# Patient Record
Sex: Female | Born: 1955 | Race: Black or African American | Hispanic: No | Smoking: Current some day smoker
Health system: Southern US, Community
[De-identification: ages and names within clinical notes are randomized; demographics above are authoritative.]

## PROBLEM LIST (undated history)

## (undated) DIAGNOSIS — R0609 Other forms of dyspnea: Secondary | ICD-10-CM

## (undated) DIAGNOSIS — Z8639 Personal history of other endocrine, nutritional and metabolic disease: Secondary | ICD-10-CM

## (undated) DIAGNOSIS — I10 Essential (primary) hypertension: Secondary | ICD-10-CM

## (undated) DIAGNOSIS — R0600 Dyspnea, unspecified: Secondary | ICD-10-CM

## (undated) DIAGNOSIS — E079 Disorder of thyroid, unspecified: Secondary | ICD-10-CM

## (undated) DIAGNOSIS — J4489 Other specified chronic obstructive pulmonary disease: Secondary | ICD-10-CM

## (undated) DIAGNOSIS — Z972 Presence of dental prosthetic device (complete) (partial): Secondary | ICD-10-CM

## (undated) DIAGNOSIS — J449 Chronic obstructive pulmonary disease, unspecified: Secondary | ICD-10-CM

## (undated) DIAGNOSIS — G4762 Sleep related leg cramps: Secondary | ICD-10-CM

## (undated) DIAGNOSIS — G4734 Idiopathic sleep related nonobstructive alveolar hypoventilation: Secondary | ICD-10-CM

## (undated) DIAGNOSIS — G2581 Restless legs syndrome: Principal | ICD-10-CM

## (undated) HISTORY — PX: APPENDECTOMY: SHX54

## (undated) HISTORY — DX: Sleep related leg cramps: G47.62

## (undated) HISTORY — DX: Restless legs syndrome: G25.81

## (undated) HISTORY — PX: ABDOMINAL HYSTERECTOMY: SHX81

---

## 2005-09-11 DIAGNOSIS — I10 Essential (primary) hypertension: Secondary | ICD-10-CM | POA: Insufficient documentation

## 2008-06-04 ENCOUNTER — Emergency Department (HOSPITAL_COMMUNITY): Admission: EM | Admit: 2008-06-04 | Discharge: 2008-06-04 | Payer: Self-pay | Admitting: Emergency Medicine

## 2008-08-18 ENCOUNTER — Emergency Department (HOSPITAL_COMMUNITY): Admission: EM | Admit: 2008-08-18 | Discharge: 2008-08-18 | Payer: Self-pay | Admitting: Emergency Medicine

## 2008-11-20 ENCOUNTER — Ambulatory Visit: Payer: Self-pay | Admitting: Nurse Practitioner

## 2008-11-20 DIAGNOSIS — F329 Major depressive disorder, single episode, unspecified: Secondary | ICD-10-CM

## 2008-11-20 DIAGNOSIS — J45909 Unspecified asthma, uncomplicated: Secondary | ICD-10-CM | POA: Insufficient documentation

## 2008-11-20 DIAGNOSIS — F17200 Nicotine dependence, unspecified, uncomplicated: Secondary | ICD-10-CM | POA: Insufficient documentation

## 2008-11-20 DIAGNOSIS — R252 Cramp and spasm: Secondary | ICD-10-CM

## 2008-11-20 LAB — CONVERTED CEMR LAB
Albumin: 4.2 g/dL (ref 3.5–5.2)
Alkaline Phosphatase: 84 units/L (ref 39–117)
BUN: 12 mg/dL (ref 6–23)
Basophils Absolute: 0 10*3/uL (ref 0.0–0.1)
Basophils Relative: 1 % (ref 0–1)
CO2: 23 meq/L (ref 19–32)
Cholesterol: 168 mg/dL (ref 0–200)
Eosinophils Relative: 5 % (ref 0–5)
Glucose, Bld: 83 mg/dL (ref 70–99)
HCT: 44.5 % (ref 39.0–52.0)
HDL: 53 mg/dL (ref >39)
Hemoglobin: 14.4 g/dL (ref 13.0–17.0)
LDL Cholesterol: 102 mg/dL — ABNORMAL HIGH (ref 0–99)
Lymphocytes Relative: 57 % — ABNORMAL HIGH (ref 12–46)
MCV: 89.9 fL (ref 78.0–100.0)
Monocytes Absolute: 0.4 10*3/uL (ref 0.1–1.0)
Monocytes Relative: 6 % (ref 3–12)
Platelets: 329 10*3/uL (ref 150–400)
Potassium: 3.9 meq/L (ref 3.5–5.3)
RDW: 13.1 % (ref 11.5–15.5)
Sodium: 140 meq/L (ref 135–145)
Total Protein: 6.8 g/dL (ref 6.0–8.3)
Triglycerides: 63 mg/dL (ref <150)

## 2008-11-23 ENCOUNTER — Ambulatory Visit: Payer: Self-pay | Admitting: *Deleted

## 2008-11-26 ENCOUNTER — Encounter (INDEPENDENT_AMBULATORY_CARE_PROVIDER_SITE_OTHER): Payer: Self-pay | Admitting: Nurse Practitioner

## 2008-12-04 ENCOUNTER — Ambulatory Visit: Payer: Self-pay | Admitting: Nurse Practitioner

## 2008-12-30 ENCOUNTER — Ambulatory Visit: Payer: Self-pay | Admitting: Nurse Practitioner

## 2009-02-26 ENCOUNTER — Telehealth (INDEPENDENT_AMBULATORY_CARE_PROVIDER_SITE_OTHER): Payer: Self-pay | Admitting: Nurse Practitioner

## 2009-03-01 ENCOUNTER — Ambulatory Visit: Payer: Self-pay | Admitting: Nurse Practitioner

## 2009-03-01 DIAGNOSIS — B351 Tinea unguium: Secondary | ICD-10-CM

## 2009-03-25 ENCOUNTER — Ambulatory Visit: Payer: Self-pay | Admitting: Nurse Practitioner

## 2009-03-25 ENCOUNTER — Encounter (INDEPENDENT_AMBULATORY_CARE_PROVIDER_SITE_OTHER): Payer: Self-pay | Admitting: Nurse Practitioner

## 2009-03-25 LAB — CONVERTED CEMR LAB
Cholesterol, target level: 200 mg/dL
GC Probe Amp, Genital: NEGATIVE
HDL goal, serum: 40 mg/dL
KOH Prep: NEGATIVE
LDL Goal: 130 mg/dL
Nitrite: NEGATIVE
OCCULT 1: NEGATIVE
Specific Gravity, Urine: 1.025
Urobilinogen, UA: 0.2
WBC Urine, dipstick: NEGATIVE

## 2009-04-01 ENCOUNTER — Ambulatory Visit (HOSPITAL_COMMUNITY): Admission: RE | Admit: 2009-04-01 | Discharge: 2009-04-01 | Payer: Self-pay | Admitting: Internal Medicine

## 2009-06-03 ENCOUNTER — Ambulatory Visit: Payer: Self-pay | Admitting: Nurse Practitioner

## 2009-06-21 ENCOUNTER — Ambulatory Visit: Payer: Self-pay | Admitting: Nurse Practitioner

## 2009-07-09 ENCOUNTER — Ambulatory Visit: Payer: Self-pay | Admitting: Nurse Practitioner

## 2009-07-16 ENCOUNTER — Telehealth (INDEPENDENT_AMBULATORY_CARE_PROVIDER_SITE_OTHER): Payer: Self-pay | Admitting: Nurse Practitioner

## 2009-07-26 ENCOUNTER — Emergency Department (HOSPITAL_COMMUNITY): Admission: EM | Admit: 2009-07-26 | Discharge: 2009-07-26 | Payer: Self-pay | Admitting: Emergency Medicine

## 2009-09-07 ENCOUNTER — Telehealth (INDEPENDENT_AMBULATORY_CARE_PROVIDER_SITE_OTHER): Payer: Self-pay | Admitting: Nurse Practitioner

## 2009-11-08 ENCOUNTER — Emergency Department (HOSPITAL_COMMUNITY): Admission: EM | Admit: 2009-11-08 | Discharge: 2009-11-08 | Payer: Self-pay | Admitting: Family Medicine

## 2009-11-08 ENCOUNTER — Telehealth (INDEPENDENT_AMBULATORY_CARE_PROVIDER_SITE_OTHER): Payer: Self-pay | Admitting: Nurse Practitioner

## 2009-11-12 ENCOUNTER — Ambulatory Visit: Payer: Self-pay | Admitting: Internal Medicine

## 2009-11-19 ENCOUNTER — Ambulatory Visit: Payer: Self-pay | Admitting: Internal Medicine

## 2009-11-19 ENCOUNTER — Encounter (INDEPENDENT_AMBULATORY_CARE_PROVIDER_SITE_OTHER): Payer: Self-pay | Admitting: Nurse Practitioner

## 2010-02-05 ENCOUNTER — Emergency Department (HOSPITAL_COMMUNITY): Admission: EM | Admit: 2010-02-05 | Discharge: 2010-02-05 | Payer: Self-pay | Admitting: Family Medicine

## 2010-04-06 ENCOUNTER — Ambulatory Visit: Payer: Self-pay | Admitting: Nurse Practitioner

## 2010-04-06 DIAGNOSIS — M65849 Other synovitis and tenosynovitis, unspecified hand: Secondary | ICD-10-CM

## 2010-04-06 DIAGNOSIS — M65839 Other synovitis and tenosynovitis, unspecified forearm: Secondary | ICD-10-CM | POA: Insufficient documentation

## 2010-04-07 ENCOUNTER — Ambulatory Visit (HOSPITAL_COMMUNITY): Admission: RE | Admit: 2010-04-07 | Discharge: 2010-04-07 | Payer: Self-pay | Admitting: Internal Medicine

## 2010-06-12 ENCOUNTER — Emergency Department (HOSPITAL_COMMUNITY): Admission: EM | Admit: 2010-06-12 | Discharge: 2010-06-12 | Payer: Self-pay | Admitting: Emergency Medicine

## 2010-06-13 ENCOUNTER — Telehealth (INDEPENDENT_AMBULATORY_CARE_PROVIDER_SITE_OTHER): Payer: Self-pay | Admitting: Nurse Practitioner

## 2010-06-14 ENCOUNTER — Emergency Department (HOSPITAL_COMMUNITY): Admission: EM | Admit: 2010-06-14 | Discharge: 2010-06-14 | Payer: Self-pay | Admitting: Family Medicine

## 2010-06-14 ENCOUNTER — Emergency Department (HOSPITAL_COMMUNITY): Admission: EM | Admit: 2010-06-14 | Discharge: 2010-06-15 | Payer: Self-pay | Admitting: Emergency Medicine

## 2010-06-16 ENCOUNTER — Ambulatory Visit: Payer: Self-pay | Admitting: Nurse Practitioner

## 2010-06-16 DIAGNOSIS — M436 Torticollis: Secondary | ICD-10-CM | POA: Insufficient documentation

## 2010-06-16 DIAGNOSIS — S91309A Unspecified open wound, unspecified foot, initial encounter: Secondary | ICD-10-CM | POA: Insufficient documentation

## 2010-06-22 ENCOUNTER — Emergency Department (HOSPITAL_COMMUNITY): Admission: EM | Admit: 2010-06-22 | Discharge: 2010-06-22 | Payer: Self-pay | Admitting: Emergency Medicine

## 2010-06-23 ENCOUNTER — Telehealth (INDEPENDENT_AMBULATORY_CARE_PROVIDER_SITE_OTHER): Payer: Self-pay | Admitting: Nurse Practitioner

## 2010-06-24 ENCOUNTER — Telehealth (INDEPENDENT_AMBULATORY_CARE_PROVIDER_SITE_OTHER): Payer: Self-pay | Admitting: Nurse Practitioner

## 2010-06-25 ENCOUNTER — Encounter (INDEPENDENT_AMBULATORY_CARE_PROVIDER_SITE_OTHER): Payer: Self-pay | Admitting: Nurse Practitioner

## 2010-06-30 ENCOUNTER — Ambulatory Visit: Payer: Self-pay | Admitting: Nurse Practitioner

## 2010-06-30 DIAGNOSIS — F341 Dysthymic disorder: Secondary | ICD-10-CM | POA: Insufficient documentation

## 2010-06-30 DIAGNOSIS — R569 Unspecified convulsions: Secondary | ICD-10-CM | POA: Insufficient documentation

## 2010-07-07 ENCOUNTER — Ambulatory Visit: Payer: Self-pay | Admitting: Nurse Practitioner

## 2010-07-26 ENCOUNTER — Telehealth (INDEPENDENT_AMBULATORY_CARE_PROVIDER_SITE_OTHER): Payer: Self-pay | Admitting: Nurse Practitioner

## 2010-07-29 ENCOUNTER — Ambulatory Visit: Payer: Self-pay | Admitting: Nurse Practitioner

## 2010-07-29 ENCOUNTER — Telehealth (INDEPENDENT_AMBULATORY_CARE_PROVIDER_SITE_OTHER): Payer: Self-pay | Admitting: Nurse Practitioner

## 2010-07-29 DIAGNOSIS — J209 Acute bronchitis, unspecified: Secondary | ICD-10-CM

## 2010-08-18 ENCOUNTER — Ambulatory Visit: Payer: Self-pay | Admitting: Nurse Practitioner

## 2010-10-11 NOTE — Letter (Signed)
Summary: [facility]  [facility]   Imported By: Arta Bruce 06/29/2010 11:37:16  _____________________________________________________________________  External Attachment:    Type:   Image     Comment:   External Document

## 2010-10-11 NOTE — Letter (Signed)
Summary: [facility] AND SCHOOL EXCUSE  [facility] AND S[facility]OL EXCUSE   Imported By: Arta Bruce 01/17/2010 16:25:12  _____________________________________________________________________  External Attachment:    Type:   Image     Comment:   External Document

## 2010-10-11 NOTE — Assessment & Plan Note (Signed)
Summary: Left Wrist tendinitis   Vital Signs:  Patient profile:   55 year old female Menstrual status:  hysterectomy Weight:      205.4 pounds BMI:     35.38 Temp:     98.2 degrees F oral Pulse rate:   78 / minute Pulse rhythm:   regular Resp:     20 per minute BP sitting:   169 / 94  (left arm) Cuff size:   regular  Vitals Entered By: Levon Hedger (April 06, 2010 9:32 AM) CC: left hand painful feels like it has a knot in it x 3 months...pt states she keeps getting a water blister on big toes that leaves and comes back x 1 week, Hypertension Management Is Patient Diabetic? No Pain Assessment Patient in pain? yes     Location: hand Intensity: 9 Onset of pain  Constant  Does patient need assistance? Functional Status Self care Ambulation Normal   CC:  left hand painful feels like it has a knot in it x 3 months...pt states she keeps getting a water blister on big toes that leaves and comes back x 1 week and Hypertension Management.  History of Present Illness:  Pt into the office with complaints of pain in her left hand. Pain started 3 months ago. She went to the Urgent Care about 1-2 months ago and was dx with a wrist sprain.  She was ordered ibuprofen which did not help.  Pt was not given a splint. Pt is employed at [employer] and pt was concerned back in April that she had injuried her wrist at work.  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 Pt is right handed. She notices pain more when she tries to sleep at night or when she goes to pick up an object Fingers are not affected.    Hypertension History:      She denies headache, chest pain, and palpitations.  She notes no problems with any antihypertensive medication side effects.        Positive major cardiovascular risk factors include hypertension and current tobacco user.  Negative major cardiovascular risk factors include female age less than 49 years old.        Further assessment  for target organ damage reveals no history of ASHD, cardiac end-organ damage (CHF/LVH), stroke/TIA, peripheral vascular disease, renal insufficiency, or hypertensive retinopathy.     Habits & Providers  Alcohol-Tobacco-Diet     Alcohol drinks/day: 0     Tobacco Status: current     Tobacco Counseling: to quit use of tobacco products     Year Started: 2007  Exercise-Depression-Behavior     Does Patient Exercise: no     Exercise Counseling: to improve exercise regimen     STD Risk: past     STD Risk Counseling: to avoid increased STD risk     Contraception Counseling: not indicated; no questions/concerns expressed     Drug Use: never     Seat Belt Use: always     Sun Exposure: frequent  Comments: Pt quit smoking for 3 months then her brother died and she restarrted.  Allergies (verified): No Known Drug Allergies  Social History: STD Risk:  past  Review of Systems CV:  Denies chest pain or discomfort. Resp:  Denies cough. GI:  Denies abdominal pain, nausea, and vomiting. MS:  Complains of joint pain and cramps; nortriptiline 10mg  is no longer helpful for cramps.  Physical Exam  General:  alert.   Head:  normocephalic.  Lungs:  scattered rhonchi Heart:  normal rate and regular rhythm.   Msk:  up to the exam table Neurologic:  alert & oriented X3.   Psych:  Oriented X3.     Wrist/Hand Exam  Hand Exam:    Right:    Inspection:  Normal       Location:  5th metacarpal    Tenderness:  5th metacarpal    Swelling:  5th metacarpal    Erythema:  no   Impression & Recommendations:  Problem # 1:  TENDINITIS, LEFT WRIST (ICD-727.05) advised pt to start wearing left wrist splint (she already has) start taking aniti-inflammatories  Problem # 2:  LEG CRAMPS (ICD-729.82) advised pt to increase medications to 25mg  by mouth night  Problem # 3:  TOBACCO ABUSE (ICD-305.1) advised cessation  Problem # 4:  HYPERTENSION, BENIGN ESSENTIAL (ICD-401.1) BP elevated today but  pt has not taken meds will have her return for triage visit Her updated medication list for this problem includes:    Metoprolol Succinate 50 Mg Xr24h-tab (Metoprolol succinate) .Marland Kitchen... 1 tablet by mouth daily for blood pressure    Hydrochlorothiazide 25 Mg Tabs (Hydrochlorothiazide) .Marland Kitchen... Take one (1) by mouth daily for blood pressure  Complete Medication List: 1)  Combivent 103-18 Mcg/act Aero (Ipratropium-albuterol) .... 2 puffs every 6 hours for shortness of breath 2)  Metoprolol Succinate 50 Mg Xr24h-tab (Metoprolol succinate) .Marland Kitchen.. 1 tablet by mouth daily for blood pressure 3)  Hydrochlorothiazide 25 Mg Tabs (Hydrochlorothiazide) .... Take one (1) by mouth daily for blood pressure 4)  Lexapro 10 Mg Tabs (Escitalopram oxalate) .... One tablet by mouth daily 5)  Advair Diskus 100-50 Mcg/dose Aepb (Fluticasone-salmeterol) .... One inhalation two times a day 6)  Nortriptyline Hcl 25 Mg Caps (Nortriptyline hcl) .... One capsule by mouth nightly as needed for cramps 7)  Loratadine 10 Mg Tabs (Loratadine) .... One tablet by mouth daily 8)  Fluticasone Propionate 50 Mcg/act Susp (Fluticasone propionate) .... One spray in each nostril two times a day (hold head down) 9)  Albuterol Sulfate (2.5 Mg/80ml) 0.083% Nebu (Albuterol sulfate) .... Nebulize every 4 hours as needed--pt. has medication from a previous provider 10)  Aleve 220 Mg Tabs (Naproxen sodium) .... 2 tablets by mouth daily for 3 days then as needed 11)  Naprosyn 500 Mg Tabs (Naproxen) .... One tablet by mouth two times a day as needed for pain  Hypertension Assessment/Plan:      The patient's hypertensive risk group is category B: At least one risk factor (excluding diabetes) with no target organ damage.  Her calculated 10 year risk of coronary heart disease is 15 %.  Today's blood pressure is 169/94.  Her blood pressure goal is < 140/90.  Patient Instructions: 1)  Leg cramps - You can take nortriptyline 10mg  - 2 capsules by mouth nightly  for cramps.  Increase nortriptyline to 25mg  by mouth nightly as needed for cramps. 2)  Blood pressure - high today. 3)  Left wrist - likely tendinitis.  The tendon in your left wrist is inflammed.  This is likely due to overuse over long periods of time such as at work.  WEAR YOUR WRIST SPLINT. 4)  take aleve - 2 tablets by mouth daily for 3 days THEN get naprosyn 500mg  by mouth two times a day as needed for pain (take with food) 5)  Schedule a nurse visit with the Triage Nurse - Aggie Cosier in 2-3 weeks for a blood pressure check. You should take your blood pressure medication  before this visit.   6)  Blood pressure goal: < 140/85 7)  If higher than this will need medication adjustment. Prescriptions: NAPROSYN 500 MG TABS (NAPROXEN) One tablet by mouth two times a day as needed for pain  #50 x 0   Entered and Authorized by:   Lehman Prom FNP   Signed by:   Lehman Prom FNP on 04/06/2010   Method used:   Print then Give to Patient   RxID:   [id number] ALEVE 220 MG TABS (NAPROXEN SODIUM) 2 tablets by mouth daily for 3 days then as needed  #6 x 0   Entered and Authorized by:   Lehman Prom FNP   Signed by:   Lehman Prom FNP on 04/06/2010   Method used:   Samples Given   RxID:   [id number] NORTRIPTYLINE HCL 25 MG CAPS (NORTRIPTYLINE HCL) One capsule by mouth nightly as needed for cramps  #30 x 0   Entered and Authorized by:   Lehman Prom FNP   Signed by:   Lehman Prom FNP on 04/06/2010   Method used:   Print then Give to Patient   RxID:   [id number]

## 2010-10-11 NOTE — Letter (Signed)
Summary: Handout Printed  Printed Handout:  - Tendinitis

## 2010-10-11 NOTE — Letter (Signed)
Summary: Work Excuse  [facility]  [address] [place], [place] [address]   Phone: [phone]  Fax: [phone]    Today's Date: June 16, 2010  Name of Patient: [name]  The above named patient had a medical visit today   Please take this into consideration when reviewing the time away from work  Special Instructions:  [  ] None  [ X] To be off the remainder of today, returning to the normal work  schedule Sunday October 9th, 2011.  [  ] To be off until the next scheduled appointment on ______________________.  [  ] Other ________________________________________________________________ ________________________________________________________________________   Sincerely yours,   Lehman Prom FNP

## 2010-10-11 NOTE — Progress Notes (Signed)
Summary: Cough and other symptoms persist  Phone Note Call from Patient   Summary of Call: Still has cough, coughing up yellowish mucus, some blood noted.  States she's not feeling any better at all.  Did all home management therapies suggested with no relief.  Vomiting stopped, having loose stools again.  Having fever only at night. Wants to be seen.  Advised that there are no appointments available; name is on list to be called if one opens up.  To continue with home remedies in the meantime.  If symptoms worsen, to consider UC for evaluation and treatment. Initial call taken by: Dutch Quint RN,  July 29, 2010 12:10 PM  Follow-up for Phone Call        In office for appointment.  Dutch Quint RN  July 29, 2010 3:12 PM

## 2010-10-11 NOTE — Assessment & Plan Note (Signed)
Summary: F/u ER visit   History of Present Illness:  Pt into the office for f/u on ER visit on 06/25/2010. Pt presents today with a family member who states they became concerned with pt's erratic behavior so they took her to the ER. Pt was dx with pseudoseizures and was advised to contact the [facility].    Depression History:      The patient is having a depressed mood most of the day and has a diminished interest in her usual daily activities.  Positive alarm features for depression include insomnia and fatigue (loss of energy).  However, she denies recurrent thoughts of death or suicide.        Psychosocial stress factors include the recent death of a loved one and major life changes.  The patient denies that she feels like life is not worth living, denies that she wishes that she were dead, and denies that she has thought about ending her life.         Depression Treatment History:  Prior Medication Used:   Start Date: Assessment of Effect:   Comments:  lexapro      11/20/2008   some improvement     increase dose  Hypertension History:      Positive major cardiovascular risk factors include hypertension and current tobacco user.  Negative major cardiovascular risk factors include female age less than 42 years old.        Further assessment for target organ damage reveals no history of ASHD, cardiac end-organ damage (CHF/LVH), stroke/TIA, peripheral vascular disease, renal insufficiency, or hypertensive retinopathy.      Allergies: No Known Drug Allergies  Review of Systems CV:  Denies chest pain or discomfort. Resp:  Denies cough. GI:  Denies abdominal pain, nausea, and vomiting. Neuro:  Complains of seizures; Family reports faining episodes and pt shakes uncontrollable.  Marland Kitchen  Physical Exam  General:  alert.   Head:  normocephalic.   Neck:  ROM intact Heart:  normal rate and regular rhythm.   Neurologic:  alert & oriented X3.   Psych:  jerking movement of neck with  ROM Rest of body not affected   Impression & Recommendations:  Problem # 1:  OTHER CONVULSIONS (ICD-780.39) advised pt to stay out of work until mental health evaluation done she will see Aquilla Solian in this office on next week Valium nightly for rest and relaxation   Problem # 2:  DEPRESSION/ANXIETY (ICD-300.4) increase lexapro to 20mg  by mouth daily Orders: Misc. Referral (Misc. Ref)  Complete Medication List: 1)  Combivent 103-18 Mcg/act Aero (Ipratropium-albuterol) .... 2 puffs every 6 hours for shortness of breath 2)  Metoprolol Succinate 50 Mg Xr24h-tab (Metoprolol succinate) .Marland Kitchen.. 1 tablet by mouth daily for blood pressure 3)  Hydrochlorothiazide 25 Mg Tabs (Hydrochlorothiazide) .... Take one (1) by mouth daily for blood pressure 4)  Lexapro 10 Mg Tabs (Escitalopram oxalate) .... Two tablets by mouth daily 5)  Advair Diskus 100-50 Mcg/dose Aepb (Fluticasone-salmeterol) .... One inhalation two times a day 6)  Nortriptyline Hcl 25 Mg Caps (Nortriptyline hcl) .... One capsule by mouth nightly as needed for cramps 7)  Loratadine 10 Mg Tabs (Loratadine) .... One tablet by mouth daily 8)  Fluticasone Propionate 50 Mcg/act Susp (Fluticasone propionate) .... One spray in each nostril two times a day (hold head down) 9)  Albuterol Sulfate (2.5 Mg/40ml) 0.083% Nebu (Albuterol sulfate) .... Nebulize every 4 hours as needed--pt. has medication from a previous provider 10)  Aleve 220 Mg Tabs (Naproxen  sodium) .... 2 tablets by mouth daily for 3 days then as needed 11)  Naprosyn 500 Mg Tabs (Naproxen) .... One tablet by mouth two times a day as needed for pain 12)  Diazepam 5 Mg Tabs (Diazepam) .... One tablet by mouth nightly for nerves  Hypertension Assessment/Plan:      The patient's hypertensive risk group is category B: At least one risk factor (excluding diabetes) with no target organ damage.  Her calculated 10 year risk of coronary heart disease is 9 %.  Her blood pressure goal is <  140/90.  Patient Instructions: 1)  Schedule an appointment with Aquilla Solian (see if you can get an appointment next week) 2)  Increase lexapro to 20mg  by mouth daily. 3)  If you start shaking then take some of the muscle relaxer. Prescriptions: DIAZEPAM 5 MG TABS (DIAZEPAM) One tablet by mouth nightly for nerves  #10 x 0   Entered and Authorized by:   Lehman Prom FNP   Signed by:   Lehman Prom FNP on 06/30/2010   Method used:   Print then Give to Patient   RxID:   [id number]    Orders Added: 1)  Est. Patient Level III [21308] 2)  Misc. Referral [Misc. Ref]

## 2010-10-11 NOTE — Progress Notes (Signed)
Summary: **FLU SYMPTOMS**  Phone Note Call from Patient Call back at [phone]   Summary of Call: pt has been sick since yesterday. pt c/o cough and fever 100.1. she is not bringing anything up..c/o cold and hot chills, body aches, has taken otc meds but no relief. pt uses [facility] .  patient states she is not sure if its sinus infection but she is not having any sinus pressure or nasal drainage at this time. Initial call taken by: Mikey College CMA,  November 08, 2009 2:35 PM  Follow-up for Phone Call        pt should take tylenol alternating with ibuprofen for aches and fever She should take coricidan HBP for cough given her high blood pressure (this is over the counter no Rx needed) No need for Rx plenty of rest, liquids - soup, gatorade for hydration - appetite is likely gone so she needs to avoid dehydration contagious so she will need to isolate herself in a separate room as to not spread infection  Follow-up by: Lehman Prom FNP,  November 08, 2009 3:03 PM  Additional Follow-up for Phone Call Additional follow up Details #1::        Levon Hedger  November 08, 2009 4:06 PM Called [phone] left message on machine for pt to return call to the office.  Levon Hedger  November 09, 2009 2:34 PM called [phone] left message with pt's daughter to return call to the office  Levon Hedger  November 10, 2009 12:22 PM Spoke with pt she says that she went to [facility] urgent care on Monday she was diagnosed with the flu and is being treated.  I shared with her to call us when she is feeling better or if she get worse to follow up with her to make sure she is over her illness.  Will pull up note from E-charts.    Additional Follow-up for Phone Call Additional follow up Details #2::    I see that pt has been to the ER and was treated for influenza virus. she should take meds as advised.  Meds will not CURE pt but only shorten the length of illness Avoid contact with others while she is  sick plenty of fluids to maintain hydration Follow-up by: Lehman Prom FNP,  November 11, 2009 8:32 AM  Additional Follow-up for Phone Call Additional follow up Details #3:: Details for Additional Follow-up Action Taken: pt informed. Additional Follow-up by: Levon Hedger,  November 11, 2009 4:48 PM

## 2010-10-11 NOTE — Assessment & Plan Note (Signed)
Summary: 1 week Follow up   Vital Signs:  Patient profile:   55 year old female Menstrual status:  hysterectomy Weight:      181.1 pounds BMI:     31.20 Temp:     97.3 degrees F oral Pulse rate:   80 / minute Pulse rhythm:   regular Resp:     20 per minute BP sitting:   130 / 70  (left arm) Cuff size:   regular  Vitals Entered By: Levon Hedger (July 07, 2010 3:14 PM)  Nutrition Counseling: Patient's BMI is greater than 25 and therefore counseled on weight management options. CC: follow-up visit...still shaking, Hypertension Management Is Patient Diabetic? No Pain Assessment Patient in pain? no       Does patient need assistance? Functional Status Self care Ambulation Normal   CC:  follow-up visit...still shaking and Hypertension Management.  History of Present Illness:  Pt into the office for 1 week follow up.  Pseudoseizure - Pt was seen in this office on last week for ER f/u for pseudoseizures. She was advised to rest and relax. She also takes valium at night which she also reports makes her very sleepy but relaxers her. Lexapro 10mg  was increased 2 tablets by mouth nightly.  Pt was not able to tolerate due to sedation. Full workup done at [facility] Pt's daughter has epilepsy  Social - pt reports that she was displaced from her job today due to lack of attendance for the past few weeks due to current illness.  Hypertension History:      She denies headache, chest pain, and palpitations.  She notes no problems with any antihypertensive medication side effects.        Positive major cardiovascular risk factors include hypertension and current tobacco user.  Negative major cardiovascular risk factors include female age less than 68 years old.        Further assessment for target organ damage reveals no history of ASHD, cardiac end-organ damage (CHF/LVH), stroke/TIA, peripheral vascular disease, renal insufficiency, or hypertensive retinopathy.      Allergies: No Known Drug Allergies  Review of Systems General:  Denies fever. CV:  Denies chest pain or discomfort. Resp:  Denies cough. GI:  Denies abdominal pain, nausea, and vomiting. Neuro:  Denies seizures; none since last visit.  Physical Exam  General:  alert.   Head:  normocephalic.   Lungs:  normal breath sounds.   Heart:  normal rate and regular rhythm.   Abdomen:  normal bowel sounds.   Msk:  up to the exam table without assist Neurologic:  alert & oriented X3.   no tremor activity noted with movement   Impression & Recommendations:  Problem # 1:  DEPRESSION/ANXIETY (ICD-300.4) pt spoke with Aquilla Solian today she will schedule some counseling sessions  Problem # 2:  HYPERTENSION, BENIGN ESSENTIAL (ICD-401.1) BP is doing well. Continue current medications Her updated medication list for this problem includes:    Metoprolol Succinate 50 Mg Xr24h-tab (Metoprolol succinate) .Marland Kitchen... 1 tablet by mouth daily for blood pressure    Hydrochlorothiazide 25 Mg Tabs (Hydrochlorothiazide) .Marland Kitchen... Take one (1) by mouth daily for blood pressure  Problem # 3:  OTHER CONVULSIONS (ICD-780.39) "pseudoseizures " dx per [facility]   Problem # 4:  TOBACCO ABUSE (ICD-305.1) pt advised cessation  Complete Medication List: 1)  Combivent 103-18 Mcg/act Aero (Ipratropium-albuterol) .... 2 puffs every 6 hours for shortness of breath 2)  Metoprolol Succinate 50 Mg Xr24h-tab (Metoprolol succinate) .Marland Kitchen.. 1 tablet by  mouth daily for blood pressure 3)  Hydrochlorothiazide 25 Mg Tabs (Hydrochlorothiazide) .... Take one (1) by mouth daily for blood pressure 4)  Lexapro 10 Mg Tabs (Escitalopram oxalate) .... Two tablets by mouth daily 5)  Advair Diskus 100-50 Mcg/dose Aepb (Fluticasone-salmeterol) .... One inhalation two times a day 6)  Nortriptyline Hcl 25 Mg Caps (Nortriptyline hcl) .... One capsule by mouth nightly as needed for cramps 7)  Loratadine 10 Mg Tabs (Loratadine) .... One  tablet by mouth daily 8)  Fluticasone Propionate 50 Mcg/act Susp (Fluticasone propionate) .... One spray in each nostril two times a day (hold head down) 9)  Albuterol Sulfate (2.5 Mg/67ml) 0.083% Nebu (Albuterol sulfate) .... Nebulize every 4 hours as needed--pt. has medication from a previous provider 10)  Aleve 220 Mg Tabs (Naproxen sodium) .... 2 tablets by mouth daily for 3 days then as needed 11)  Naprosyn 500 Mg Tabs (Naproxen) .... One tablet by mouth two times a day as needed for pain 12)  Diazepam 5 Mg Tabs (Diazepam) .... One tablet by mouth nightly for nerves  Hypertension Assessment/Plan:      The patient's hypertensive risk group is category B: At least one risk factor (excluding diabetes) with no target organ damage.  Her calculated 10 year risk of coronary heart disease is 9 %.  Today's blood pressure is 130/70.  Her blood pressure goal is < 140/90.  Patient Instructions: 1)  Schedule sessions with Aquilla Solian 2)  Drink plenty of water 3)  Eat small meals per day  4)  Plenty of fruit and veges. 5)  Avoid junk food. 6)  Continue to take Lexapro 10mg  by mouth nightly 7)  May take the valium as needed at night for muscles 8)  Schedule another appointment for eligibility since your are now unemployed. 9)  Follow up with n.martin,fnp in 6 weeks for muscles or sooner if symptoms worsen.    Orders Added: 1)  Est. Patient Level III [33295]

## 2010-10-11 NOTE — Progress Notes (Signed)
Summary: talk to Nurse   Phone Note Call from Patient Call back at [phone]   Summary of Call: Pt wants to talk to the nurse cause she went to the hospital last night and I offer her an appt for 10-20 and she sid is to long . Please, call her @ [phone] Initial call taken by: Cheryll Dessert,  June 23, 2010 8:17 AM  Follow-up for Phone Call        daughter Sherry R[facility]ing [facility] by the office and brought paperwork with her that Ms Deak needs a referral to [facility] because she was being seen in the ED multiple times. She was diagnosed with Torticollis. Last seen in ED October 6.  Follow-up by: Hassell Halim CMA,  June 23, 2010 4:09 PM  Additional Follow-up for Phone Call Additional follow up Details #1::        MS Rosner CALLED AGAIN, TO SEE IF SHS CAN GET IN TO BE SEEN BECAUSE SHE ALSO HAS BEEN OUT OF WORK SINCE NIGHT [place]GFORE LAST AND IS CONCERNED BECAUSE SHE IS NEEDING A WORK EXCUSE. SHE IS HAVING A LOT OF SHAKING THAT WILL SOMTIMES LAST ABIOUT 30 MIN. Additional Follow-up by: Leodis Rains,  June 24, 2010 10:20 AM    Additional Follow-up for Phone Call Additional follow up Details #2::    see phone note dated 06/23/2010  Follow-up by: Lehman Prom FNP,  June 28, 2010 9:39 AM

## 2010-10-11 NOTE — Progress Notes (Signed)
Summary:  right side of body shaking  Phone Note Call from Patient Call back at [phone]   Summary of Call: [name] CALLED AND SAYS THAT YESTERDAY AT [facility], SHE STARTED SHAKING, BUT DID NOT PASS OUT, SO THEY TOOK HER TO [facility], AND WAS TOLD BY THEM THAT HER BP WAS A LITTLE HIGH AND IT WAS HER NERVES. SHE WAS GIVEN SOMETHING IN HER IV,  BUT IT DIDN'T HELP, AND SHE IS STILL SHAKING. ALL THIS IS ON HER RIGHT SIDE OF HER BODY. AND SHE  Initial call taken by: Leodis Rains,  June 13, 2010 2:14 PM  Follow-up for Phone Call        Unable to complete call as dialed on number given; home number has no answer.  Dutch Quint RN  June 13, 2010 3:26 PM  Left message on answering machine for pt. to return call.  Dutch Quint RN  June 14, 2010 2:24 PM  Left message on answering machine for pt. to return call.  Dutch Quint RN  June 16, 2010 10:32 AM   Additional Follow-up for Phone Call Additional follow up Details #1::        Got sick in church on Sunday.  Went to [facility] UC then went to [facility] ED.  Took lorazepam as ordered.  Started getting worse by Monday, was still feeling bad.  She went back to the UC on Wednesday and got sent back to the ED again who gave her diazepam and ibuprofen 600 mg.  Feeling a lot better today, but needs to be seen to be able to return to work.  Appt. made for today. Additional Follow-up by: Dutch Quint RN,  June 16, 2010 10:48 AM    Additional Follow-up for Phone Call Additional follow up Details #2::    pt into the office today Follow-up by: Lehman Prom FNP,  June 16, 2010 2:14 PM

## 2010-10-11 NOTE — Assessment & Plan Note (Signed)
Summary: F/U ER   Vital Signs:  Patient profile:   55 year old female Menstrual status:  hysterectomy Weight:      193.5 pounds BMI:     33.33 O2 Sat:      100 % on Room air Temp:     97.0 degrees F oral Pulse rate:   76 / minute Pulse rhythm:   regular Resp:     20 per minute BP sitting:   130 / 80  (left arm) Cuff size:   regular  Vitals Entered By: Levon Hedger (June 16, 2010 12:52 PM)  Nutrition Counseling: Patient's BMI is greater than 25 and therefore counseled on weight management options.  O2 Flow:  Room air CC: [facility] hospital f/u muscle spasms.Marland Kitchennerve issues, Hypertension Management Is Patient Diabetic? No Pain Assessment Patient in pain? yes     Location: neck  Does patient need assistance? Functional Status Self care Ambulation Normal   CC:  [facility] hospital f/u muscle spasms.Marland Kitchennerve issues and Hypertension Management.  History of Present Illness:  Pt into the office for f/u on ER visit. (full ER visit reviewed)  5 days ago she started shaking uncontrollable on her right side. She was transported to the ER and pt was dx with lorazepam.  She took for 2 days and pt was no better. She then returned to the ER because right side "shakes" continued. She was started diazepam 5mg  by mouth and is taking every 6 hours.   She was also given an Rx for ibuprofen. She was also advised to use a warm compress to the affected area.   Right great toe - sore has progressed over the past 1 week wears steel toe boots to work has been soaking foot in epson salt and then applying peroxide without resolution of symptoms   Hypertension History:      She denies headache, chest pain, and palpitations.  She notes no problems with any antihypertensive medication side effects.        Positive major cardiovascular risk factors include hypertension and current tobacco user.  Negative major cardiovascular risk factors include female age less than 53 years old.    Further assessment for target organ damage reveals no history of ASHD, cardiac end-organ damage (CHF/LVH), stroke/TIA, peripheral vascular disease, renal insufficiency, or hypertensive retinopathy.    Allergies (verified): No Known Drug Allergies  Review of Systems Resp:  Denies cough. GI:  Denies abdominal pain, nausea, and vomiting. MS:  Complains of stiffness; right neck. Derm:  Complains of itching; right toe.  Physical Exam  General:  alert.   Head:  normocephalic.   Neck:  tenderness with palpation of right trapezius ROM slow Lungs:  normal breath sounds.   Heart:  normal rate and regular rhythm.   Abdomen:  normal bowel sounds.   Msk:  up to the exam table Neurologic:  alert & oriented X3.   Skin:  right great toe - wound with sloughing skin and pink fleshy inside wound Psych:  Oriented X3.     Impression & Recommendations:  Problem # 1:  TORTICOLLIS (ICD-723.5) handout given  advised pt to use the anti-inflammatories, apply heat pad may take valium as prescribed by the ER as needed   Problem # 2:  WOUND, FOOT (ICD-892.0) concerning advised pt that she needs to get inserts for steel toe boots need to wear supportive socks. do not apply peroxide to the wound  Problem # 3:  NEED PROPHYLACTIC VACCINATION&INOCULATION FLU (ICD-V04.81) given in office  today  Problem # 4:  HYPERTENSION, BENIGN ESSENTIAL (ICD-401.1)  Her updated medication list for this problem includes:    Metoprolol Succinate 50 Mg Xr24h-tab (Metoprolol succinate) .Marland Kitchen... 1 tablet by mouth daily for blood pressure    Hydrochlorothiazide 25 Mg Tabs (Hydrochlorothiazide) .Marland Kitchen... Take one (1) by mouth daily for blood pressure  Complete Medication List: 1)  Combivent 103-18 Mcg/act Aero (Ipratropium-albuterol) .... 2 puffs every 6 hours for shortness of breath 2)  Metoprolol Succinate 50 Mg Xr24h-tab (Metoprolol succinate) .Marland Kitchen.. 1 tablet by mouth daily for blood pressure 3)  Hydrochlorothiazide 25 Mg Tabs  (Hydrochlorothiazide) .... Take one (1) by mouth daily for blood pressure 4)  Lexapro 10 Mg Tabs (Escitalopram oxalate) .... One tablet by mouth daily 5)  Advair Diskus 100-50 Mcg/dose Aepb (Fluticasone-salmeterol) .... One inhalation two times a day 6)  Nortriptyline Hcl 25 Mg Caps (Nortriptyline hcl) .... One capsule by mouth nightly as needed for cramps 7)  Loratadine 10 Mg Tabs (Loratadine) .... One tablet by mouth daily 8)  Fluticasone Propionate 50 Mcg/act Susp (Fluticasone propionate) .... One spray in each nostril two times a day (hold head down) 9)  Albuterol Sulfate (2.5 Mg/84ml) 0.083% Nebu (Albuterol sulfate) .... Nebulize every 4 hours as needed--pt. has medication from a previous provider 10)  Aleve 220 Mg Tabs (Naproxen sodium) .... 2 tablets by mouth daily for 3 days then as needed 11)  Naprosyn 500 Mg Tabs (Naproxen) .... One tablet by mouth two times a day as needed for pain 12)  Cephalexin 500 Mg Caps (Cephalexin) .... One capsule by mouth three times a day  Other Orders: Flu Vaccine 71yrs + (54098) Admin 1st Vaccine (11914) Admin 1st Vaccine [facility]) [phone])  Hypertension Assessment/Plan:      The patient's hypertensive risk group is category B: At least one risk factor (excluding diabetes) with no target organ damage.  Her calculated 10 year risk of coronary heart disease is 9 %.  Today's blood pressure is 130/80.  Her blood pressure goal is < 140/90.   Patient Instructions: 1)  Buy inserts for your shoes to prevent wound. 2)  Take keflex 500mg  by mouth three times a day for infection.  You can get this from [facility]. 3)  Neck pain - take the iburprofen with breakfast, lunch and dinner and use the heat pad.  4)  You have received the flu vaccine today. 5)  You may return to work on October 9th, 2011 Prescriptions: CEPHALEXIN 500 MG CAPS (CEPHALEXIN) One capsule by mouth three times a day  #30 x 0   Entered and Authorized by:   Lehman Prom FNP   Signed by:   Lehman Prom FNP on 06/16/2010   Method used:   Print then Give to Patient   RxID:   [id number]    Influenza Vaccine    Vaccine Type: Fluvax 3+    Site: left deltoid    Mfr: GlaxoSmithKline    Dose: 0.5 ml    Route: IM    Given by: Levon Hedger    Exp. Date: 02/2011    Lot #: [id number]    VIS given: 04/05/10 version given June 16, 2010.  Flu Vaccine Consent Questions    Do you have a history of severe allergic reactions to this vaccine? no    Any prior history of allergic reactions to egg and/or gelatin? no    Do you have a sensitivity to the preservative Thimersol? no    Do you have a past history  of Guillan-Barre Syndrome? no    Do you currently have an acute febrile illness? no    Have you ever had a severe reaction to latex? no    Vaccine information given and explained to patient? yes    Are you currently pregnant? 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

## 2010-10-11 NOTE — Progress Notes (Signed)
Summary: NEEDS MEDS  Phone Note Call from Patient Call back at [phone]   Summary of Call: PLEASE CALL PT NEED SOMETHING FOR BAD COLD, SHE NEEDS MEDS Initial call taken by: Domenic Polite,  July 26, 2010 9:53 AM  Follow-up for Phone Call        PT STATES HAS BEEN GOING ON SINCE SUNDA, PT HAS USED OTC MEDS NO RELIEF, STATES HAVING BODY PAINS, NO FEVER, NOT ABLE TO HOLD ANYTHING DOWN ON STOMACH.(PT USES [facility]) Follow-up by: Hassell Halim CMA,  July 27, 2010 8:34 AM  Additional Follow-up for Phone Call Additional follow up Details #1::        Has been vomiting since yesterday, started getting sick on Sunday.  States having diarrhea about twice a day, vomited three times yesterday, none today.   Everything she eats is coming back up.  Is getting hot and cold at night, still having body ache.  Is coughing, productive of yellow mucus.  Denies sore throat, having some difficulty breathing.   Denies sinus pressure or chest congestion.  Nose is draining yellow mucus.  Denies passing blood in emesis or stool.  Has not been taking anything for cough/nasal discharge.  Advised per protocol for home management of vomiting/diarrhea and cold.  Advised re clear liquids only x24 hours with progression to [employer] as tolerated.  To follow up if symptoms persist or worsen, to take Tylenol or alleve for body aches and fever, Coricidan for cough and to humidify air in home to assist with persistent cough.  Reminded re good hand hygiene -- to call back tomorrow to follow-up re symptoms.     Additional Follow-up by: Dutch Quint RN,  July 27, 2010 9:38 AM    Additional Follow-up for Phone Call Additional follow up Details #2::    noted Follow-up by: Lehman Prom FNP,  July 27, 2010 12:31 PM

## 2010-10-11 NOTE — Assessment & Plan Note (Signed)
Summary: Bronchitis   Vital Signs:  Patient profile:   55 year old female Menstrual status:  hysterectomy Weight:      177.9 pounds BMI:     30.65 O2 Sat:      94 % on Room air Temp:     97.9 degrees F oral Pulse rate:   98 / minute Pulse rhythm:   regular Resp:     20 per minute BP sitting:   130 / 86  (left arm) Cuff size:   regular  Vitals Entered By: Levon Hedger (July 29, 2010 4:08 PM)  Nutrition Counseling: Patient's BMI is greater than 25 and therefore counseled on weight management options.  O2 Flow:  Room air  Serial Vital Signs/Assessments:  Comments: p/f  130,  130, 100 By: Levon Hedger   CC: coughing, bodyaches, head congestion, chest congestion since Sunday and has been taking Coricidan...has been coughing up yellow phlem tinged in blood. Is Patient Diabetic? No Pain Assessment Patient in pain? yes     Location: body Intensity: 7 Type: aching  Does patient need assistance? Functional Status Self care Ambulation Normal   CC:  coughing, bodyaches, head congestion, and chest congestion since Sunday and has been taking Coricidan...has been coughing up yellow phlem tinged in blood.Marland Kitchen  History of Present Illness:  Pt into the office with complaints of cough and shortness of breath Symptoms ongoing for the past 2 weeks - progressing. +nasal congestion +cough -fever +tobacco abuse (but decreased some)  Asthma History    Asthma Control Assessment:    Age range: 12+ years    Symptoms: throughout the day    Nighttime Awakenings: 4 or more/week    Interferes w/ normal activity: extreme limitations    SABA use (not for EIB): >2 days/week    Exacerbations requiring oral systemic steroids: 0-1/year    Asthma Control Assessment: Very Poorly Controlled    Habits & Providers  Alcohol-Tobacco-Diet     Alcohol drinks/day: 0     Tobacco Status: current     Tobacco Counseling: to quit use of tobacco products     Year Started:  2007  Exercise-Depression-Behavior     Does Patient Exercise: no     Exercise Counseling: to improve exercise regimen     STD Risk: past     STD Risk Counseling: to avoid increased STD risk     Contraception Counseling: not indicated; no questions/concerns expressed     Drug Use: never     Seat Belt Use: always     Sun Exposure: frequent  Allergies (verified): No Known Drug Allergies  Review of Systems General:  Complains of fever. CV:  Complains of shortness of breath with exertion; denies chest pain or discomfort. Resp:  Complains of cough. GI:  Denies abdominal pain, nausea, and vomiting.  Physical Exam  General:  alert.   Head:  normocephalic.   Lungs:  scattered rhonchi with few wheezes Heart:  normal rate and regular rhythm.   Neurologic:  alert & oriented X3.     Impression & Recommendations:  Problem # 1:  ACUTE BRONCHITIS (ICD-466.0)  Her updated medication list for this problem includes:    Combivent 103-18 Mcg/act Aero (Ipratropium-albuterol) .Marland Kitchen... 2 puffs every 6 hours for shortness of breath    Symbicort 160-4.5 Mcg/act Aero (Budesonide-formoterol fumarate) ..... One inhalation two times a day    Albuterol Sulfate (2.5 Mg/55ml) 0.083% Nebu (Albuterol sulfate) ..... Nebulize every 4 hours as needed--pt. has medication from a previous provider  Amoxicillin 500 Mg Caps (Amoxicillin) ..... One capsule by mouth three times a day for infection  Complete Medication List: 1)  Combivent 103-18 Mcg/act Aero (Ipratropium-albuterol) .... 2 puffs every 6 hours for shortness of breath 2)  Metoprolol Succinate 50 Mg Xr24h-tab (Metoprolol succinate) .Marland Kitchen.. 1 tablet by mouth daily for blood pressure 3)  Hydrochlorothiazide 25 Mg Tabs (Hydrochlorothiazide) .... Take one (1) by mouth daily for blood pressure 4)  Lexapro 10 Mg Tabs (Escitalopram oxalate) .... Two tablets by mouth daily 5)  Symbicort 160-4.5 Mcg/act Aero (Budesonide-formoterol fumarate) .... One inhalation two  times a day 6)  Nortriptyline Hcl 25 Mg Caps (Nortriptyline hcl) .... One capsule by mouth nightly as needed for cramps 7)  Loratadine 10 Mg Tabs (Loratadine) .... One tablet by mouth daily 8)  Fluticasone Propionate 50 Mcg/act Susp (Fluticasone propionate) .... One spray in each nostril two times a day (hold head down) 9)  Albuterol Sulfate (2.5 Mg/41ml) 0.083% Nebu (Albuterol sulfate) .... Nebulize every 4 hours as needed--pt. has medication from a previous provider 10)  Aleve 220 Mg Tabs (Naproxen sodium) .... 2 tablets by mouth daily for 3 days then as needed 11)  Naprosyn 500 Mg Tabs (Naproxen) .... One tablet by mouth two times a day as needed for pain 12)  Diazepam 5 Mg Tabs (Diazepam) .... One tablet by mouth nightly for nerves 13)  Prednisone 10 Mg Tabs (Prednisone) .... 30mg  x 3 days, 20mg  x 3 days, 10mg  x 3 days for breathing 14)  Amoxicillin 500 Mg Caps (Amoxicillin) .... One capsule by mouth three times a day for infection  Other Orders: Pulse Oximetry (single measurment) (94760) Peak Flow Rate (94150)  Asthma Management Plan    Asthma Severity: Moderate Persistent    Control Assessment: Very Poorly Controlled    Personal best PEF: 130 liters/minute    Predicted PEF: 455 liters/minute    Working PEF: 455 liters/minute    Plan based on PEF formula: [place] [employer] Zone: (Range: 360 to 460)   SYMBICORT 160-4.5 MCG/ACT AERO:  1 inhalation twice a day  Yellow Zone: SYMBICORT 160-4.5 MCG/ACT AERO:  2 puffs every 4 hours as needed  Red Zone: SYMBICORT 160-4.5 MCG/ACT AERO Start Prednisone Taper.    Patient Instructions: 1)  Prednisone - take for the next 9 days 2)  Saturday  - 3 tabs 3)  Sunday - 3 tabs 4)  Monday - 3 tabs 5)  Tuesday - 2 tabs 6)  Wednesday - 2 tabs 7)  Thursday - 2 tabs 8)  Friday - 1 tab 9)  Saturday - 1 tab 10)  Sunday - 1 tab 11)  Also take amoxil 500mg by mouth three times a day with food for the next 10 days 12)  Follow up as  needed Prescriptions: PREDNISONE 10 MG TABS (PREDNISONE) 30mg x 3 days, 20mg x 3 days, 10mg x 3 days for breathing  #18 x 0   Entered and Authorized by:   Nykedtra Martin FNP   Signed by:   Nykedtra Martin FNP on 07/29/2010   Method used:   Electronically to        [facility] [address] (retail)       [address]       [place], [place]  [address]       Ph: [phone]       Fax: [phone]   RxID:   [id number] AMOXICILLIN 500 MG CAPS (AMOXICILLIN) One  capsule by mouth three times a day for infection  #30 x 0   Entered and Authorized by:   Lehman Prom FNP   Signed by:   Lehman Prom FNP on 07/29/2010   Method used:   Electronically to        [facility] [address] (retail)       [phone]       [place], [place]  [address]       Ph: [phone]       Fax: [phone]   RxID:   [id number] AMOXICILLIN 500 MG CAPS (AMOXICILLIN) One capsule by mouth three times a day for infection  #30 x 0   Entered and Authorized by:   Lehman Prom FNP   Signed by:   Lehman Prom FNP on 07/29/2010   Method used:   Print then Give to Patient   RxID:   [id number] PREDNISONE 10 MG TABS (PREDNISONE) 30mg  x 3 days, 20mg  x 3 days, 10mg  x 3 days for breathing  #18 x 0   Entered and Authorized by:   Lehman Prom FNP   Signed by:   Lehman Prom FNP on 07/29/2010   Method used:   Print then Give to Patient   RxID:   [id number]    Orders Added: 1)  Est. Patient Level III [32951] 2)  Pulse Oximetry (single measurment) [94760] 3)  Peak Flow Rate [94150]  Appended Document: Bronchitis     Allergies: No Known Drug Allergies   Complete Medication List: 1)  Combivent 103-18 Mcg/act Aero (Ipratropium-albuterol) .... 2 puffs every 6 hours for shortness of breath 2)  Metoprolol Succinate 50 Mg Xr24h-tab (Metoprolol succinate) .Marland Kitchen.. 1 tablet by mouth daily for blood pressure 3)   Hydrochlorothiazide 25 Mg Tabs (Hydrochlorothiazide) .... Take one (1) by mouth daily for blood pressure 4)  Lexapro 10 Mg Tabs (Escitalopram oxalate) .... Two tablets by mouth daily 5)  Symbicort 160-4.5 Mcg/act Aero (Budesonide-formoterol fumarate) .... One inhalation two times a day 6)  Nortriptyline Hcl 25 Mg Caps (Nortriptyline hcl) .... One capsule by mouth nightly as needed for cramps 7)  Loratadine 10 Mg Tabs (Loratadine) .... One tablet by mouth daily 8)  Fluticasone Propionate 50 Mcg/act Susp (Fluticasone propionate) .... One spray in each nostril two times a day (hold head down) 9)  Albuterol Sulfate (2.5 Mg/58ml) 0.083% Nebu (Albuterol sulfate) .... Nebulize every 4 hours as needed--pt. has medication from a previous provider 10)  Aleve 220 Mg Tabs (Naproxen sodium) .... 2 tablets by mouth daily for 3 days then as needed 11)  Naprosyn 500 Mg Tabs (Naproxen) .... One tablet by mouth two times a day as needed for pain 12)  Diazepam 5 Mg Tabs (Diazepam) .... One tablet by mouth nightly for nerves 13)  Prednisone 10 Mg Tabs (Prednisone) .... 30mg  x 3 days, 20mg  x 3 days, 10mg  x 3 days for breathing 14)  Amoxicillin 500 Mg Caps (Amoxicillin) .... One capsule by mouth three times a day for infection  Other Orders: Nebulizer Tx (88416) Atrovent 1mg  (Neb) [phone]) Albuterol Sulfate Sol 1mg  unit dose (Z6010) Depo- Medrol 80mg  (J1040) Admin of Therapeutic Inj  intramuscular or subcutaneous (93235)   Medication Administration  Injection # 1:    Medication: Depo- Medrol 80mg     Diagnosis: ACUTE BRONCHITIS (ICD-466.0)    Route: IM    Site: RUOQ gluteus    Exp Date: 6/14    Lot #: [id number]  Mfr: Pharm[employer]    Patient tolerated injection without complications    Given by: Levon Hedger (August 01, 2010 4:58 PM)  Medication # 1:    Medication: Albuterol Sulfate Sol 1mg  unit dose    Diagnosis: ACUTE BRONCHITIS (ICD-466.0)    Dose: 2.3ml    Route: po    Exp Date: 08/12    Lot #:  [id number]    Mfr: nephron    Patient tolerated medication without complications  Medication # 2:    Medication: Atrovent 1mg  (Neb)    Diagnosis: ACUTE BRONCHITIS (ICD-466.0)    Dose: 3mg     Route: po    Exp Date: 09/12    Lot #: [id number]    Mfr: nephron    Patient tolerated medication without complications  Orders Added: 1)  Nebulizer Tx [94640] 2)  Atrovent 1mg  (Neb) [Z0258] 3)  Albuterol Sulfate Sol 1mg  unit dose [N2778] 4)  Depo- Medrol 80mg  [J1040] 5)  Admin of Therapeutic Inj  intramuscular or subcutaneous [96372]       Medication Administration  Injection # 1:    Medication: Depo- Medrol 80mg     Diagnosis: ACUTE BRONCHITIS (ICD-466.0)    Route: IM    Site: RUOQ gluteus    Exp Date: 6/14    Lot #: [id number]    Mfr: Pharmacia    Patient tolerated injection without complications    Given by: Levon Hedger (August 01, 2010 4:58 PM)  Medication # 1:    Medication: Albuterol Sulfate Sol 1mg  unit dose    Diagnosis: ACUTE BRONCHITIS (ICD-466.0)    Dose: 2.58ml    Route: po    Exp Date: 08/12    Lot #: [id number]    Mfr: nephron    Patient tolerated medication without complications  Medication # 2:    Medication: Atrovent 1mg  (Neb)    Diagnosis: ACUTE BRONCHITIS (ICD-466.0)    Dose: 3mg     Route: po    Exp Date: 09/12    Lot #: [id number]    Mfr: nephron    Patient tolerated medication without complications  Orders Added: 1)  Nebulizer Tx [94640] 2)  Atrovent 1mg  (Neb) [X5400] 3)  Albuterol Sulfate Sol 1mg  unit dose [Q6761] 4)  Depo- Medrol 80mg  [J1040] 5)  Admin of Therapeutic Inj  intramuscular or subcutaneous [95093]

## 2010-10-11 NOTE — Progress Notes (Signed)
Summary: Neurology Referral  Phone Note Call from Patient Call back at [phone]   Caller: Patient Reason for Call: Referral Summary of Call: PT NEED A REFERRAL TO THE NEUROLOGY PT   SAID THAT HER DAUGHTER DROP THE PAPERS YESTERDAY ABOUT HER REFERRAL  PLEASE, CALL HER [phone] THANK YOU  Initial call taken by: Cheryll Dessert,  June 24, 2010 8:42 AM  Follow-up for Phone Call        I reviewed pt's office visit from [facility] on 06/25/2010- no mention that she needs neurology referral. 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  These are time limited and will reduce with decrease in stressors. Thought she had an appt scheduled with me on 06/30/2010 - is she still scheduled? Follow-up by: Lehman Prom FNP,  June 28, 2010 9:34 AM  Additional Follow-up for Phone Call Additional follow up Details #1::        Left message on voicemail for pt. to return call.  Dutch Quint RN  June 28, 2010 4:01 PM  Confirmed appt. for 06/30/10 -- states she knows she doesn't need a neurology referral, just a visit to her [name] before she goes back to work. Additional Follow-up by: Dutch Quint RN,  June 28, 2010 4:47 PM

## 2010-10-11 NOTE — Assessment & Plan Note (Signed)
Summary: 3629 PT/HAVING TROUBLE BREATHING///KT   Vital Signs:  Patient profile:   55 year old female Menstrual status:  hysterectomy Weight:      202.1 pounds O2 Sat:      95 % on Room air Temp:     97.3 degrees F oral Pulse rate:   63 / minute Pulse rhythm:   regular Resp:     16 per minute BP sitting:   141 / 83  (left arm) Cuff size:   regular  Vitals Entered By: Levon Hedger (November 12, 2009 2:07 PM)  O2 Flow:  Room air  Serial Vital Signs/Assessments:  Comments: 2:46 PM PF 130, 130, 160 By: Mikey College CMA  3:27 PM Post PF 140, 170, 150 By: Mikey College CMA   CC: pt has been [place] and was diagnosed with pnuemonia and still is having difficulties breathing...still has chest soreness from cough...when lying down it is hard to get her breath Is Patient Diabetic? No Pain Assessment Patient in pain? yes       Does patient need assistance? Functional Status Self care Ambulation Normal Comments pt could not afford codeine-guifenesin and did not get the Tamiflu    CC:  pt has been [place] and was diagnosed with pnuemonia and still is having difficulties breathing...still has chest soreness from cough...when lying down it is hard to get her breath.  History of Present Illness: 55 yo female pt. of Jesse Fall, FNP here with hx of being seen in ED  2/28 with aching, runny nose, fevers, chills, coughing--nonproductive.  Pt. was noted to be wheezing.  Placed on Tamiflu,  Prednisone dose pack, Codeine/guaifenesin mix for cough and ibuprofen. Pt. states the [facility] pharmacy did not have the Tamiflu and never got back to her to get her started.  Did not get the cough syrup as almost 100 dollars.   No CXR.  Continues on her usual Advair and Combivent as well.  No longer having fevers and chills, or aches.  Having anterior chest discomfort--worse when lies down and coughs--cannot catch breath.  Bringing up light yellow mucous now.  Using Combivent only two times a day.  Using  Albuterol nebulizer once daily--only uses when really needs.  Allergies (verified): No Known Drug Allergies  Physical Exam  General:  Congested cough Ears:  External ear exam shows no significant lesions or deformities.  Otoscopic examination reveals clear canals, tympanic membranes are intact bilaterally without bulging, retraction, inflammation or discharge. Hearing is grossly normal bilaterally. Nose:  mucosal erythema and mucosal edema.  Clear discharge Mouth:  pharynx pink and moist.   Neck:  No deformities, masses, or tenderness noted. Chest Wall:  Tender over anterior chest. Lungs:  Decreased BS, Exp wheeze, no crackles Heart:  Normal rate and regular rhythm. S1 and S2 normal without gallop, murmur, click, rub or other extra sounds.   Impression & Recommendations:  Problem # 1:  ACUTE BRONCHOSPASM (ICD-519.11) Better air movement and less wheezing after nebs x2. Increase prednisone for repeat burst and prolonged taper. To use nebulizer every 4 hours for now--may taper as symptoms improve  Orders: Nebulizer Tx (04540)  Problem # 2:  INFLUENZA, WITH RESPIRATORY SYMPTOMS (ICD-487.1) Too late for Tamiflu Supportive care Concern for secondary bacterial pneumonia or atypicals as well--Doxycycline  Complete Medication List: 1)  Combivent 103-18 Mcg/act Aero (Ipratropium-albuterol) .... 2 puffs every 6 hours for shortness of breath 2)  Metoprolol Succinate 50 Mg Xr24h-tab (Metoprolol succinate) .Marland Kitchen.. 1 tablet by mouth daily for blood  pressure 3)  Hydrochlorothiazide 25 Mg Tabs (Hydrochlorothiazide) .... Take one (1) by mouth daily for blood pressure 4)  Lexapro 10 Mg Tabs (Escitalopram oxalate) .... One tablet by mouth daily 5)  Advair Diskus 100-50 Mcg/dose Aepb (Fluticasone-salmeterol) .... One inhalation two times a day 6)  Tessalon Perles 100 Mg Caps (Benzonatate) .... One capsule by mouth two times a day as needed for cough 7)  Nortriptyline Hcl 10 Mg Caps (Nortriptyline  hcl) .... One capsule by mouth nightly as needed for cramps 8)  Loratadine 10 Mg Tabs (Loratadine) .... One tablet by mouth daily 9)  Fluticasone Propionate 50 Mcg/act Susp (Fluticasone propionate) .... One spray in each nostril two times a day (hold head down) 10)  Albuterol Sulfate (2.5 Mg/6ml) 0.083% Nebu (Albuterol sulfate) .... Nebulize every 4 hours as needed--pt. has medication from a previous provider 11)  Doxycycline Hyclate 100 Mg Caps (Doxycycline hyclate) .Marland Kitchen.. 1 cap by mouth two times a day for 10 days 12)  Prednisone 10 Mg Tabs (Prednisone) .... 4 tabs by mouth daily for 4 days, then decrease by 1/2 tab daily until off.  Other Orders: Albuterol Sulfate Sol 1mg  unit dose (I3382)  Patient Instructions: 1)  Prednisone 10 mg tabs: 2)  Days 1-4:  4 tabs 3)  Day 5: 3 1/2 tabs 4)  Day 6: 3 tabs 5)  Day 7:  2 1/2 tabs 6)  Day 8:  2 tabs 7)  Day 9: 1 1/2 tabs 8)  Day 10: 1 tab 9)  Day 11:  1/2 tab 10)  Day 12:  Off 11)  Follow up with Dr. Delrae Alfred in 1 week--bronchospasm, bronchitis Prescriptions: PREDNISONE 10 MG TABS (PREDNISONE) 4 tabs by mouth daily for 4 days, then decrease by 1/2 tab daily until off.  #30 x 0   Entered and Authorized by:   Julieanne Manson MD   Signed by:   Julieanne Manson MD on 11/12/2009   Method used:   Electronically to        [facility] [address] (retail)       [phone]       [place], [place]  [address]       Ph: [phone]       Fax: [phone]   RxID:   [id number] DOXYCYCLINE HYCLATE 100 MG CAPS (DOXYCYCLINE HYCLATE) 1 cap by mouth two times a day for 10 days  #20 x 0   Entered and Authorized by:   Julieanne Manson MD   Signed by:   Julieanne Manson MD on 11/12/2009   Method used:   Electronically to        [facility] [address] (retail)       [phone]       [place], [place]  [address]       Ph: [phone]       Fax: [phone]   RxID:    [id number]    Medication Administration  Medication # 1:    Medication: Albuterol Sulfate Sol 1mg  unit dose    Diagnosis: ACUTE BRONCHITIS (ICD-466.0)    Dose: 0.083%    Route: inhaled    Exp Date: 05/12/2011    Lot #: [id number]    Mfr: nephron    Patient tolerated medication without complications    Given by: Mikey College CMA (November 12, 2009 3:27 PM)  Orders Added: 1)  Nebulizer Tx [phone] 2)  Albuterol Sulfate Sol 1mg  unit dose [J7613] 3)  Est. Patient Level III [36644]      Vital Signs:  Patient profile:   55 year old female Menstrual status:  hysterectomy Weight:      202.1 pounds O2 Sat:      95 % Temp:     97.3 degrees F oral Pulse rate:   63 / minute Pulse rhythm:   regular Resp:     16 per minute BP sitting:   141 / 83  (left arm) Cuff size:   regular  Vitals Entered By: Levon Hedger (November 12, 2009 2:07 PM)  O2 Flow:  Room air

## 2010-10-11 NOTE — Letter (Signed)
Summary: Handout Printed  Printed Handout:  - Torticollis, Adult 

## 2010-10-11 NOTE — Assessment & Plan Note (Signed)
Summary: 3629 PT/FU PER Scarleth Brame////KT   Vital Signs:  Patient profile:   55 year old female Menstrual status:  hysterectomy Height:      64 inches Weight:      203 pounds BMI:     34.97 Temp:     98.0 degrees F oral Pulse rate:   62 / minute Pulse rhythm:   regular Resp:     18 per minute BP sitting:   142 / 89  (left arm) Cuff size:   regular  Vitals Entered By: [place] Shannon (November 19, 2009 8:41 AM) CC: f/u.... pt is alot better.. Is Patient Diabetic? No Pain Assessment Patient in pain? no       Does patient need assistance? Functional Status Self care Ambulation Normal   CC:  f/u.... pt is alot better...  History of Present Illness: 1.  Viral syndrome--probably influenza with bronchospasm and concern for a secondary bacterial component--bronchitis.  Feeling much better.  Really noted improvement about 3 days ago.  No fevers.  Cough resolved.  No wheezing.  Upper airway congestion almost gone as well.  Down to 20 mg of Prednisone with taper.  Doing okay on Doxycycline--2 more days of that left.  Has not needed nebs for past 24 hours.  Current Medications (verified): 1)  Combivent 103-18 Mcg/act Aero (Ipratropium-Albuterol) .... 2 Puffs Every 6 Hours For Shortness of Breath 2)  Metoprolol Succinate 50 Mg Xr24h-Tab (Metoprolol Succinate) .Marland Kitchen.. 1 Tablet By Mouth Daily For Blood Pressure 3)  Hydrochlorothiazide 25 Mg Tabs (Hydrochlorothiazide) .... Take One (1) By Mouth Daily For Blood Pressure 4)  Lexapro 10 Mg Tabs (Escitalopram Oxalate) .... One Tablet By Mouth Daily 5)  Advair Diskus 100-50 Mcg/dose Aepb (Fluticasone-Salmeterol) .... One Inhalation Two Times A Day 6)  Tessalon Perles 100 Mg Caps (Benzonatate) .... One Capsule By Mouth Two Times A Day As Needed For Cough 7)  Nortriptyline Hcl 10 Mg Caps (Nortriptyline Hcl) .... One Capsule By Mouth Nightly As Needed For Cramps 8)  Loratadine 10 Mg Tabs (Loratadine) .... One Tablet By Mouth Daily 9)  Fluticasone  Propionate 50 Mcg/act Susp (Fluticasone Propionate) .... One Spray in Each Nostril Two Times A Day (Hold Head Down) 10)  Albuterol Sulfate (2.5 Mg/60ml) 0.083% Nebu (Albuterol Sulfate) .... Nebulize Every 4 Hours As Needed--Pt. Has Medication From A Previous Provider 11)  Doxycycline Hyclate 100 Mg Caps (Doxycycline Hyclate) .Marland Kitchen.. 1 Cap By Mouth Two Times A Day For 10 Days 12)  Prednisone 10 Mg Tabs (Prednisone) .... 4 Tabs By Mouth Daily For 4 Days, Then Decrease By 1/2 Tab Daily Until Off.  Allergies (verified): No Known Drug Allergies  Physical Exam  General:  NAD, rare cough Ears:  External ear exam shows no significant lesions or deformities.  Otoscopic examination reveals clear canals, tympanic membranes are intact bilaterally without bulging, retraction, inflammation or discharge. Hearing is grossly normal bilaterally. Mouth:  pharynx pink and moist.   Neck:  No deformities, masses, or tenderness noted. Lungs:  Normal respiratory effort, chest expands symmetrically. Lungs are clear to auscultation, no crackles or wheezes. Heart:  Normal rate and regular rhythm. S1 and S2 normal without gallop, murmur, click, rub or other extra sounds.   Impression & Recommendations:  Problem # 1:  ACUTE BRONCHITIS (ICD-466.0) Resolving nicely Probable initiating viral illness/flu Her updated medication list for this problem includes:    Combivent 103-18 Mcg/act Aero (Ipratropium-albuterol) .Marland Kitchen... 2 puffs every 6 hours for shortness of breath    Advair Diskus 100-50 Mcg/dose Aepb (  Fluticasone-salmeterol) ..... One inhalation two times a day    Tessalon Perles 100 Mg Caps (Benzonatate) ..... One capsule by mouth two times a day as needed for cough    Albuterol Sulfate (2.5 Mg/77ml) 0.083% Nebu (Albuterol sulfate) ..... Nebulize every 4 hours as needed--pt. has medication from a previous provider    Doxycycline Hyclate 100 Mg Caps (Doxycycline hyclate) .Marland Kitchen... 1 cap by mouth two times a day for 10  days  Problem # 2:  ACUTE BRONCHOSPASM (ICD-519.11) Resolved  Complete Medication List: 1)  Combivent 103-18 Mcg/act Aero (Ipratropium-albuterol) .... 2 puffs every 6 hours for shortness of breath 2)  Metoprolol Succinate 50 Mg Xr24h-tab (Metoprolol succinate) .Marland Kitchen.. 1 tablet by mouth daily for blood pressure 3)  Hydrochlorothiazide 25 Mg Tabs (Hydrochlorothiazide) .... Take one (1) by mouth daily for blood pressure 4)  Lexapro 10 Mg Tabs (Escitalopram oxalate) .... One tablet by mouth daily 5)  Advair Diskus 100-50 Mcg/dose Aepb (Fluticasone-salmeterol) .... One inhalation two times a day 6)  Tessalon Perles 100 Mg Caps (Benzonatate) .... One capsule by mouth two times a day as needed for cough 7)  Nortriptyline Hcl 10 Mg Caps (Nortriptyline hcl) .... One capsule by mouth nightly as needed for cramps 8)  Loratadine 10 Mg Tabs (Loratadine) .... One tablet by mouth daily 9)  Fluticasone Propionate 50 Mcg/act Susp (Fluticasone propionate) .... One spray in each nostril two times a day (hold head down) 10)  Albuterol Sulfate (2.5 Mg/22ml) 0.083% Nebu (Albuterol sulfate) .... Nebulize every 4 hours as needed--pt. has medication from a previous provider 11)  Doxycycline Hyclate 100 Mg Caps (Doxycycline hyclate) .Marland Kitchen.. 1 cap by mouth two times a day for 10 days 12)  Prednisone 10 Mg Tabs (Prednisone) .... 4 tabs by mouth daily for 4 days, then decrease by 1/2 tab daily until off.

## 2010-11-21 ENCOUNTER — Telehealth (INDEPENDENT_AMBULATORY_CARE_PROVIDER_SITE_OTHER): Payer: Self-pay | Admitting: Nurse Practitioner

## 2010-11-24 LAB — URINALYSIS, ROUTINE W REFLEX MICROSCOPIC
Bilirubin Urine: NEGATIVE
Bilirubin Urine: NEGATIVE
Glucose, UA: NEGATIVE mg/dL
Hgb urine dipstick: NEGATIVE
Ketones, ur: 15 mg/dL — AB
Nitrite: NEGATIVE
Protein, ur: NEGATIVE mg/dL
Specific Gravity, Urine: 1.009 (ref 1.005–1.030)
Urobilinogen, UA: 1 mg/dL (ref 0.0–1.0)
Urobilinogen, UA: 1 mg/dL (ref 0.0–1.0)
pH: 6.5 (ref 5.0–8.0)

## 2010-11-24 LAB — COMPREHENSIVE METABOLIC PANEL
ALT: 61 U/L — ABNORMAL HIGH (ref 0–35)
AST: 35 U/L (ref 0–37)
AST: 40 U/L — ABNORMAL HIGH (ref 0–37)
Albumin: 3 g/dL — ABNORMAL LOW (ref 3.5–5.2)
Alkaline Phosphatase: 67 U/L (ref 39–117)
BUN: 12 mg/dL (ref 6–23)
CO2: 25 mEq/L (ref 19–32)
CO2: 26 mEq/L (ref 19–32)
Calcium: 10.7 mg/dL — ABNORMAL HIGH (ref 8.4–10.5)
Chloride: 105 mEq/L (ref 96–112)
Chloride: 107 mEq/L (ref 96–112)
GFR calc Af Amer: >60 mL/min (ref >60)
GFR calc non Af Amer: >60 mL/min (ref >60)
GFR calc non Af Amer: >60 mL/min (ref >60)
Potassium: 3.6 mEq/L (ref 3.5–5.1)
Potassium: 3.8 mEq/L (ref 3.5–5.1)
Sodium: 139 mEq/L (ref 135–145)
Total Bilirubin: 0.5 mg/dL (ref 0.3–1.2)

## 2010-11-24 LAB — CBC
Hemoglobin: 12.6 g/dL (ref 12.0–15.0)
Hemoglobin: 14 g/dL (ref 12.0–15.0)
MCH: 27.8 pg (ref 26.0–34.0)
MCHC: 34.1 g/dL (ref 30.0–36.0)
MCV: 82.2 fL (ref 78.0–100.0)
Platelets: 220 10*3/uL (ref 150–400)
RBC: 4.54 MIL/uL (ref 3.87–5.11)
RBC: 5.07 MIL/uL (ref 3.87–5.11)
WBC: 5.2 10*3/uL (ref 4.0–10.5)
WBC: 8.6 10*3/uL (ref 4.0–10.5)

## 2010-11-24 LAB — DIFFERENTIAL
Basophils Absolute: 0 10*3/uL (ref 0.0–0.1)
Basophils Relative: 0 % (ref 0–1)
Eosinophils Absolute: 0 10*3/uL (ref 0.0–0.7)
Eosinophils Relative: 0 % (ref 0–5)
Eosinophils Relative: 3 % (ref 0–5)
Lymphs Abs: 2.6 10*3/uL (ref 0.7–4.0)
Monocytes Absolute: 0.4 10*3/uL (ref 0.1–1.0)

## 2010-11-24 LAB — ETHANOL: Alcohol, Ethyl (B): <5 mg/dL (ref 0–10)

## 2010-11-24 LAB — RAPID URINE DRUG SCREEN, HOSP PERFORMED: Tetrahydrocannabinol: NOT DETECTED

## 2010-11-24 LAB — LIPASE, BLOOD: Lipase: 25 U/L (ref 11–59)

## 2010-11-29 NOTE — Progress Notes (Signed)
Summary: Query:  Refill nortriptyline?  Phone Note Outgoing Call   Summary of Call: Last seen 07/2010.  Do you want to refill nortriptyline?  Last written 03/2010 x30 days only.  Not sure if this is part of my protocol. Initial call taken by: Dutch Quint RN,  November 21, 2010 9:38 AM  Follow-up for Phone Call        yes, ok to refill Follow-up by: Lehman Prom FNP,  November 21, 2010 10:00 AM  Additional Follow-up for Phone Call Additional follow up Details #1::        Noted.  Refilled x30 days.  Dutch Quint RN  November 21, 2010 10:06 AM

## 2010-12-18 ENCOUNTER — Inpatient Hospital Stay (INDEPENDENT_AMBULATORY_CARE_PROVIDER_SITE_OTHER)
Admission: RE | Admit: 2010-12-18 | Discharge: 2010-12-18 | Disposition: A | Discharge status: Home / Self Care | Payer: Self-pay | Source: Ambulatory Visit | Attending: Emergency Medicine | Admitting: Emergency Medicine

## 2010-12-18 ENCOUNTER — Ambulatory Visit (INDEPENDENT_AMBULATORY_CARE_PROVIDER_SITE_OTHER): Payer: Self-pay

## 2010-12-18 DIAGNOSIS — J309 Allergic rhinitis, unspecified: Secondary | ICD-10-CM

## 2010-12-18 DIAGNOSIS — J45909 Unspecified asthma, uncomplicated: Secondary | ICD-10-CM

## 2011-03-14 ENCOUNTER — Other Ambulatory Visit (HOSPITAL_COMMUNITY): Payer: Self-pay | Admitting: Family Medicine

## 2011-03-14 DIAGNOSIS — Z1231 Encounter for screening mammogram for malignant neoplasm of breast: Secondary | ICD-10-CM

## 2011-04-10 ENCOUNTER — Ambulatory Visit (HOSPITAL_COMMUNITY): Payer: Self-pay

## 2011-04-18 ENCOUNTER — Ambulatory Visit (HOSPITAL_COMMUNITY)
Admission: RE | Admit: 2011-04-18 | Discharge: 2011-04-18 | Disposition: A | Discharge status: Home / Self Care | Payer: Self-pay | Source: Ambulatory Visit | Attending: Family Medicine | Admitting: Family Medicine

## 2011-04-18 DIAGNOSIS — Z1231 Encounter for screening mammogram for malignant neoplasm of breast: Secondary | ICD-10-CM

## 2011-05-03 ENCOUNTER — Ambulatory Visit (HOSPITAL_COMMUNITY)
Admission: RE | Admit: 2011-05-03 | Discharge: 2011-05-03 | Disposition: A | Discharge status: Home / Self Care | Payer: Self-pay | Source: Ambulatory Visit | Attending: Internal Medicine | Admitting: Internal Medicine

## 2011-05-03 ENCOUNTER — Other Ambulatory Visit: Payer: Self-pay | Admitting: Internal Medicine

## 2011-05-03 DIAGNOSIS — M549 Dorsalgia, unspecified: Secondary | ICD-10-CM | POA: Insufficient documentation

## 2011-05-03 DIAGNOSIS — J40 Bronchitis, not specified as acute or chronic: Secondary | ICD-10-CM | POA: Insufficient documentation

## 2011-05-03 DIAGNOSIS — F17200 Nicotine dependence, unspecified, uncomplicated: Secondary | ICD-10-CM | POA: Insufficient documentation

## 2011-06-12 LAB — TROPONIN I: Troponin I: 0.02

## 2011-06-12 LAB — COMPREHENSIVE METABOLIC PANEL
BUN: 7
CO2: 27
Calcium: 9.4
Chloride: 103
Creatinine, Ser: 0.64
GFR calc non Af Amer: >60
Glucose, Bld: 87
Total Bilirubin: 0.5

## 2011-06-12 LAB — CBC
HCT: 46.2
MCHC: 33.1
MCV: 92.1
Platelets: 314
RDW: 13.3
WBC: 6.9

## 2011-06-12 LAB — POCT CARDIAC MARKERS
Myoglobin, poc: 38.4
Myoglobin, poc: 55
Troponin i, poc: <0.05

## 2011-06-12 LAB — DIFFERENTIAL
Basophils Absolute: 0
Eosinophils Absolute: 0.3
Eosinophils Relative: 4
Lymphocytes Relative: 44
Lymphs Abs: 3
Neutrophils Relative %: 47

## 2011-06-12 LAB — CK TOTAL AND CKMB (NOT AT ARMC): Total CK: 122

## 2011-06-16 ENCOUNTER — Emergency Department (HOSPITAL_COMMUNITY): Payer: Self-pay

## 2011-06-16 ENCOUNTER — Encounter (HOSPITAL_COMMUNITY): Payer: Self-pay | Admitting: Radiology

## 2011-06-16 ENCOUNTER — Emergency Department (HOSPITAL_COMMUNITY)
Admission: EM | Admit: 2011-06-16 | Discharge: 2011-06-16 | Disposition: A | Discharge status: Home / Self Care | Payer: Self-pay | Attending: Emergency Medicine | Admitting: Emergency Medicine

## 2011-06-16 DIAGNOSIS — R0682 Tachypnea, not elsewhere classified: Secondary | ICD-10-CM | POA: Insufficient documentation

## 2011-06-16 DIAGNOSIS — E049 Nontoxic goiter, unspecified: Secondary | ICD-10-CM | POA: Insufficient documentation

## 2011-06-16 DIAGNOSIS — R000 Tachycardia, unspecified: Secondary | ICD-10-CM | POA: Insufficient documentation

## 2011-06-16 DIAGNOSIS — J40 Bronchitis, not specified as acute or chronic: Secondary | ICD-10-CM | POA: Insufficient documentation

## 2011-06-16 DIAGNOSIS — F3289 Other specified depressive episodes: Secondary | ICD-10-CM | POA: Insufficient documentation

## 2011-06-16 DIAGNOSIS — I10 Essential (primary) hypertension: Secondary | ICD-10-CM | POA: Insufficient documentation

## 2011-06-16 DIAGNOSIS — R059 Cough, unspecified: Secondary | ICD-10-CM | POA: Insufficient documentation

## 2011-06-16 DIAGNOSIS — J45909 Unspecified asthma, uncomplicated: Secondary | ICD-10-CM | POA: Insufficient documentation

## 2011-06-16 DIAGNOSIS — F329 Major depressive disorder, single episode, unspecified: Secondary | ICD-10-CM | POA: Insufficient documentation

## 2011-06-16 DIAGNOSIS — R05 Cough: Secondary | ICD-10-CM | POA: Insufficient documentation

## 2011-06-16 HISTORY — DX: Essential (primary) hypertension: I10

## 2011-06-16 LAB — COMPREHENSIVE METABOLIC PANEL
AST: 20 U/L (ref 0–37)
Albumin: 3.8 g/dL (ref 3.5–5.2)
Alkaline Phosphatase: 82 U/L (ref 39–117)
BUN: 9 mg/dL (ref 6–23)
CO2: 25 mEq/L (ref 19–32)
Chloride: 104 mEq/L (ref 96–112)
GFR calc non Af Amer: >60 mL/min (ref >60)
Potassium: 3.9 mEq/L (ref 3.5–5.1)
Total Bilirubin: 0.9 mg/dL (ref 0.3–1.2)

## 2011-06-16 LAB — DIFFERENTIAL
Basophils Absolute: 0 10*3/uL (ref 0.0–0.1)
Basophils Relative: 0 % (ref 0–1)
Basophils Relative: 1 % (ref 0–1)
Eosinophils Relative: 4 % (ref 0–5)
Lymphs Abs: 3 10*3/uL (ref 0.7–4.0)
Monocytes Absolute: 0.8 10*3/uL (ref 0.1–1.0)
Monocytes Absolute: 0.5 10*3/uL (ref 0.1–1.0)
Monocytes Relative: 10 % (ref 3–12)
Neutro Abs: 4.2 10*3/uL (ref 1.7–7.7)
Neutro Abs: 3.5 10*3/uL (ref 1.7–7.7)
Neutrophils Relative %: 51 % (ref 43–77)

## 2011-06-16 LAB — CBC
HCT: 45.4 % (ref 39.0–52.0)
Hemoglobin: 14.6 g/dL (ref 12.0–15.0)
Hemoglobin: 15.1 g/dL (ref 13.0–17.0)
MCH: 27.1 pg (ref 26.0–34.0)
MCHC: 34.4 g/dL (ref 30.0–36.0)
MCV: 78.7 fL (ref 78.0–100.0)
MCV: 91 fL (ref 78.0–100.0)
Platelets: 303 10*3/uL (ref 150–400)
RBC: 5.39 MIL/uL — ABNORMAL HIGH (ref 3.87–5.11)
RBC: 4.99 MIL/uL (ref 4.22–5.81)
WBC: 7.9 10*3/uL (ref 4.0–10.5)

## 2011-06-16 LAB — URINALYSIS, ROUTINE W REFLEX MICROSCOPIC
Glucose, UA: NEGATIVE mg/dL
Hgb urine dipstick: NEGATIVE
Protein, ur: NEGATIVE mg/dL
Specific Gravity, Urine: 1.013 (ref 1.005–1.030)
pH: 6 (ref 5.0–8.0)

## 2011-06-16 LAB — POCT I-STAT, CHEM 8
BUN: 15 mg/dL (ref 6–23)
Calcium, Ion: 1.31 mmol/L (ref 1.12–1.32)
Creatinine, Ser: 0.6 mg/dL (ref 0.50–1.10)
Glucose, Bld: 88 mg/dL (ref 70–99)
Hemoglobin: 15 g/dL (ref 12.0–15.0)
Sodium: 139 mEq/L (ref 135–145)
TCO2: 23 mmol/L (ref 0–100)

## 2011-06-16 MED ORDER — IOHEXOL 300 MG/ML  SOLN: 100.0000 mL | Freq: Once | INTRAMUSCULAR | Status: DC | PRN

## 2011-08-30 ENCOUNTER — Encounter (HOSPITAL_COMMUNITY): Payer: Self-pay

## 2011-08-30 ENCOUNTER — Emergency Department (INDEPENDENT_AMBULATORY_CARE_PROVIDER_SITE_OTHER)
Admission: EM | Admit: 2011-08-30 | Discharge: 2011-08-30 | Disposition: A | Discharge status: Home / Self Care | Payer: Self-pay | Source: Home / Self Care | Attending: Emergency Medicine | Admitting: Emergency Medicine

## 2011-08-30 DIAGNOSIS — M79606 Pain in leg, unspecified: Secondary | ICD-10-CM

## 2011-08-30 DIAGNOSIS — M79609 Pain in unspecified limb: Secondary | ICD-10-CM

## 2011-08-30 LAB — POCT I-STAT, CHEM 8
Calcium, Ion: 1.25 mmol/L (ref 1.12–1.32)
HCT: 46 % (ref 36.0–46.0)
Sodium: 142 mEq/L (ref 135–145)
TCO2: 26 mmol/L (ref 0–100)

## 2011-08-30 MED ORDER — ACETAMINOPHEN-CODEINE #3 300-30 MG PO TABS: 1.0000 | ORAL_TABLET | Freq: Four times a day (QID) | ORAL | Status: AC | PRN

## 2011-08-30 NOTE — ED Notes (Signed)
C/o 2 week duration of pain in both legs , just at night when she tries to rest; pain comes and goes ; reports the whole leg is affected when she has the cramps

## 2011-08-30 NOTE — ED Provider Notes (Addendum)
History     CSN: [id number] Arrival date & time: 08/30/2011  3:32 PM   First MD Initiated Contact with Patient 08/30/11 1434      Chief Complaint  Patient presents with  . Leg Pain    (Consider location/radiation/quality/duration/timing/severity/associated sxs/prior treatment) HPI Comments: BOTH LEGS HAVE BEEN CRAMPING AT NIGHT" PATIENT COMES AND GOES, NO INJURIES OR FALLS, IT JUST HURTS LIKE CRAMPING PAINS, SOMETIMES ITS MOSTLY AT NIGHT, NO IS NOT RELATED TO WALKING  Patient is a 55 y.o. female presenting with leg pain. The history is provided by the patient. No language interpreter was used.  Leg Pain  Incident onset: ABOUT 2 WEEKS AGO. The incident occurred at home. There was no injury mechanism. The pain is present in the left leg and right leg. The pain is at a severity of 7/10. The pain is moderate. The pain has been intermittent since onset. Pertinent negatives include no numbness, no inability to bear weight, no loss of motion, no muscle weakness and no loss of sensation. She has tried nothing for the symptoms.    Past Medical History  Diagnosis Date  . Hypertension   . Asthma     History reviewed. No pertinent past surgical history.  No family history on file.  History  Substance Use Topics  . Smoking status: Not on file  . Smokeless tobacco: Not on file  . Alcohol Use: Not on file    OB History    Grav Para Term Preterm Abortions TAB SAB Ect Mult Living                  Review of Systems  Constitutional: Negative for fever and fatigue.  Musculoskeletal: Negative for back pain.  Skin: Negative for rash.  Neurological: Negative for weakness and numbness.    Allergies  Review of patient's allergies indicates no known allergies.  Home Medications   Current Outpatient Rx  Name Route Sig Dispense Refill  . ACETAMINOPHEN-CODEINE #3 300-30 MG PO TABS Oral Take 1-2 tablets by mouth every 6 (six) hours as needed for pain. 15 tablet 0    BP 146/75  Pulse  94  Temp(Src) 98.4 F (36.9 C) (Oral)  Resp 18  SpO2 99%  Physical Exam  Nursing note and vitals reviewed. Constitutional: She appears well-developed. No distress.  Musculoskeletal:       Legs: Neurological: She has normal strength. No sensory deficit. She exhibits normal muscle tone.  Skin: Skin is warm. No erythema.    ED Course  Procedures (including critical care time)  Labs Reviewed  POCT I-STAT, CHEM 8 - Abnormal; Notable for the following:    Glucose, Bld 111 (*)    Hemoglobin 15.6 (*)    All other components within normal limits  I-STAT, CHEM 8   No results found.   1. Musculoskeletal pain of lower extremity       MDM  2 weeks -with ongoing pains on both legs- cramping pains. No STS- NO EDEMA NO ERYTHEMA- NORMAL ELECTROLYTES- PATIENTT WAS RECENTLY DIAGNOSED WITH  A THYROID DISORDER STILL  ON NO MEDICINES. WILL CALL TOMORROW- NO INFECTIOUS- TRAUMATIC IN NATURE NEEDS FOLLOW-UP WITH PCP.        Jimmie Molly, MD 08/30/11 1813  Jimmie Molly, MD 08/30/11 [phone]

## 2011-09-01 ENCOUNTER — Encounter (HOSPITAL_COMMUNITY): Payer: Self-pay | Admitting: *Deleted

## 2011-09-01 ENCOUNTER — Emergency Department (INDEPENDENT_AMBULATORY_CARE_PROVIDER_SITE_OTHER): Payer: Self-pay

## 2011-09-01 ENCOUNTER — Emergency Department (INDEPENDENT_AMBULATORY_CARE_PROVIDER_SITE_OTHER)
Admission: EM | Admit: 2011-09-01 | Discharge: 2011-09-01 | Disposition: A | Discharge status: Home / Self Care | Payer: Self-pay | Source: Home / Self Care

## 2011-09-01 DIAGNOSIS — J45901 Unspecified asthma with (acute) exacerbation: Secondary | ICD-10-CM

## 2011-09-01 DIAGNOSIS — J111 Influenza due to unidentified influenza virus with other respiratory manifestations: Secondary | ICD-10-CM

## 2011-09-01 DIAGNOSIS — R6889 Other general symptoms and signs: Secondary | ICD-10-CM

## 2011-09-01 MED ORDER — ACETAMINOPHEN 325 MG PO TABS
650.0000 mg | ORAL_TABLET | Freq: Once | ORAL | Status: AC
  Administered 2011-09-01: 650 mg via ORAL

## 2011-09-01 MED ORDER — ALBUTEROL SULFATE (5 MG/ML) 0.5% IN NEBU
2.5000 mg | INHALATION_SOLUTION | Freq: Once | RESPIRATORY_TRACT | Status: AC
  Administered 2011-09-01: 2.5 mg via RESPIRATORY_TRACT

## 2011-09-01 MED ORDER — ALBUTEROL SULFATE (5 MG/ML) 0.5% IN NEBU
5.0000 mg | INHALATION_SOLUTION | Freq: Once | RESPIRATORY_TRACT | Status: AC
  Administered 2011-09-01: 5 mg via RESPIRATORY_TRACT

## 2011-09-01 MED ORDER — IPRATROPIUM BROMIDE 0.02 % IN SOLN
0.5000 mg | Freq: Once | RESPIRATORY_TRACT | Status: AC
  Administered 2011-09-01: 0.5 mg via RESPIRATORY_TRACT

## 2011-09-01 MED ORDER — ALBUTEROL SULFATE (5 MG/ML) 0.5% IN NEBU
INHALATION_SOLUTION | RESPIRATORY_TRACT | Status: AC
  Filled 2011-09-01: qty 0.5

## 2011-09-01 MED ORDER — ACETAMINOPHEN 325 MG PO TABS
ORAL_TABLET | ORAL | Status: AC
  Filled 2011-09-01: qty 2

## 2011-09-01 MED ORDER — PREDNISONE 20 MG PO TABS: 20.0000 mg | ORAL_TABLET | Freq: Two times a day (BID) | ORAL | Status: DC

## 2011-09-01 MED ORDER — ALBUTEROL SULFATE (5 MG/ML) 0.5% IN NEBU
INHALATION_SOLUTION | RESPIRATORY_TRACT | Status: AC
  Filled 2011-09-01: qty 1

## 2011-09-01 NOTE — ED Notes (Signed)
Pt  Has  Symptoms  Of  Cough  Fever      Shortness  Of  Breath  And  Pain in  Her  Chest  From  Coughing  Symptoms    yest  -  Seen  ucc 2  Days  Ago  For   Leg  Pain

## 2011-09-01 NOTE — ED Provider Notes (Signed)
History     CSN: [id number]  Arrival date & time 09/01/11  1301   None     Chief Complaint  Patient presents with  . Fever    (Consider location/radiation/quality/duration/timing/severity/associated sxs/prior treatment) HPI Comments: Onset 5 pm last night of fever, cough, sore throat and body aches. Has been hot and cold. Temp last night 99.8 at symptom onset. Cough is nonproductive. She has a hx of asthma and has had wheezing and dyspnea. Albuterol treatments have helped. She took one dose of Tylenol yesterday but none since then.    Past Medical History  Diagnosis Date  . Hypertension   . Asthma     History reviewed. No pertinent past surgical history.  History reviewed. No pertinent family history.  History  Substance Use Topics  . Smoking status: Current Everyday Smoker  . Smokeless tobacco: Not on file  . Alcohol Use:     OB History    Grav Para Term Preterm Abortions TAB SAB Ect Mult Living                  Review of Systems  Constitutional: Positive for fever and chills. Negative for fatigue.  HENT: Positive for congestion, sore throat and rhinorrhea. Negative for ear pain, sneezing, postnasal drip and sinus pressure.   Respiratory: Positive for cough, shortness of breath and wheezing.   Cardiovascular: Negative for chest pain and palpitations.  Gastrointestinal: Negative for nausea, vomiting, abdominal pain and diarrhea.    Allergies  Review of patient's allergies indicates no known allergies.  Home Medications   Current Outpatient Rx  Name Route Sig Dispense Refill  . ALBUTEROL SULFATE (5 MG/ML) 0.5% IN NEBU Nebulization Take 2.5 mg by nebulization every 6 (six) hours as needed.      Marland Kitchen METOPROLOL SUCCINATE ER 50 MG PO TB24 Oral Take 50 mg by mouth daily.      . ACETAMINOPHEN-CODEINE #3 300-30 MG PO TABS Oral Take 1-2 tablets by mouth every 6 (six) hours as needed for pain. 15 tablet 0  . PREDNISONE 20 MG PO TABS Oral Take 1 tablet (20 mg total) by  mouth 2 (two) times daily. 10 tablet 0    BP 160/88  Pulse 118  Temp(Src) 100.8 F (38.2 C) (Oral)  Resp 26  SpO2 98%  Physical Exam  Nursing note and vitals reviewed. Constitutional: She appears well-developed and well-nourished.       NAD, but appears ill, lying on exam table  HENT:  Head: Normocephalic and atraumatic.  Right Ear: Tympanic membrane, external ear and ear canal normal.  Left Ear: Tympanic membrane, external ear and ear canal normal.  Nose: Nose normal.  Mouth/Throat: Uvula is midline, oropharynx is clear and moist and mucous membranes are normal. No oropharyngeal exudate, posterior oropharyngeal edema or posterior oropharyngeal erythema.  Neck: Neck supple.  Cardiovascular: Normal rate, regular rhythm and normal heart sounds.   Pulmonary/Chest: Effort normal. No respiratory distress. She has decreased breath sounds. She has wheezes. She has no rhonchi. She has no rales.  Lymphadenopathy:    She has no cervical adenopathy.  Neurological: She is alert.  Skin: Skin is warm and dry.  Psychiatric: She has a normal mood and affect.    ED Course  Procedures (including critical care time)  Labs Reviewed - No data to display Dg Chest 2 View  09/01/2011  *RADIOLOGY REPORT*  Clinical Data: Cough, body aches, shaking, history smoking, pneumonia, and bronchitis  CHEST - 2 VIEW  Comparison: 06/16/2011  Findings: Upper-normal  size of cardiac silhouette. Calcified tortuous aorta. Pulmonary vascularity normal. Chronic peribronchial thickening without infiltrate or effusion. No pneumothorax. Bones unremarkable.  IMPRESSION: Chronic bronchitic changes.  Original Report Authenticated By: Lollie Marrow, M.D.     1. Flu-like symptoms   2. Asthma exacerbation       MDM  Scattered wheeze persisted after 1st NMT, pulse ox 95%. After 2nd NMT pt sitting upright, reports symptomatic improvement, distant BS bilat but CTA. Pulse ox improved to 98%.        Melody Comas,  [place] 09/01/11 [phone]

## 2011-09-10 ENCOUNTER — Encounter (HOSPITAL_COMMUNITY): Payer: Self-pay

## 2011-09-10 ENCOUNTER — Emergency Department (HOSPITAL_COMMUNITY)
Admission: EM | Admit: 2011-09-10 | Discharge: 2011-09-10 | Disposition: A | Discharge status: Home / Self Care | Payer: Self-pay | Attending: Emergency Medicine | Admitting: Emergency Medicine

## 2011-09-10 DIAGNOSIS — L03119 Cellulitis of unspecified part of limb: Secondary | ICD-10-CM

## 2011-09-10 DIAGNOSIS — Z79899 Other long term (current) drug therapy: Secondary | ICD-10-CM | POA: Insufficient documentation

## 2011-09-10 DIAGNOSIS — M79609 Pain in unspecified limb: Secondary | ICD-10-CM | POA: Insufficient documentation

## 2011-09-10 DIAGNOSIS — L02419 Cutaneous abscess of limb, unspecified: Secondary | ICD-10-CM | POA: Insufficient documentation

## 2011-09-10 DIAGNOSIS — I10 Essential (primary) hypertension: Secondary | ICD-10-CM | POA: Insufficient documentation

## 2011-09-10 DIAGNOSIS — J45909 Unspecified asthma, uncomplicated: Secondary | ICD-10-CM | POA: Insufficient documentation

## 2011-09-10 DIAGNOSIS — F17200 Nicotine dependence, unspecified, uncomplicated: Secondary | ICD-10-CM | POA: Insufficient documentation

## 2011-09-10 MED ORDER — SULFAMETHOXAZOLE-TRIMETHOPRIM 800-160 MG PO TABS: 1.0000 | ORAL_TABLET | Freq: Two times a day (BID) | ORAL | Status: AC

## 2011-09-10 MED ORDER — TRAMADOL HCL 50 MG PO TABS: 50.0000 mg | ORAL_TABLET | Freq: Four times a day (QID) | ORAL | Status: AC | PRN

## 2011-09-10 MED ORDER — CEPHALEXIN 500 MG PO CAPS: 500.0000 mg | ORAL_CAPSULE | Freq: Four times a day (QID) | ORAL | Status: AC

## 2011-09-10 NOTE — ED Notes (Signed)
Grapefruit sized induration noted to left posterior thigh that began yesterday and has increased in size and redness. No drainage nioted

## 2011-09-10 NOTE — ED Provider Notes (Signed)
History     CSN: [id number]  Arrival date & time 09/10/11  1616   First MD Initiated Contact with Patient 09/10/11 1825      Chief Complaint  Patient presents with  . Leg Pain  . Abscess    (Consider location/radiation/quality/duration/timing/severity/associated sxs/prior treatment) Patient is a 55 y.o. female presenting with leg pain. The history is provided by the patient.  Leg Pain  The incident occurred yesterday. The incident occurred at home. There was no injury mechanism. The pain is present in the left thigh. The pain is moderate. The pain has been worsening since onset. Pertinent negatives include no numbness, no inability to bear weight, no loss of motion, no muscle weakness, no loss of sensation and no tingling. Associated symptoms comments: erythema. The symptoms are aggravated by palpation. She has tried nothing for the symptoms. The treatment provided no relief.   patient reports sudden onset yesterday of soreness to her left inner thigh with no known injury. It is associated with erythema. There is no drainage. She also denies any fever or chills.  Past Medical History  Diagnosis Date  . Hypertension   . Asthma     Past Surgical History  Procedure Date  . Abdominal hysterectomy     History reviewed. No pertinent family history.  History  Substance Use Topics  . Smoking status: Current Everyday Smoker  . Smokeless tobacco: Not on file  . Alcohol Use: No    Review of Systems  Constitutional: Negative for fever and chills.  Gastrointestinal: Negative for nausea, vomiting and abdominal pain.  Musculoskeletal: Negative for back pain and gait problem.  Skin: Positive for color change. Negative for rash and wound.  Neurological: Negative for tingling, weakness and numbness.    Allergies  Review of patient's allergies indicates no known allergies.  Home Medications   Current Outpatient Rx  Name Route Sig Dispense Refill  . ACETAMINOPHEN-CODEINE #3  300-30 MG PO TABS Oral Take 1-2 tablets by mouth every 6 (six) hours as needed for pain. 15 tablet 0  . ALBUTEROL SULFATE (5 MG/ML) 0.5% IN NEBU Nebulization Take 2.5 mg by nebulization every 6 (six) hours as needed.      Marland Kitchen METOPROLOL SUCCINATE ER 50 MG PO TB24 Oral Take 50 mg by mouth daily.      Marland Kitchen PREDNISONE 20 MG PO TABS Oral Take 1 tablet (20 mg total) by mouth 2 (two) times daily. 10 tablet 0    BP 142/76  Pulse 84  Temp(Src) 97.5 F (36.4 C) (Oral)  Resp 20  SpO2 95%  Physical Exam  Nursing note and vitals reviewed. Constitutional: She is oriented to person, place, and time. She appears well-developed and well-nourished. No distress.  HENT:  Head: Normocephalic and atraumatic.  Right Ear: External ear normal.  Left Ear: External ear normal.  Nose: Nose normal.  Eyes: EOM are normal. Pupils are equal, round, and reactive to light.  Neck: Normal range of motion. Neck supple.  Cardiovascular: Normal rate, regular rhythm and intact distal pulses.   Pulmonary/Chest: Effort normal and breath sounds normal. No respiratory distress.  Abdominal: Soft. She exhibits no distension. There is no tenderness.  Musculoskeletal: She exhibits no edema.       See skin exam   Neurological: She is alert and oriented to person, place, and time. Coordination normal.  Skin: Skin is warm and dry.     Psychiatric: Her behavior is normal.    ED Course  Procedures (including critical care time)  Labs  Reviewed - No data to display No results found.   1. Cellulitis of thigh       MDM  Erythematous area with TTP suspicious for cellulitis. Bedside ultrasound demonstrates no pus collection that can be drained. Pt will be put on abx and has been instructed to return for recheck if spreading erythema or worsening pain.        [address] [place], [place] 09/11/11 [phone]

## 2011-09-10 NOTE — ED Notes (Signed)
Red area to left inner thigh

## 2011-09-10 NOTE — ED Notes (Signed)
Soreness started yesterday- no drainage

## 2011-09-11 NOTE — ED Provider Notes (Signed)
Medical screening examination/treatment/procedure(s) were performed by non-physician practitioner and as supervising physician I was immediately available for consultation/collaboration. Ramsha Lonigro Y.   Gavin Pound. Sallyann Kinnaird, MD 09/11/11 1031

## 2011-09-14 NOTE — ED Provider Notes (Signed)
Medical screening examination/treatment/procedure(s) were performed by non-physician practitioner and as supervising physician I was immediately available for consultation/collaboration.  LYKINS,KIMBERLY G  D.O.    Randa Spike, MD 09/14/11 [phone]

## 2012-04-24 ENCOUNTER — Emergency Department (HOSPITAL_COMMUNITY): Payer: Self-pay

## 2012-04-24 ENCOUNTER — Other Ambulatory Visit: Payer: Self-pay

## 2012-04-24 ENCOUNTER — Emergency Department (HOSPITAL_COMMUNITY)
Admission: EM | Admit: 2012-04-24 | Discharge: 2012-04-25 | Disposition: A | Discharge status: Home / Self Care | Payer: Self-pay | Attending: Emergency Medicine | Admitting: Emergency Medicine

## 2012-04-24 ENCOUNTER — Encounter (HOSPITAL_COMMUNITY): Payer: Self-pay | Admitting: Emergency Medicine

## 2012-04-24 DIAGNOSIS — I10 Essential (primary) hypertension: Secondary | ICD-10-CM | POA: Insufficient documentation

## 2012-04-24 DIAGNOSIS — Z9089 Acquired absence of other organs: Secondary | ICD-10-CM | POA: Insufficient documentation

## 2012-04-24 DIAGNOSIS — E079 Disorder of thyroid, unspecified: Secondary | ICD-10-CM | POA: Insufficient documentation

## 2012-04-24 DIAGNOSIS — J4489 Other specified chronic obstructive pulmonary disease: Secondary | ICD-10-CM | POA: Insufficient documentation

## 2012-04-24 DIAGNOSIS — Z79899 Other long term (current) drug therapy: Secondary | ICD-10-CM | POA: Insufficient documentation

## 2012-04-24 DIAGNOSIS — F17200 Nicotine dependence, unspecified, uncomplicated: Secondary | ICD-10-CM | POA: Insufficient documentation

## 2012-04-24 DIAGNOSIS — J449 Chronic obstructive pulmonary disease, unspecified: Secondary | ICD-10-CM

## 2012-04-24 HISTORY — DX: Disorder of thyroid, unspecified: E07.9

## 2012-04-24 LAB — BASIC METABOLIC PANEL
BUN: 9 mg/dL (ref 6–23)
Chloride: 104 mEq/L (ref 96–112)
GFR calc Af Amer: >90 mL/min (ref >90)
GFR calc non Af Amer: >90 mL/min (ref >90)
Potassium: 4.2 mEq/L (ref 3.5–5.1)

## 2012-04-24 LAB — CBC WITH DIFFERENTIAL/PLATELET
Basophils Absolute: 0 10*3/uL (ref 0.0–0.1)
Basophils Relative: 0 % (ref 0–1)
Eosinophils Absolute: 0.4 10*3/uL (ref 0.0–0.7)
Hemoglobin: 16.1 g/dL — ABNORMAL HIGH (ref 12.0–15.0)
MCH: 27.7 pg (ref 26.0–34.0)
MCHC: 34.3 g/dL (ref 30.0–36.0)
Neutro Abs: 1.7 10*3/uL (ref 1.7–7.7)
Neutrophils Relative %: 26 % — ABNORMAL LOW (ref 43–77)
Platelets: 244 10*3/uL (ref 150–400)
RDW: 13.3 % (ref 11.5–15.5)

## 2012-04-24 LAB — POCT I-STAT TROPONIN I
Troponin i, poc: 0 ng/mL (ref 0.00–0.08)
Troponin i, poc: 0 ng/mL (ref 0.00–0.08)

## 2012-04-24 LAB — D-DIMER, QUANTITATIVE: D-Dimer, Quant: 0.32 ug/mL-FEU (ref 0.00–0.48)

## 2012-04-24 MED ORDER — ALBUTEROL SULFATE (5 MG/ML) 0.5% IN NEBU
5.0000 mg | INHALATION_SOLUTION | Freq: Once | RESPIRATORY_TRACT | Status: AC
  Administered 2012-04-24: 5 mg via RESPIRATORY_TRACT
  Filled 2012-04-24: qty 1

## 2012-04-24 MED ORDER — IPRATROPIUM BROMIDE 0.02 % IN SOLN
0.5000 mg | Freq: Once | RESPIRATORY_TRACT | Status: AC
  Administered 2012-04-24: 0.5 mg via RESPIRATORY_TRACT
  Filled 2012-04-24: qty 2.5

## 2012-04-24 MED ORDER — PREDNISONE 20 MG PO TABS
60.0000 mg | ORAL_TABLET | Freq: Once | ORAL | Status: AC
  Administered 2012-04-24: 60 mg via ORAL
  Filled 2012-04-24: qty 3

## 2012-04-24 NOTE — ED Provider Notes (Signed)
History     CSN: [id number]  Arrival date & time 04/24/12  2103   First MD Initiated Contact with Patient 04/24/12 2256      Chief Complaint  Patient presents with  . Chest Pain    (Consider location/radiation/quality/duration/timing/severity/associated sxs/prior treatment) HPI Comments: Reports anterior chest pain and bilateral rib pain with coughing for the past day. Her cough is dry nonproductive. She denies any fever, chills, nausea, vomiting or abdominal pain. No back pain. She's had pain like this in the past related to her asthma. She continues to smoke. She denies any change in her chronic cough. She denies any leg pain or swelling. She denies any cardiac history. Her pain is improved if she does not cough.  The history is provided by the patient.    Past Medical History  Diagnosis Date  . Hypertension   . Asthma   . Thyroid disease     Past Surgical History  Procedure Date  . Abdominal hysterectomy   . Appendectomy     No family history on file.  History  Substance Use Topics  . Smoking status: Current Everyday Smoker  . Smokeless tobacco: Not on file  . Alcohol Use: No    OB History    Grav Para Term Preterm Abortions TAB SAB Ect Mult Living                  Review of Systems  Constitutional: Negative for fever, activity change and appetite change.  HENT: Negative for congestion and rhinorrhea.   Respiratory: Positive for cough, chest tightness and shortness of breath.   Cardiovascular: Positive for chest pain.  Gastrointestinal: Negative for nausea, vomiting and abdominal pain.  Genitourinary: Negative for dysuria and hematuria.  Musculoskeletal: Negative for back pain.  Skin: Negative for rash.  Neurological: Negative for dizziness, weakness and headaches.    Allergies  Review of patient's allergies indicates no known allergies.  Home Medications   Current Outpatient Rx  Name Route Sig Dispense Refill  . ACETAMINOPHEN 325 MG PO TABS Oral  Take 650 mg by mouth every 6 (six) hours as needed. For pain    . ALBUTEROL SULFATE HFA 108 (90 BASE) MCG/ACT IN AERS Inhalation Inhale 2 puffs into the lungs every 6 (six) hours as needed. For shortness of breath and wheezing    . ALBUTEROL SULFATE (5 MG/ML) 0.5% IN NEBU Nebulization Take 2.5 mg by nebulization every 6 (six) hours as needed. For shortness of breath and wheezing    . GABAPENTIN 300 MG PO CAPS Oral Take 300 mg by mouth 3 (three) times daily as needed. For restless leg syndrome    . LISINOPRIL 20 MG PO TABS Oral Take 20 mg by mouth daily.    Marland Kitchen METHIMAZOLE 10 MG PO TABS Oral Take 10 mg by mouth 2 (two) times daily.    Marland Kitchen METOPROLOL SUCCINATE ER 50 MG PO TB24 Oral Take 50 mg by mouth daily.      . ADULT MULTIVITAMIN W/MINERALS CH Oral Take 1 tablet by mouth daily.    Marland Kitchen PRENATAL MULTIVITAMIN CH Oral Take 1 tablet by mouth daily.    . ALBUTEROL SULFATE HFA 108 (90 BASE) MCG/ACT IN AERS Inhalation Inhale 2 puffs into the lungs every 4 (four) hours as needed for wheezing. 1 each 0  . DOXYCYCLINE HYCLATE 100 MG PO CAPS Oral Take 1 capsule (100 mg total) by mouth 2 (two) times daily. 20 capsule 0  . PREDNISONE 50 MG PO TABS  1 tablet  PO daily 5 tablet 0    BP 133/74  Pulse 78  Temp 97.9 F (36.6 C) (Oral)  Resp 20  SpO2 90%  Physical Exam  Constitutional: She is oriented to person, place, and time. She appears well-developed and well-nourished. No distress.  HENT:  Head: Normocephalic and atraumatic.  Mouth/Throat: Oropharynx is clear and moist.  Eyes: Conjunctivae and EOM are normal. Pupils are equal, round, and reactive to light.  Neck: Normal range of motion. Neck supple.  Cardiovascular: Normal rate, regular rhythm and normal heart sounds.   No murmur heard. Pulmonary/Chest: Effort normal. No respiratory distress. She has wheezes. She exhibits tenderness.       Poor air exchange with scattered wheezes Reproducible anterior chest pain and bilateral rib pain  Abdominal: Soft.  There is no tenderness. There is no rebound and no guarding.  Musculoskeletal: Normal range of motion. She exhibits no edema and no tenderness.  Neurological: She is alert and oriented to person, place, and time. No cranial nerve deficit.  Skin: Skin is warm.    ED Course  Procedures (including critical care time)  Labs Reviewed  CBC WITH DIFFERENTIAL - Abnormal; Notable for the following:    RBC 5.81 (*)     Hemoglobin 16.1 (*)     HCT 46.9 (*)     Neutrophils Relative 26 (*)     Lymphocytes Relative 61 (*)     Eosinophils Relative 6 (*)     All other components within normal limits  BASIC METABOLIC PANEL - Abnormal; Notable for the following:    Glucose, Bld 106 (*)     All other components within normal limits  POCT I-STAT TROPONIN I  D-DIMER, QUANTITATIVE  POCT I-STAT TROPONIN I  LAB REPORT - SCANNED   Dg Chest 2 View  04/24/2012  *RADIOLOGY REPORT*  Clinical Data: Chest pain.  Short of breath.  Cough.  CHEST - 2 VIEW  Comparison: 06/16/2011.  Findings: Stable appearance of the chest.  Cardiopericardial silhouette is upper limits of normal for projection.  Lung volumes slightly lower than on prior.  There is no airspace disease.  No effusion.  Mediastinal contours appear within normal limits.  IMPRESSION: No acute abnormality.  Original Report Authenticated By: Andreas Newport, M.D.     1. COPD (chronic obstructive pulmonary disease)       MDM  Apparent musculoskeletal chest pain from coughing.  No change in chronic cough.  Poor air exchange with wheezing.  Chest x-ray negative. Delta troponin negative. D-dimer negative. Suspect COPD exacerbation with musculoskeletal chest pain. Treat with steroids, antibiotics, nebulizers. Patient ambulatory in the ED without desaturation.     Date: 04/24/2012  Rate: 74  Rhythm: normal sinus rhythm  QRS Axis: right  Intervals: normal  ST/T Wave abnormalities: normal  Conduction Disutrbances:none  Narrative Interpretation:    Old EKG Reviewed: unchanged    Glynn Octave, MD 04/25/12 [phone]

## 2012-04-24 NOTE — ED Notes (Signed)
PT. REPORTS MID CHEST PAIN , BILATERAL RIBCAGE PAIN , SOB , DRY COUGH AND DIAPHORESIS ONSET TODAY .

## 2012-04-25 MED ORDER — ALBUTEROL SULFATE HFA 108 (90 BASE) MCG/ACT IN AERS: 2.0000 | INHALATION_SPRAY | RESPIRATORY_TRACT | Status: DC | PRN

## 2012-04-25 MED ORDER — PREDNISONE 50 MG PO TABS: ORAL_TABLET | ORAL | Status: AC

## 2012-04-25 MED ORDER — DOXYCYCLINE HYCLATE 100 MG PO CAPS: 100.0000 mg | ORAL_CAPSULE | Freq: Two times a day (BID) | ORAL | Status: AC

## 2012-04-25 NOTE — ED Notes (Signed)
Notified EDP that pts O2 while ambulating was 90 on RA, pt denies SOB and dizziness

## 2012-08-23 IMAGING — CT CT HEAD W/O CM
1 of 2 series · 15 of 30 positions shown, 19 images · non-contrast
Comparison: None.

CLINICAL DATA: 53-year-old female with weakness and tremor.

CT HEAD WITHOUT CONTRAST
TECHNIQUE: Contiguous axial images were obtained from the base of
the skull through the vertex without contrast.

[Series 2: head routine 4.8 h37s · axial · 0.45mm/px · z∈[+1191,+1317]mm · 15 of 30 slices shown, 19 images]
[im 2/30  brain]
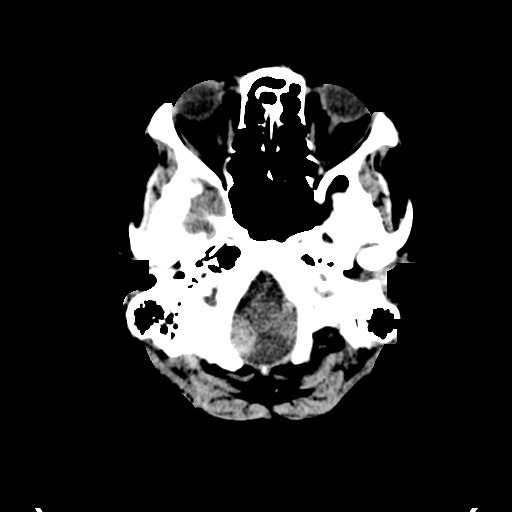
[im 2/30  bone]
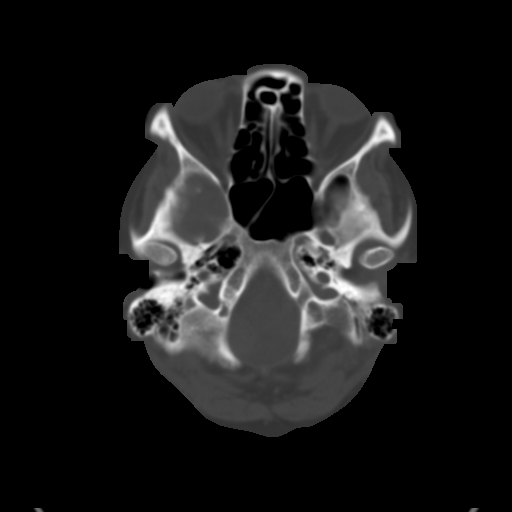
[im 4/30  brain]
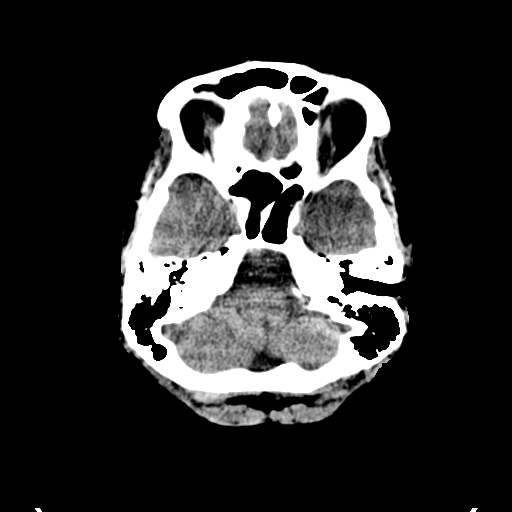
[im 5/30  brain]
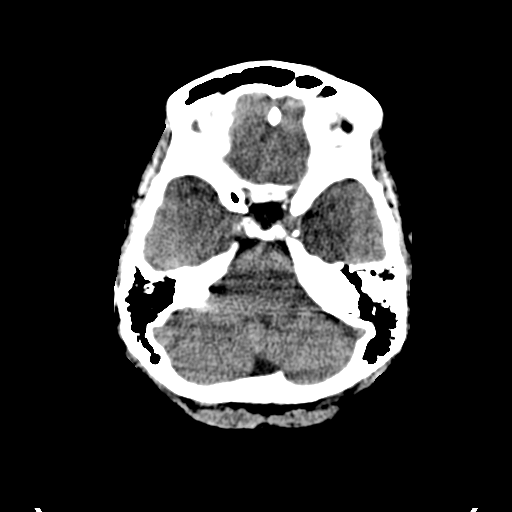
[im 8/30  brain]
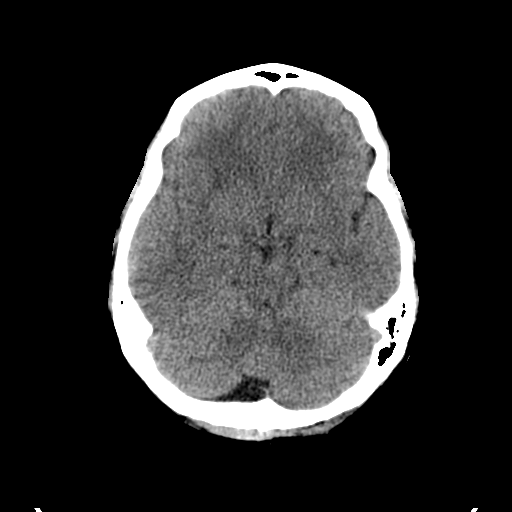
[im 9/30  brain]
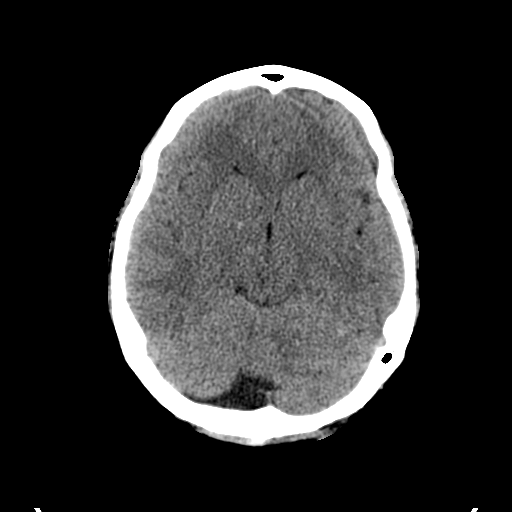
[im 9/30  bone]
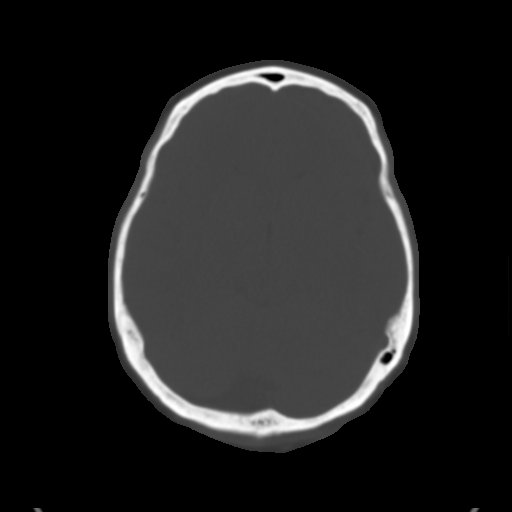
[im 11/30  brain]
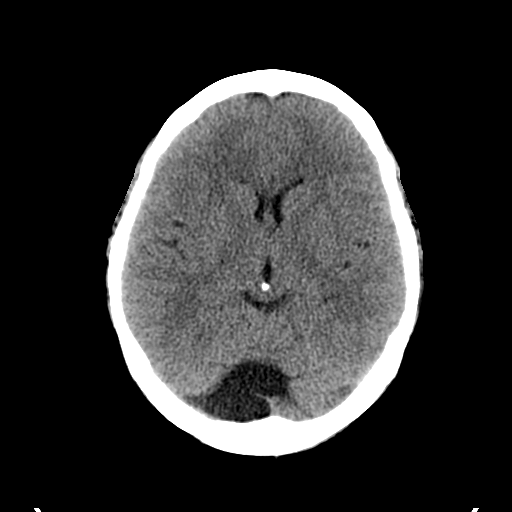
[im 13/30  brain]
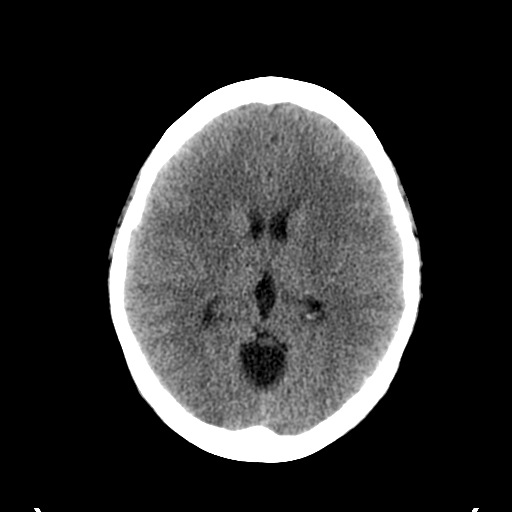
[im 15/30  brain]
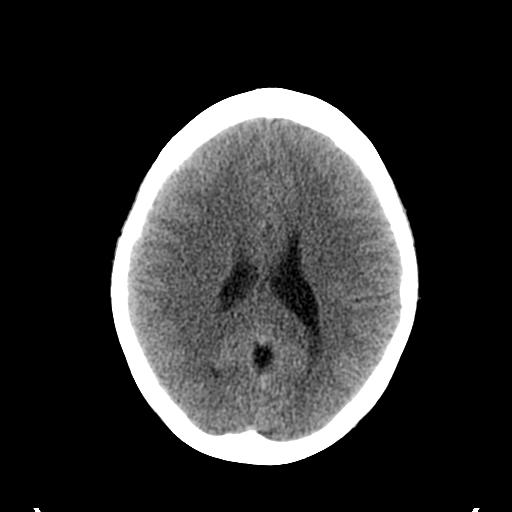
[im 17/30  brain]
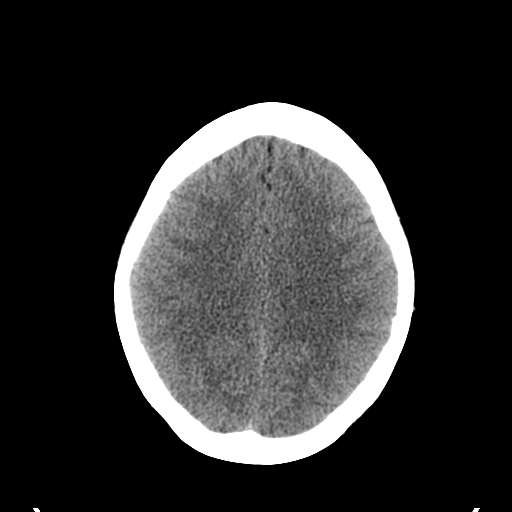
[im 17/30  bone]
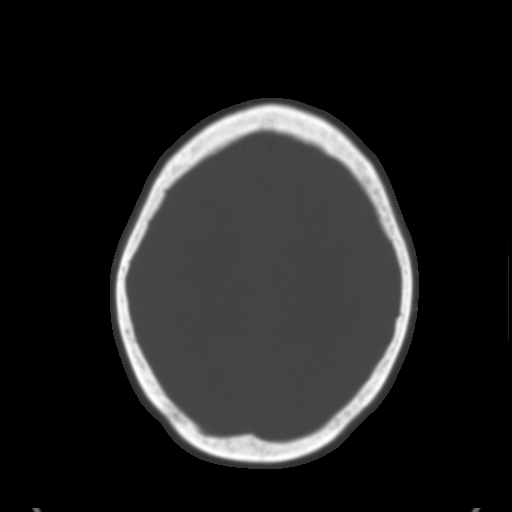
[im 19/30  brain]
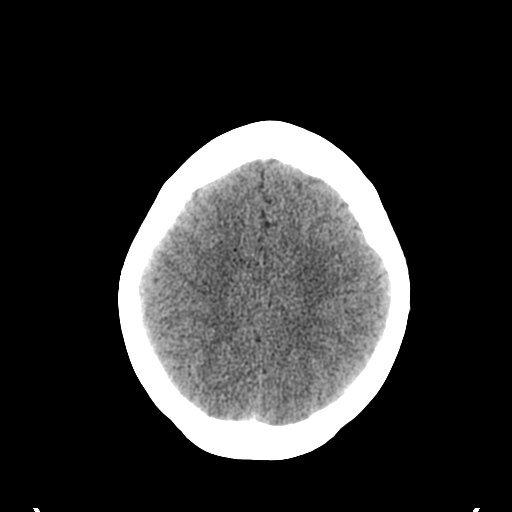
[im 21/30  brain]
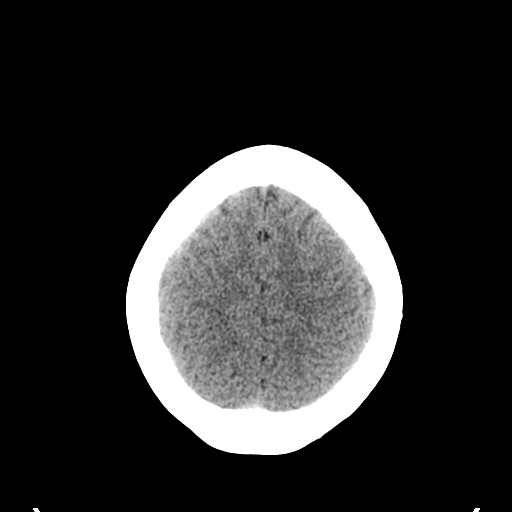
[im 22/30  brain]
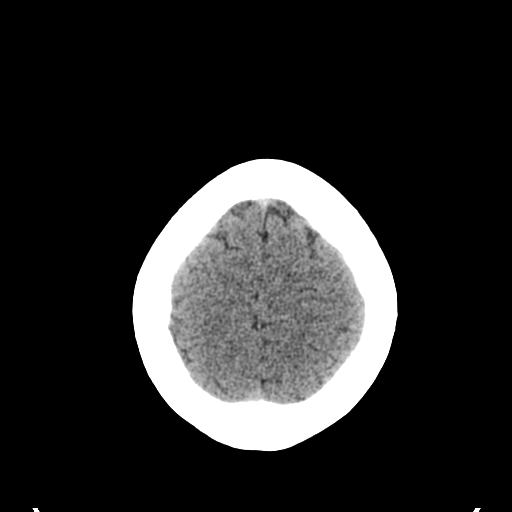
[im 25/30  brain]
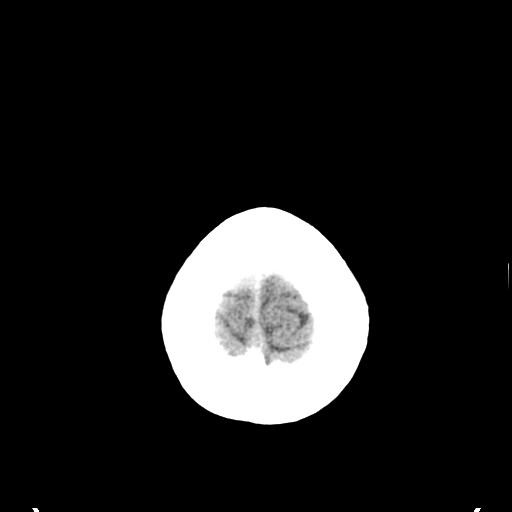
[im 25/30  bone]
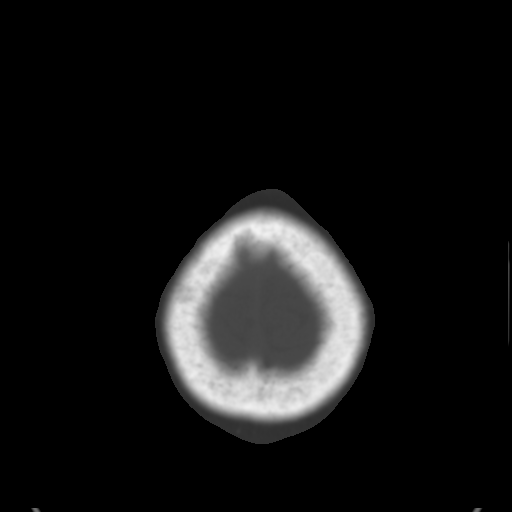
[im 26/30  brain]
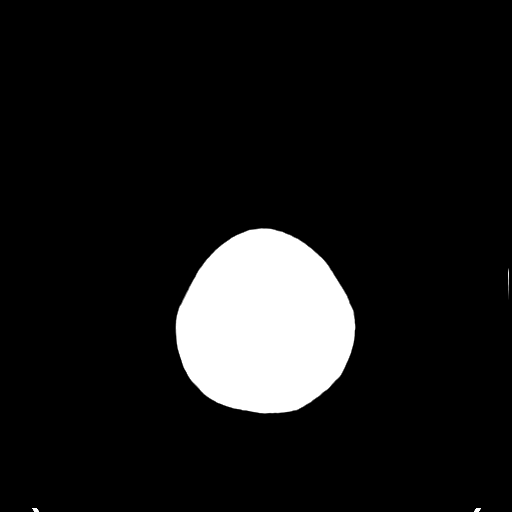
[im 28/30  brain]
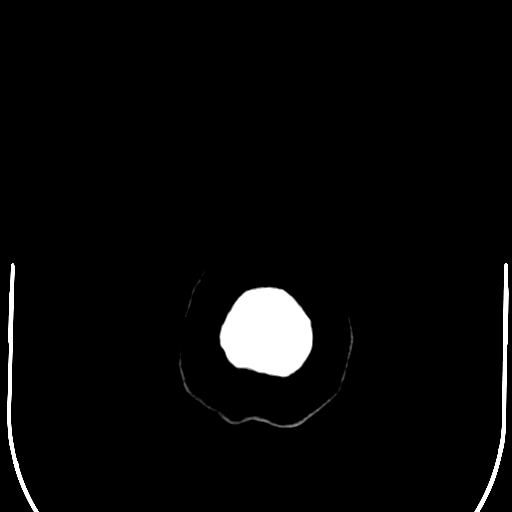

[15 of 30 positions shown; findings below may reference images not displayed]

FINDINGS: Visualized orbits and scalp soft tissues are within
normal limits.  Mild Calcified atherosclerosis at the skull base.
Visualized paranasal sinuses and mastoids are clear.  No acute
osseous abnormality identified.

CSF density space-occupying lesion associated with the tentorial
incisor.  Mild mass effect on the cerebellar vermis.  No associated
cerebral edema.  No other mass lesion.  No ventriculomegaly. No
acute intracranial hemorrhage identified.  No evidence of
cortically based acute infarction identified.  Normal cerebral
volume. No suspicious intracranial vascular hyperdensity.
IMPRESSION: 1.  Posterior fossa arachnoid cyst suspected, most likely
incidental.
2.  Otherwise normal noncontrast CT appearance of the brain.

## 2013-04-16 ENCOUNTER — Emergency Department (INDEPENDENT_AMBULATORY_CARE_PROVIDER_SITE_OTHER): Payer: BC Managed Care – PPO

## 2013-04-16 ENCOUNTER — Emergency Department (HOSPITAL_COMMUNITY)
Admission: EM | Admit: 2013-04-16 | Discharge: 2013-04-16 | Disposition: A | Discharge status: Home / Self Care | Payer: BC Managed Care – PPO | Source: Home / Self Care | Attending: Family Medicine | Admitting: Family Medicine

## 2013-04-16 ENCOUNTER — Encounter (HOSPITAL_COMMUNITY): Payer: Self-pay | Admitting: Emergency Medicine

## 2013-04-16 DIAGNOSIS — J40 Bronchitis, not specified as acute or chronic: Secondary | ICD-10-CM

## 2013-04-16 MED ORDER — ALBUTEROL SULFATE (5 MG/ML) 0.5% IN NEBU
5.0000 mg | INHALATION_SOLUTION | Freq: Once | RESPIRATORY_TRACT | Status: AC
  Administered 2013-04-16: 5 mg via RESPIRATORY_TRACT

## 2013-04-16 MED ORDER — PREDNISONE 20 MG PO TABS: ORAL_TABLET | ORAL | Status: DC

## 2013-04-16 MED ORDER — METHYLPREDNISOLONE SODIUM SUCC 125 MG IJ SOLR
125.0000 mg | Freq: Once | INTRAMUSCULAR | Status: AC
  Administered 2013-04-16: 125 mg via INTRAMUSCULAR

## 2013-04-16 MED ORDER — BENZONATATE 100 MG PO CAPS: 100.0000 mg | ORAL_CAPSULE | Freq: Three times a day (TID) | ORAL | Status: DC

## 2013-04-16 MED ORDER — IPRATROPIUM BROMIDE 0.02 % IN SOLN
0.5000 mg | Freq: Once | RESPIRATORY_TRACT | Status: AC
  Administered 2013-04-16: 0.5 mg via RESPIRATORY_TRACT

## 2013-04-16 MED ORDER — METHYLPREDNISOLONE SODIUM SUCC 125 MG IJ SOLR
INTRAMUSCULAR | Status: AC
  Filled 2013-04-16: qty 2

## 2013-04-16 MED ORDER — DOXYCYCLINE HYCLATE 100 MG PO CAPS: 100.0000 mg | ORAL_CAPSULE | Freq: Two times a day (BID) | ORAL | Status: DC

## 2013-04-16 MED ORDER — ALBUTEROL SULFATE (5 MG/ML) 0.5% IN NEBU
INHALATION_SOLUTION | RESPIRATORY_TRACT | Status: AC
  Filled 2013-04-16: qty 1

## 2013-04-16 MED ORDER — GUAIFENESIN-CODEINE 100-10 MG/5ML PO SOLN: 5.0000 mL | Freq: Three times a day (TID) | ORAL | Status: DC | PRN

## 2013-04-16 NOTE — ED Notes (Signed)
Returned pt's call in re: to doxycycline and guaifenesin

## 2013-04-16 NOTE — ED Provider Notes (Signed)
CSN: [id number]     Arrival date & time 04/16/13  1051 History     First MD Initiated Contact with Patient 04/16/13 1128     Chief Complaint  Patient presents with  . Asthma   (Consider location/radiation/quality/duration/timing/severity/associated sxs/prior Treatment) HPI Comments: 57 year old smoker female with history of asthma and hypothyroidism. Here complaining of increased wheezing and cough with yellow sputum production for the last 2 days. Reports left flank pain with deep inspiration and shortness of breath during wheezing spells. She has an inhaler and nebulizer machine at home last time she used albuterol was about 4 hours ago with some relief. Denies fever, chills or riders. No nausea or vomiting.   Past Medical History  Diagnosis Date  . Hypertension   . Asthma   . Thyroid disease    Past Surgical History  Procedure Laterality Date  . Abdominal hysterectomy    . Appendectomy     No family history on file. History  Substance Use Topics  . Smoking status: Current Every Day Smoker  . Smokeless tobacco: Not on file  . Alcohol Use: No   OB History   Grav Para Term Preterm Abortions TAB SAB Ect Mult Living                 Review of Systems  Constitutional: Negative for fever, chills, diaphoresis, appetite change and fatigue.  HENT: Positive for congestion. Negative for sore throat, rhinorrhea, trouble swallowing and sinus pressure.   Eyes: Negative for discharge.  Respiratory: Positive for cough, shortness of breath and wheezing.   Cardiovascular: Positive for chest pain. Negative for leg swelling.  Gastrointestinal: Negative for nausea, vomiting and abdominal pain.  Skin: Negative for rash.  Neurological: Negative for dizziness and headaches.  All other systems reviewed and are negative.    Allergies  Review of patient's allergies indicates no known allergies.  Home Medications   Current Outpatient Rx  Name  Route  Sig  Dispense  Refill  . albuterol  (PROVENTIL) (5 MG/ML) 0.5% nebulizer solution   Nebulization   Take 2.5 mg by nebulization every 6 (six) hours as needed. For shortness of breath and wheezing         . lisinopril (PRINIVIL,ZESTRIL) 20 MG tablet   Oral   Take 20 mg by mouth daily.         . methimazole (TAPAZOLE) 10 MG tablet   Oral   Take 10 mg by mouth 2 (two) times daily.         . metoprolol (TOPROL-XL) 50 MG 24 hr tablet   Oral   Take 50 mg by mouth daily.           Marland Kitchen acetaminophen (TYLENOL) 325 MG tablet   Oral   Take 650 mg by mouth every 6 (six) hours as needed. For pain         . albuterol (PROVENTIL HFA;VENTOLIN HFA) 108 (90 BASE) MCG/ACT inhaler   Inhalation   Inhale 2 puffs into the lungs every 4 (four) hours as needed for wheezing.   1 each   0   . benzonatate (TESSALON) 100 MG capsule   Oral   Take 1 capsule (100 mg total) by mouth every 8 (eight) hours.   21 capsule   0   . doxycycline (VIBRAMYCIN) 100 MG capsule   Oral   Take 1 capsule (100 mg total) by mouth 2 (two) times daily.   20 capsule   0   . gabapentin (NEURONTIN) 300 MG capsule  Oral   Take 300 mg by mouth 3 (three) times daily as needed. For restless leg syndrome         . guaiFENesin-codeine 100-10 MG/5ML syrup   Oral   Take 5 mLs by mouth 3 (three) times daily as needed for cough.   120 mL   0   . Multiple Vitamin (MULTIVITAMIN WITH MINERALS) TABS   Oral   Take 1 tablet by mouth daily.         . predniSONE (DELTASONE) 20 MG tablet      2 tabs po daily for 5 days.   10 tablet   0   . Prenatal Vit-Fe Fumarate-FA (PRENATAL MULTIVITAMIN) TABS   Oral   Take 1 tablet by mouth daily.          BP 163/88  Pulse 56  Temp(Src) 97 F (36.1 C) (Oral)  Resp 16  SpO2 92% Physical Exam  Nursing note and vitals reviewed. Constitutional: She is oriented to person, place, and time. She appears well-developed and well-nourished. No distress.  HENT:  Head: Normocephalic and atraumatic.  Nasal  Congestion with erythema and swelling of nasal turbinates, clear rhinorrhea. Mild pharyngeal erythema no exudates. No uvula deviation. No trismus. TM's normal.  Eyes: Conjunctivae are normal. Right eye exhibits no discharge. Left eye exhibits no discharge.  Neck: Neck supple. No JVD present.  Cardiovascular: Normal rate, regular rhythm and intact distal pulses.  Exam reveals no gallop and no friction rub.   No murmur heard. No low extremity edema  Pulmonary/Chest:  Decrease breath sounds bilaterally. Mild prolonged expiration. Impress fine crackles and rhonchi worse in the left base. No tachypnea, orthopnea or retractions.  Abdominal: Soft. There is no tenderness.  Lymphadenopathy:    She has no cervical adenopathy.  Neurological: She is alert and oriented to person, place, and time.  Skin: No rash noted. She is not diaphoretic.    ED Course   Procedures (including critical care time)  Labs Reviewed - No data to display Dg Chest 2 View  04/16/2013   *RADIOLOGY REPORT*  Clinical Data: Left flank pain.  Inspirational rhonchi in the left lung base.  Cough and shortness of breath.  CHEST - 2 VIEW  Comparison: Chest x-ray 04/24/2012.  Findings: Lung volumes are normal.  No consolidative airspace disease.  No pleural effusions.  No pneumothorax.  No pulmonary nodule or mass noted.  Pulmonary vasculature and the cardiomediastinal silhouette are within normal limits. Atherosclerosis in the thoracic aorta.  IMPRESSION: 1. No radiographic evidence of acute cardiopulmonary disease. 2.  Atherosclerosis.   Original Report Authenticated By: Trudie Reed, M.D.   1. Bronchitis     MDM  Treated here with solumedrol 80mg  IM x1. Albuterol /atrovent nebulization x1. persitent cough and lung exam improved after treatment. Prescribed prednisone, doxycycline and tessalon perls. Continue albuterol prn (pt. Has refills) Supportive care and red flags that should prompt her return to medical attention  discussed with patient and provided in writing.   Sharin Grave, MD 04/17/13 [phone]

## 2013-04-16 NOTE — ED Notes (Signed)
Pt c/o asthma onset 2 days... sxs include: CP, SOB, wheezing, cough w/yellow phlegm... Has been using her nebulizer machine w/temp relief... Smokes 0.5 PPD... Alert w/no signs of acute distress.

## 2013-04-16 NOTE — ED Notes (Signed)
LM on pt's VM... Pt called in re: to doxycycline and guaifenesin... States it's to expensive... Per Dr. Denyse Amass, that's the antibiotic she should be on.

## 2013-04-16 NOTE — ED Notes (Signed)
Chart review.

## 2013-07-12 IMAGING — CR DG CHEST 2V
2 series · 2 of 2 positions shown · non-contrast
Comparison: Plain films the chest 12/18/2010 and 06/04/2008.

CLINICAL DATA: Bronchitis.  Flu symptoms.  Back pain.  Smoker.

CHEST - 2 VIEW

[w chest pa]
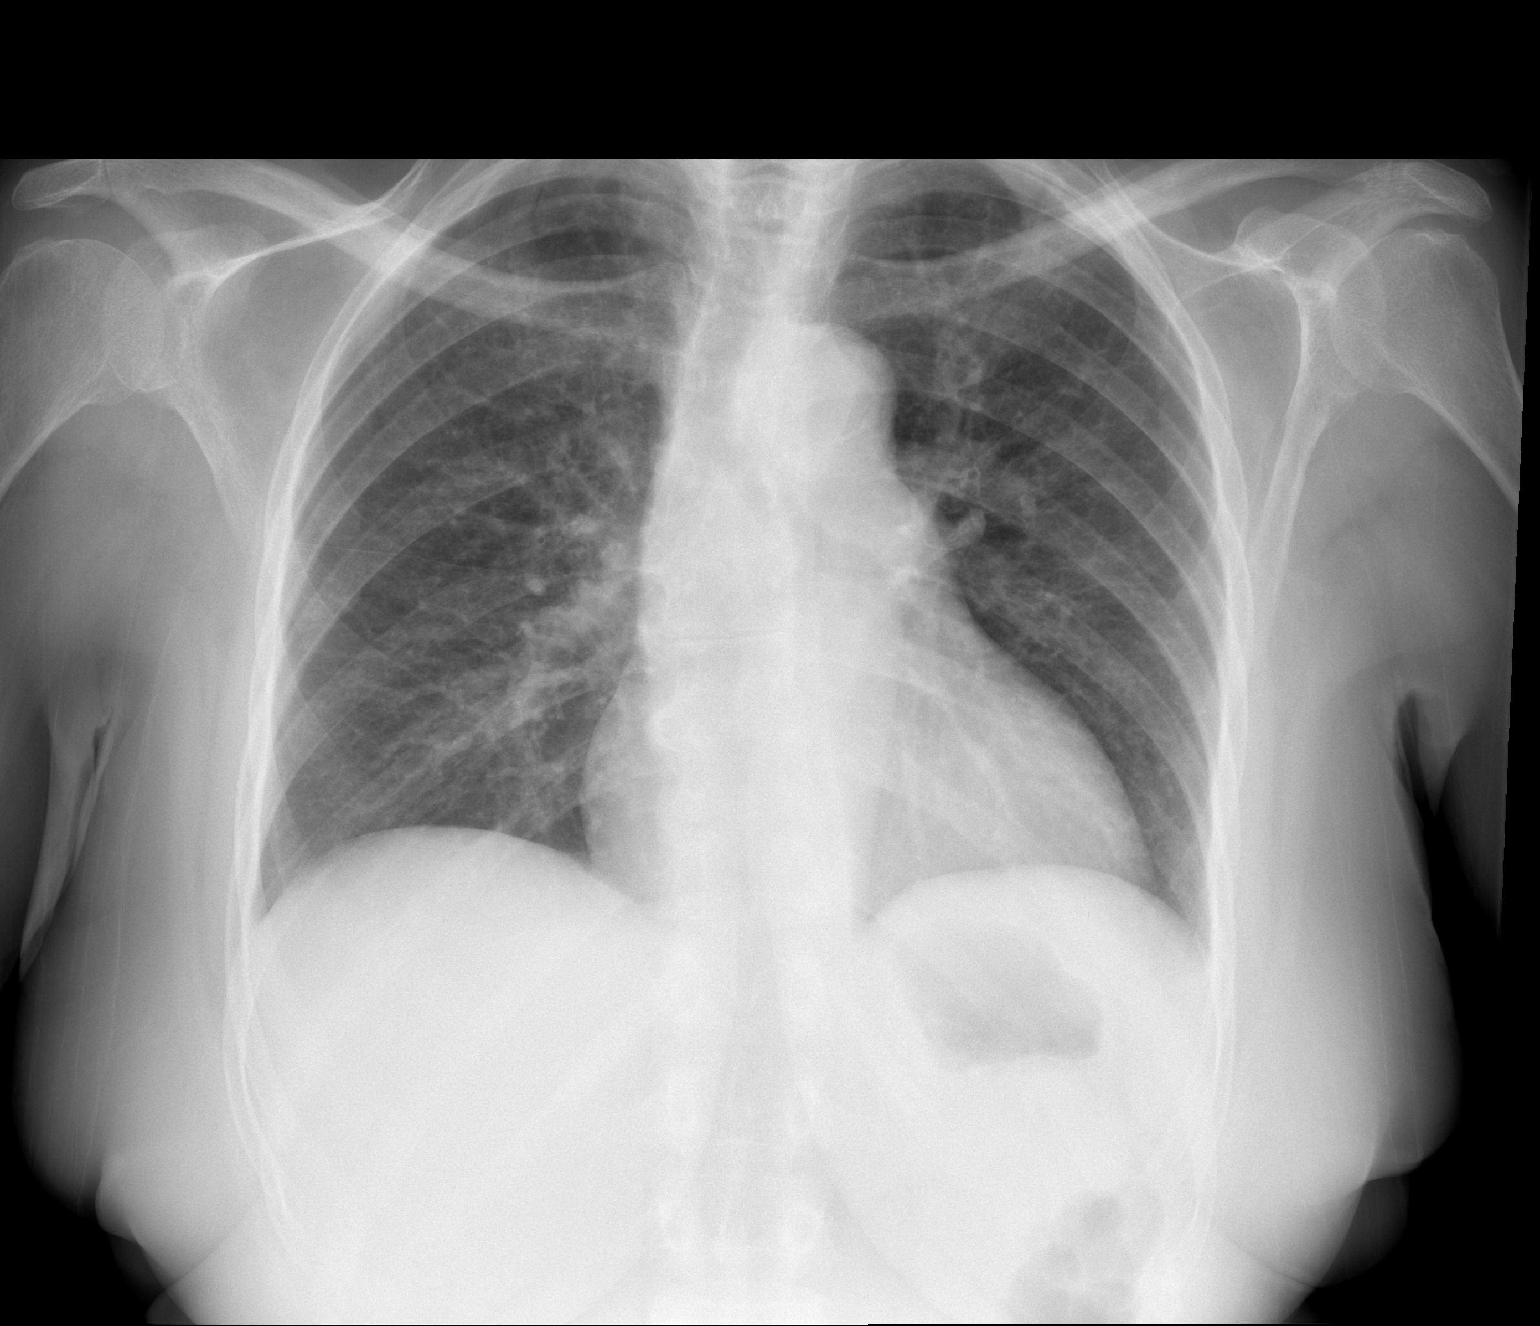

[w chest lat]
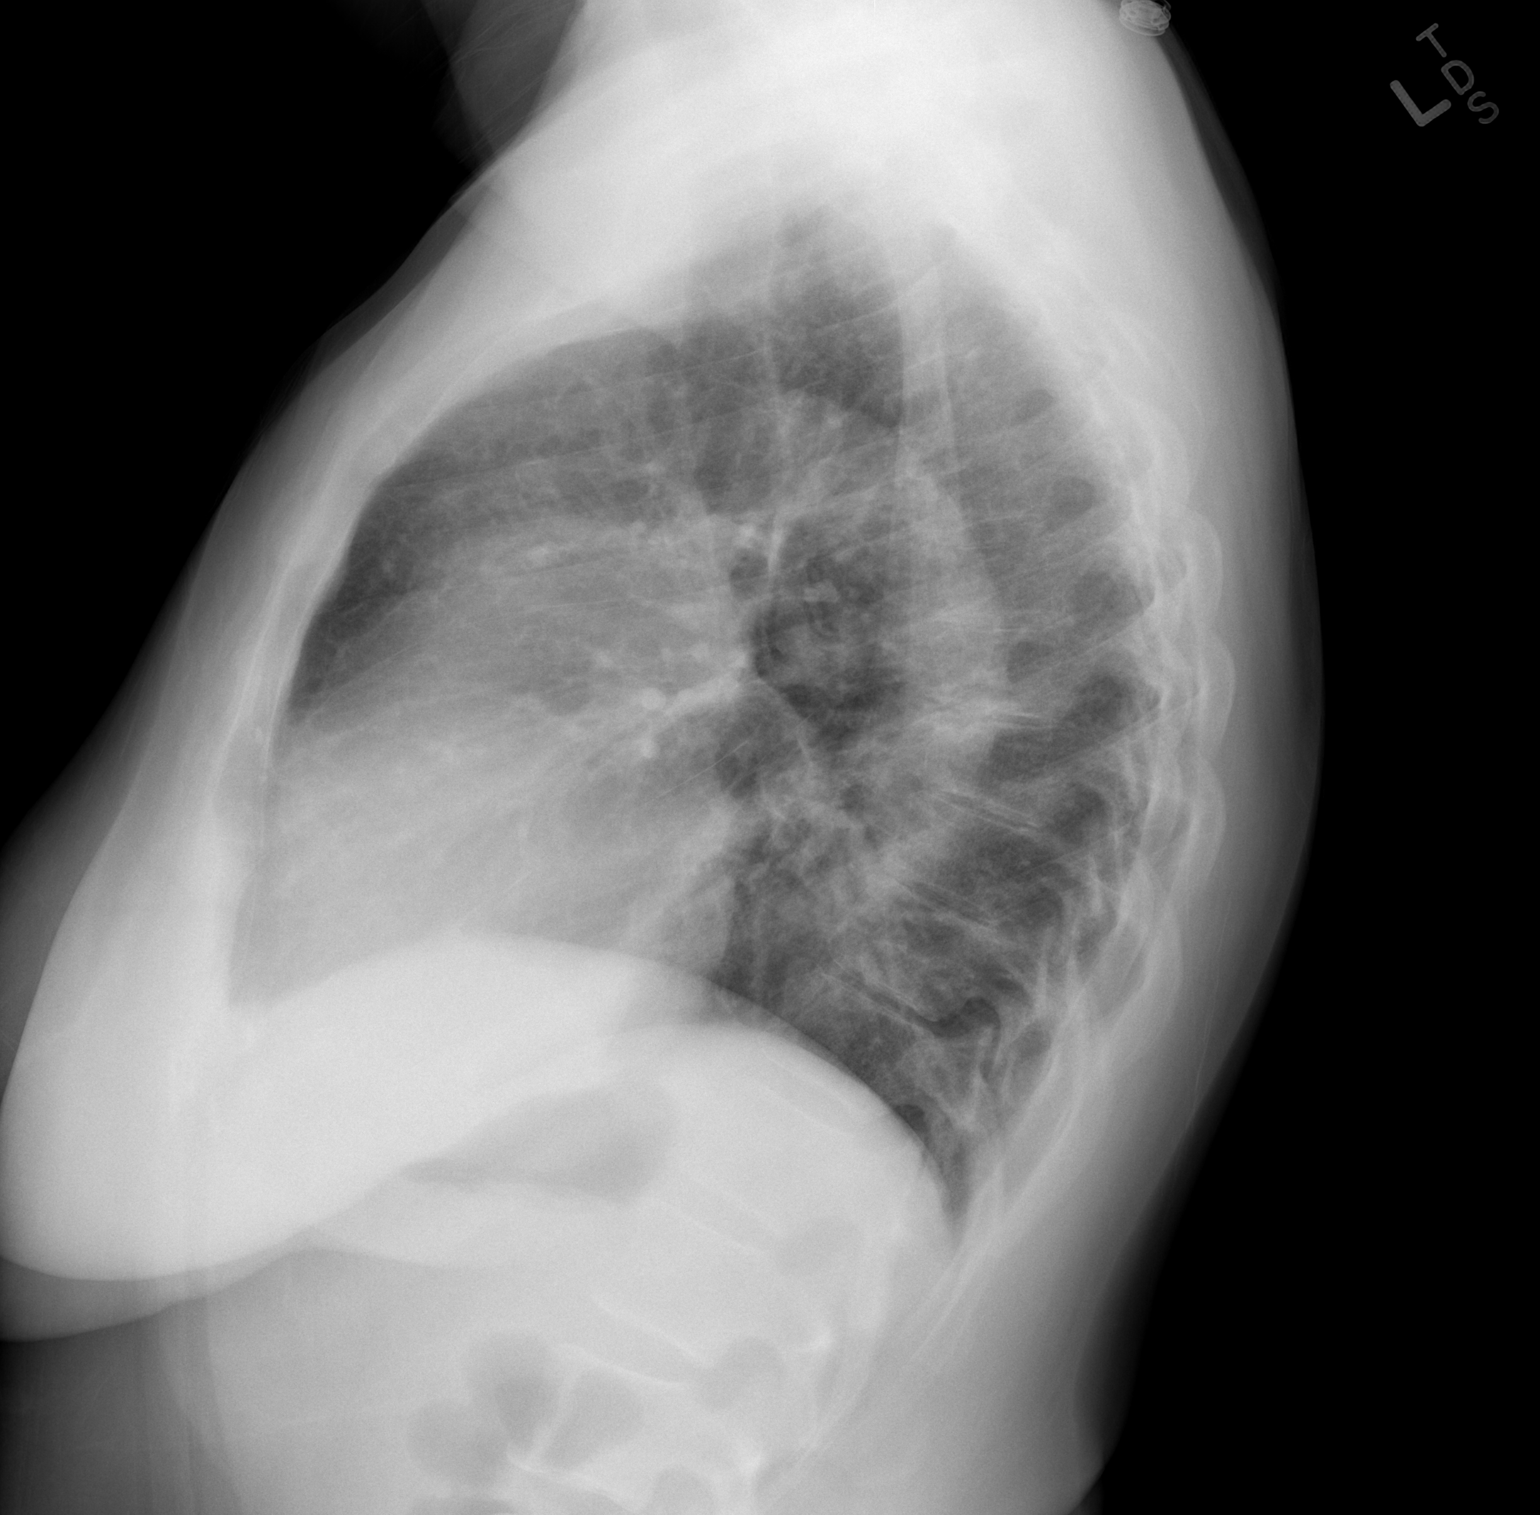

[2 of 2 positions shown; findings below may reference images not displayed]

FINDINGS: There is extensive peribronchial thickening.  No focal
airspace disease or effusion.  No pneumothorax.  Heart size upper
normal.
IMPRESSION: Findings compatible with bronchitis.  No focal process.

## 2013-11-10 ENCOUNTER — Emergency Department (HOSPITAL_COMMUNITY): Payer: BC Managed Care – PPO

## 2013-11-10 ENCOUNTER — Emergency Department (HOSPITAL_COMMUNITY)
Admission: EM | Admit: 2013-11-10 | Discharge: 2013-11-10 | Disposition: A | Discharge status: Home / Self Care | Payer: BC Managed Care – PPO | Attending: Emergency Medicine | Admitting: Emergency Medicine

## 2013-11-10 ENCOUNTER — Encounter (HOSPITAL_COMMUNITY): Payer: Self-pay | Admitting: Emergency Medicine

## 2013-11-10 DIAGNOSIS — F17200 Nicotine dependence, unspecified, uncomplicated: Secondary | ICD-10-CM | POA: Insufficient documentation

## 2013-11-10 DIAGNOSIS — E079 Disorder of thyroid, unspecified: Secondary | ICD-10-CM | POA: Insufficient documentation

## 2013-11-10 DIAGNOSIS — J45901 Unspecified asthma with (acute) exacerbation: Secondary | ICD-10-CM | POA: Insufficient documentation

## 2013-11-10 DIAGNOSIS — Z79899 Other long term (current) drug therapy: Secondary | ICD-10-CM | POA: Insufficient documentation

## 2013-11-10 DIAGNOSIS — J189 Pneumonia, unspecified organism: Secondary | ICD-10-CM

## 2013-11-10 DIAGNOSIS — J159 Unspecified bacterial pneumonia: Secondary | ICD-10-CM | POA: Insufficient documentation

## 2013-11-10 DIAGNOSIS — Z792 Long term (current) use of antibiotics: Secondary | ICD-10-CM | POA: Insufficient documentation

## 2013-11-10 DIAGNOSIS — IMO0002 Reserved for concepts with insufficient information to code with codable children: Secondary | ICD-10-CM | POA: Insufficient documentation

## 2013-11-10 DIAGNOSIS — I10 Essential (primary) hypertension: Secondary | ICD-10-CM | POA: Insufficient documentation

## 2013-11-10 MED ORDER — ALBUTEROL SULFATE (2.5 MG/3ML) 0.083% IN NEBU
5.0000 mg | INHALATION_SOLUTION | Freq: Once | RESPIRATORY_TRACT | Status: AC
  Administered 2013-11-10: 5 mg via RESPIRATORY_TRACT
  Filled 2013-11-10: qty 6

## 2013-11-10 MED ORDER — PREDNISONE 20 MG PO TABS
60.0000 mg | ORAL_TABLET | Freq: Once | ORAL | Status: AC
  Administered 2013-11-10: 60 mg via ORAL
  Filled 2013-11-10: qty 3

## 2013-11-10 MED ORDER — AZITHROMYCIN 250 MG PO TABS: ORAL_TABLET | ORAL | Status: DC

## 2013-11-10 MED ORDER — PREDNISONE 20 MG PO TABS: ORAL_TABLET | ORAL | Status: DC

## 2013-11-10 NOTE — ED Provider Notes (Addendum)
CSN: [id number]     Arrival date & time 11/10/13  1830 History   First MD Initiated Contact with Patient 11/10/13 1954     Chief Complaint  Patient presents with  . Cough  . Asthma     (Consider location/radiation/quality/duration/timing/severity/associated sxs/prior Treatment) HPI Comments: 58 yo female with smoking, htn, asthma hx presents with cough and sob.  Pt feels identical to previous asthma exac, no current steroids or abx.  Started  Yesterday, neb minimal improvement PTA.  Pt has medicines at home.  No fever. Mild sick contact with URI.  No cp in ED.  Non productive cough.  No recent surgeries or leg swelling/ pain.  Patient is a 58 y.o. female presenting with cough and asthma. The history is provided by the patient.  Cough Associated symptoms: shortness of breath and wheezing   Associated symptoms: no chest pain, no chills, no fever, no headaches and no rash   Asthma Associated symptoms include shortness of breath. Pertinent negatives include no chest pain, no abdominal pain and no headaches.    Past Medical History  Diagnosis Date  . Hypertension   . Asthma   . Thyroid disease    Past Surgical History  Procedure Laterality Date  . Abdominal hysterectomy    . Appendectomy     No family history on file. History  Substance Use Topics  . Smoking status: Current Every Day Smoker -- 0.50 packs/day    Types: Cigarettes  . Smokeless tobacco: Not on file  . Alcohol Use: No   OB History   Grav Para Term Preterm Abortions TAB SAB Ect Mult Living                 Review of Systems  Constitutional: Negative for fever and chills.  HENT: Negative for congestion.   Eyes: Negative for visual disturbance.  Respiratory: Positive for cough, shortness of breath and wheezing.   Cardiovascular: Negative for chest pain and leg swelling.  Gastrointestinal: Negative for vomiting and abdominal pain.  Genitourinary: Negative for dysuria and flank pain.  Musculoskeletal: Negative  for back pain, neck pain and neck stiffness.  Skin: Negative for rash.  Neurological: Negative for light-headedness and headaches.      Allergies  Review of patient's allergies indicates no known allergies.  Home Medications   Current Outpatient Rx  Name  Route  Sig  Dispense  Refill  . albuterol (PROVENTIL HFA;VENTOLIN HFA) 108 (90 BASE) MCG/ACT inhaler   Inhalation   Inhale 2 puffs into the lungs every 6 (six) hours as needed for wheezing or shortness of breath.         Marland Kitchen. albuterol (PROVENTIL) (5 MG/ML) 0.5% nebulizer solution   Nebulization   Take 2.5 mg by nebulization every 6 (six) hours as needed. For shortness of breath and wheezing         . gabapentin (NEURONTIN) 300 MG capsule   Oral   Take 300 mg by mouth 3 (three) times daily as needed. For restless leg syndrome         . lisinopril (PRINIVIL,ZESTRIL) 20 MG tablet   Oral   Take 10 mg by mouth daily.          . metoprolol (TOPROL-XL) 50 MG 24 hr tablet   Oral   Take 50 mg by mouth daily.           Marland Kitchen. EXPIRED: albuterol (PROVENTIL HFA;VENTOLIN HFA) 108 (90 BASE) MCG/ACT inhaler   Inhalation   Inhale 2 puffs into the lungs  every 4 (four) hours as needed for wheezing.   1 each   0   . azithromycin (ZITHROMAX Z-PAK) 250 MG tablet      Take 500 mg day one then 250 mg day 2-5.   6 each   0   . predniSONE (DELTASONE) 20 MG tablet      2 tabs po daily x 3 days   6 tablet   0    BP 139/85  Pulse 94  Temp(Src) 98.3 F (36.8 C) (Oral)  Resp 17  SpO2 92% Physical Exam  Nursing note and vitals reviewed. Constitutional: She is oriented to person, place, and time. She appears well-developed and well-nourished.  HENT:  Head: Normocephalic and atraumatic.  Eyes: Conjunctivae are normal. Right eye exhibits no discharge. Left eye exhibits no discharge.  Neck: Normal range of motion. Neck supple. No tracheal deviation present.  Cardiovascular: Normal rate and regular rhythm.   Pulmonary/Chest: Effort  normal. She has wheezes (exp bilateral).  Abdominal: Soft. She exhibits no distension. There is no tenderness. There is no guarding.  Musculoskeletal: She exhibits no edema and no tenderness.  Neurological: She is alert and oriented to person, place, and time.  Skin: Skin is warm. No rash noted.  Psychiatric: She has a normal mood and affect.    ED Course  Procedures (including critical care time) Labs Review Labs Reviewed - No data to display Imaging Review Dg Chest 2 View (if Patient Has Fever And/or Copd)  11/10/2013   CLINICAL DATA:  Cough and chest pain with history of asthma and tobacco use.  EXAM: CHEST  2 VIEW  COMPARISON:  DG CHEST 2 VIEW dated 04/16/2013  FINDINGS: The lungs are adequately inflated. The interstitial markings are increased bilaterally. The cardiopericardial silhouette is top-normal in size. The pulmonary vascularity is not engorged. There is no pleural effusion or pneumothorax. There is stable mild dextroscoliosis of the mid thoracic spine.  IMPRESSION: Increased interstitial density bilaterally is consistent with interstitial pneumonia and/or subsegmental atelectasis. There is no alveolar infiltrate. Followup films following therapy are recommended to assure clearing.   Electronically Signed   By: David  [place]   On: 11/10/2013 20:25     EKG Interpretation None      MDM   Final diagnoses:  Acute asthma exacerbation  Community acquired pneumonia    Well appearing. Clinically asthma.  No cp in ED.  Neb in ED. No increased work of breathing. Pt improved with neb, minimal wheeze.  Steroids po and fup outpt.   Results and differential diagnosis were discussed with the patient. Close follow up outpatient was discussed, patient comfortable with the plan.        Enid Skeens, MD 11/10/13 8119  Enid Skeens, MD 11/10/13 2105

## 2013-11-10 NOTE — ED Notes (Signed)
Pt present with c/o cough and asthma excerebration, states she used used albuterol at home with the nebulizer without getting relief. Reports cough induced chest pain, with mild shortness of breadth. Assessed expiratory wheezing bilaterally. Pt has no apparent signs of distress.

## 2013-11-12 ENCOUNTER — Encounter (HOSPITAL_COMMUNITY): Payer: Self-pay | Admitting: Emergency Medicine

## 2013-11-12 ENCOUNTER — Emergency Department (HOSPITAL_COMMUNITY)
Admission: EM | Admit: 2013-11-12 | Discharge: 2013-11-12 | Disposition: A | Discharge status: Home / Self Care | Payer: BC Managed Care – PPO | Attending: Emergency Medicine | Admitting: Emergency Medicine

## 2013-11-12 ENCOUNTER — Emergency Department (HOSPITAL_COMMUNITY): Payer: BC Managed Care – PPO

## 2013-11-12 DIAGNOSIS — F17200 Nicotine dependence, unspecified, uncomplicated: Secondary | ICD-10-CM | POA: Insufficient documentation

## 2013-11-12 DIAGNOSIS — Z79899 Other long term (current) drug therapy: Secondary | ICD-10-CM | POA: Insufficient documentation

## 2013-11-12 DIAGNOSIS — E079 Disorder of thyroid, unspecified: Secondary | ICD-10-CM | POA: Insufficient documentation

## 2013-11-12 DIAGNOSIS — I10 Essential (primary) hypertension: Secondary | ICD-10-CM | POA: Insufficient documentation

## 2013-11-12 DIAGNOSIS — R0989 Other specified symptoms and signs involving the circulatory and respiratory systems: Secondary | ICD-10-CM | POA: Insufficient documentation

## 2013-11-12 DIAGNOSIS — R0602 Shortness of breath: Secondary | ICD-10-CM | POA: Insufficient documentation

## 2013-11-12 DIAGNOSIS — R05 Cough: Secondary | ICD-10-CM

## 2013-11-12 DIAGNOSIS — J189 Pneumonia, unspecified organism: Secondary | ICD-10-CM

## 2013-11-12 DIAGNOSIS — R059 Cough, unspecified: Secondary | ICD-10-CM

## 2013-11-12 DIAGNOSIS — F17210 Nicotine dependence, cigarettes, uncomplicated: Secondary | ICD-10-CM

## 2013-11-12 DIAGNOSIS — J45909 Unspecified asthma, uncomplicated: Secondary | ICD-10-CM | POA: Insufficient documentation

## 2013-11-12 LAB — CBC WITH DIFFERENTIAL/PLATELET
BASOS ABS: 0 10*3/uL (ref 0.0–0.1)
Basophils Relative: 0 % (ref 0–1)
EOS ABS: 0 10*3/uL (ref 0.0–0.7)
EOS PCT: 0 % (ref 0–5)
HCT: 41.6 % (ref 36.0–46.0)
Hemoglobin: 13.6 g/dL (ref 12.0–15.0)
LYMPHS PCT: 43 % (ref 12–46)
Lymphs Abs: 2.6 10*3/uL (ref 0.7–4.0)
MCH: 27.6 pg (ref 26.0–34.0)
MCHC: 32.7 g/dL (ref 30.0–36.0)
MCV: 84.6 fL (ref 78.0–100.0)
Monocytes Absolute: 0.6 10*3/uL (ref 0.1–1.0)
Monocytes Relative: 9 % (ref 3–12)
Neutro Abs: 2.9 10*3/uL (ref 1.7–7.7)
Neutrophils Relative %: 48 % (ref 43–77)
PLATELETS: 265 10*3/uL (ref 150–400)
RBC: 4.92 MIL/uL (ref 3.87–5.11)
RDW: 13.2 % (ref 11.5–15.5)
WBC: 6.2 10*3/uL (ref 4.0–10.5)

## 2013-11-12 LAB — BASIC METABOLIC PANEL
BUN: 20 mg/dL (ref 6–23)
CALCIUM: 9.4 mg/dL (ref 8.4–10.5)
CO2: 22 mEq/L (ref 19–32)
Chloride: 102 mEq/L (ref 96–112)
Creatinine, Ser: 0.73 mg/dL (ref 0.50–1.10)
GFR calc Af Amer: >90 mL/min (ref >90)
Glucose, Bld: 96 mg/dL (ref 70–99)
Potassium: 3.9 mEq/L (ref 3.7–5.3)
SODIUM: 140 meq/L (ref 137–147)

## 2013-11-12 MED ORDER — IPRATROPIUM-ALBUTEROL 0.5-2.5 (3) MG/3ML IN SOLN
3.0000 mL | Freq: Once | RESPIRATORY_TRACT | Status: AC
  Administered 2013-11-12: 3 mL via RESPIRATORY_TRACT
  Filled 2013-11-12: qty 3

## 2013-11-12 MED ORDER — HYDROCODONE-HOMATROPINE 5-1.5 MG/5ML PO SYRP: 5.0000 mL | ORAL_SOLUTION | Freq: Four times a day (QID) | ORAL | Status: DC | PRN

## 2013-11-12 MED ORDER — HYDROCODONE-HOMATROPINE 5-1.5 MG/5ML PO SYRP
5.0000 mL | ORAL_SOLUTION | Freq: Once | ORAL | Status: AC
  Administered 2013-11-12: 5 mL via ORAL
  Filled 2013-11-12: qty 5

## 2013-11-12 MED ORDER — STERILE WATER FOR INJECTION IJ SOLN
INTRAMUSCULAR | Status: AC
  Administered 2013-11-12: 2.1 mL
  Filled 2013-11-12: qty 10

## 2013-11-12 MED ORDER — CEFTRIAXONE SODIUM 1 G IJ SOLR
1.0000 g | Freq: Once | INTRAMUSCULAR | Status: AC
  Administered 2013-11-12: 1 g via INTRAMUSCULAR
  Filled 2013-11-12: qty 10

## 2013-11-12 MED ORDER — ALBUTEROL SULFATE HFA 108 (90 BASE) MCG/ACT IN AERS
2.0000 | INHALATION_SPRAY | RESPIRATORY_TRACT | Status: DC | PRN
  Administered 2013-11-12: 2 via RESPIRATORY_TRACT
  Filled 2013-11-12: qty 6.7

## 2013-11-12 NOTE — ED Provider Notes (Signed)
CSN: [id number]     Arrival date & time 11/12/13  1840 History   First MD Initiated Contact with Patient 11/12/13 2004     Chief Complaint  Patient presents with  . Cough  . Pneumonia     (Consider location/radiation/quality/duration/timing/severity/associated sxs/prior Treatment) HPI Pt is a 58yo female with hx of HTN, asthma, and thyroid disease dx 2 days ago with CAP, discharged home with azithromycin and prednisone, pt has albuterol inhaler and nebulizer machine at home. Pt took 2nd dose of azithromycin today but states she is not feeling any better. Pt states she feels like she is getting worse.  States she has an intermittent productive cough that keeps her up at night. Pt states "i can't sleep." Has tried albuterol at home w/o relief.  Denies fever, n/v/d. Pt is a current 0.5ppd tobacco smoker.  Denies chest pain.  States "the medicine needs to work faster."  Past Medical History  Diagnosis Date  . Hypertension   . Asthma   . Thyroid disease    Past Surgical History  Procedure Laterality Date  . Abdominal hysterectomy    . Appendectomy     No family history on file. History  Substance Use Topics  . Smoking status: Current Every Day Smoker -- 0.50 packs/day    Types: Cigarettes  . Smokeless tobacco: Not on file  . Alcohol Use: No   OB History   Grav Para Term Preterm Abortions TAB SAB Ect Mult Living                 Review of Systems  Constitutional: Negative for fever and chills.  HENT: Positive for congestion.   Respiratory: Positive for cough, shortness of breath and wheezing. Negative for stridor.   Cardiovascular: Negative for chest pain.  Gastrointestinal: Negative for nausea, vomiting, abdominal pain and diarrhea.  All other systems reviewed and are negative.      Allergies  Review of patient's allergies indicates no known allergies.  Home Medications   Current Outpatient Rx  Name  Route  Sig  Dispense  Refill  . albuterol (PROVENTIL HFA;VENTOLIN  HFA) 108 (90 BASE) MCG/ACT inhaler   Inhalation   Inhale 2 puffs into the lungs every 6 (six) hours as needed for wheezing or shortness of breath.         Marland Kitchen. albuterol (PROVENTIL) (5 MG/ML) 0.5% nebulizer solution   Nebulization   Take 2.5 mg by nebulization every 6 (six) hours as needed. For shortness of breath and wheezing         . azithromycin (ZITHROMAX Z-PAK) 250 MG tablet      Take 500 mg day one then 250 mg day 2-5.   6 each   0   . gabapentin (NEURONTIN) 300 MG capsule   Oral   Take 300 mg by mouth 3 (three) times daily as needed. For restless leg syndrome         . lisinopril (PRINIVIL,ZESTRIL) 10 MG tablet   Oral   Take 10 mg by mouth daily.         . metoprolol (TOPROL-XL) 50 MG 24 hr tablet   Oral   Take 50 mg by mouth daily.           . predniSONE (DELTASONE) 20 MG tablet      2 tabs po daily x 3 days   6 tablet   0   . EXPIRED: albuterol (PROVENTIL HFA;VENTOLIN HFA) 108 (90 BASE) MCG/ACT inhaler   Inhalation   Inhale 2  puffs into the lungs every 4 (four) hours as needed for wheezing.   1 each   0   . HYDROcodone-homatropine (HYCODAN) 5-1.5 MG/5ML syrup   Oral   Take 5 mLs by mouth every 6 (six) hours as needed for cough.   60 mL   0    BP 134/72  Pulse 84  Temp(Src) 98.4 F (36.9 C) (Oral)  Resp 20  SpO2 95% Physical Exam  Nursing note and vitals reviewed. Constitutional: She appears well-developed and well-nourished.  Pt appears sick, intermittent productive cough, but does not appear toxic.  HENT:  Head: Normocephalic and atraumatic.  Eyes: Conjunctivae are normal. No scleral icterus.  Neck: Normal range of motion. Neck supple.  Cardiovascular: Normal rate, regular rhythm and normal heart sounds.   Pulmonary/Chest: Effort normal. No respiratory distress. She has wheezes. She has rales. She exhibits no tenderness.  Intermittent productive cough. No respiratory distress. Able to speak in full sentences. No accessory muscle use.  Lungs: coarse breath sounds in all lung fields, worse on right side, expiratory wheeze.  Abdominal: Soft. Bowel sounds are normal. She exhibits no distension and no mass. There is no tenderness. There is no rebound and no guarding.  Soft, non-distended, non-tender  Musculoskeletal: Normal range of motion.  Neurological: She is alert.  Skin: Skin is warm and dry.    ED Course  Procedures (including critical care time) Labs Review Labs Reviewed  CBC WITH DIFFERENTIAL  BASIC METABOLIC PANEL   Imaging Review Dg Chest 2 View  11/12/2013   CLINICAL DATA:  Cough and shortness of breath.  Recent pneumonia.  EXAM: CHEST  2 VIEW  COMPARISON:  Two-view chest 11/10/2013.  FINDINGS: The heart size is normal. Persistent interstitial prominence is evident. No focal airspace consolidation is present. The visualized soft tissues and bony thorax are unremarkable.  IMPRESSION: 1. Persistent interstitial prominence. This is compatible with an interstitial pneumonitis or bronchitis. No focal airspace consolidation is evident. Recommend follow-up chest x-ray after symptoms have resolved.   Electronically Signed   By: Gennette Pac M.D.   On: 11/12/2013 20:27     EKG Interpretation None      MDM   Final diagnoses:  CAP (community acquired pneumonia)  Cough  Smokes 1/2 pack per day    pt with hx of asthma, dx with CAP 2 days ago presenting to ED c/o no improvement in her symptoms, including productive cough that keeps her up at night.  Pt took 2nd dose of azithromycin today.  Denies chest pain, n/v/d. Denies fevers.  Pt also given prednisone 2 days ago.  Still has 2 tabs left.  Pt appears sick, intermittent coughing during exam but does not appear toxic. No respiratory distress. O2-93-95% on RA.  Afebrile. No tachycardia or tachypnea.  On exam- Lungs: coarse breath sounds throughout, worse in right lung fields.  CXR: persistent interstitial prominence, consistent with current dx of CAP.  Labs: CBC and  BMP-WNL   Two duo-neb tx given in ED along with 1g IM rocephin and hycodan cough syrup.  Pt states she feels comfortable being discharged home.  Advised pt to continue taking prednisone and azithromycin until it is completed.  F/u with PCP next week.    Spent 5 minutes discussing smoking cessation with pt.  Pt states she knows she needs to stop but does not want to.  Return precautions provided. Pt verbalized understanding and agreement with tx plan.         Junius Finner, PA-C 11/12/13 2320

## 2013-11-12 NOTE — ED Notes (Signed)
Initial Contact - pt to RM 21 with family, reports recently seen and dx with PNA, taking medications as prescribed, however reports continued cough "can't sleep".  On this RN's arrival to room, pt reports "want to go home, feeling better".  Speaking full/clear sentences, laughing, rr even/un-lab, ls with faint exp wheeze noted. Pt reports nonprod congested cough.  Skin PWD, ambulatory with steady gait.  NAD. Awaiting EDP eval.

## 2013-11-12 NOTE — ED Provider Notes (Signed)
MSE was initiated and I personally evaluated the patient and placed orders (if any) at  8:00 PM on November 12, 2013.  The patient appears stable so that the remainder of the MSE may be completed by another provider.  Ambulatory to FT room, wheezing bilaterally. Duoneb, labs and x-ray ordered. Can wait to see provider.    Irish EldersKelly Miyuki Rzasa, NP 11/12/13 2001

## 2013-11-12 NOTE — ED Provider Notes (Signed)
Medical screening examination/treatment/procedure(s) were performed by non-physician practitioner and as supervising physician I was immediately available for consultation/collaboration.  Victor Granados L Noam Franzen, MD 11/12/13 2359 

## 2013-11-12 NOTE — ED Notes (Signed)
Pt states she was seen 2 days ago for difficulty breathing and asthma. Pt states she was told that she also has pneumonia. Pt states she has been taking prescribed meds but feels that she is not getting any better. Pt states she has a cough that keeps her "up all night". Pt alert, no acute distress. Skin warm and dry.

## 2013-11-14 NOTE — ED Provider Notes (Signed)
Medical screening examination/treatment/procedure(s) were performed by non-physician practitioner and as supervising physician I was immediately available for consultation/collaboration.   EKG Interpretation None       Flint MelterElliott L Argelia Formisano, MD 11/14/13 [phone]

## 2013-12-02 ENCOUNTER — Institutional Professional Consult (permissible substitution): Payer: BC Managed Care – PPO | Admitting: Internal Medicine

## 2014-02-05 ENCOUNTER — Emergency Department (HOSPITAL_COMMUNITY): Payer: No Typology Code available for payment source

## 2014-02-05 ENCOUNTER — Encounter (HOSPITAL_COMMUNITY): Payer: Self-pay | Admitting: Emergency Medicine

## 2014-02-05 ENCOUNTER — Inpatient Hospital Stay (HOSPITAL_COMMUNITY)
Admission: EM | Admit: 2014-02-05 | Discharge: 2014-02-07 | DRG: 190 | Disposition: A | Discharge status: Home / Self Care | Payer: No Typology Code available for payment source | Attending: Internal Medicine | Admitting: Internal Medicine

## 2014-02-05 DIAGNOSIS — J988 Other specified respiratory disorders: Secondary | ICD-10-CM

## 2014-02-05 DIAGNOSIS — J440 Chronic obstructive pulmonary disease with acute lower respiratory infection: Principal | ICD-10-CM | POA: Diagnosis present

## 2014-02-05 DIAGNOSIS — E876 Hypokalemia: Secondary | ICD-10-CM | POA: Diagnosis present

## 2014-02-05 DIAGNOSIS — J208 Acute bronchitis due to other specified organisms: Secondary | ICD-10-CM

## 2014-02-05 DIAGNOSIS — Z841 Family history of disorders of kidney and ureter: Secondary | ICD-10-CM

## 2014-02-05 DIAGNOSIS — R0602 Shortness of breath: Secondary | ICD-10-CM

## 2014-02-05 DIAGNOSIS — B9789 Other viral agents as the cause of diseases classified elsewhere: Secondary | ICD-10-CM

## 2014-02-05 DIAGNOSIS — J209 Acute bronchitis, unspecified: Principal | ICD-10-CM | POA: Diagnosis present

## 2014-02-05 DIAGNOSIS — J9600 Acute respiratory failure, unspecified whether with hypoxia or hypercapnia: Secondary | ICD-10-CM | POA: Diagnosis present

## 2014-02-05 DIAGNOSIS — I10 Essential (primary) hypertension: Secondary | ICD-10-CM | POA: Diagnosis present

## 2014-02-05 DIAGNOSIS — J4489 Other specified chronic obstructive pulmonary disease: Secondary | ICD-10-CM | POA: Diagnosis present

## 2014-02-05 DIAGNOSIS — J449 Chronic obstructive pulmonary disease, unspecified: Secondary | ICD-10-CM | POA: Diagnosis present

## 2014-02-05 DIAGNOSIS — IMO0002 Reserved for concepts with insufficient information to code with codable children: Secondary | ICD-10-CM

## 2014-02-05 DIAGNOSIS — Z8249 Family history of ischemic heart disease and other diseases of the circulatory system: Secondary | ICD-10-CM

## 2014-02-05 DIAGNOSIS — Z9089 Acquired absence of other organs: Secondary | ICD-10-CM

## 2014-02-05 DIAGNOSIS — R0902 Hypoxemia: Secondary | ICD-10-CM

## 2014-02-05 DIAGNOSIS — Z79899 Other long term (current) drug therapy: Secondary | ICD-10-CM

## 2014-02-05 DIAGNOSIS — F17200 Nicotine dependence, unspecified, uncomplicated: Secondary | ICD-10-CM | POA: Diagnosis present

## 2014-02-05 MED ORDER — ALBUTEROL SULFATE (2.5 MG/3ML) 0.083% IN NEBU
2.5000 mg | INHALATION_SOLUTION | Freq: Once | RESPIRATORY_TRACT | Status: AC
  Administered 2014-02-06: 2.5 mg via RESPIRATORY_TRACT
  Filled 2014-02-05: qty 3

## 2014-02-05 MED ORDER — IPRATROPIUM-ALBUTEROL 0.5-2.5 (3) MG/3ML IN SOLN
3.0000 mL | Freq: Once | RESPIRATORY_TRACT | Status: AC
  Administered 2014-02-06: 3 mL via RESPIRATORY_TRACT
  Filled 2014-02-05: qty 3

## 2014-02-05 MED ORDER — PREDNISONE 20 MG PO TABS
60.0000 mg | ORAL_TABLET | Freq: Once | ORAL | Status: AC
  Administered 2014-02-06: 60 mg via ORAL
  Filled 2014-02-05: qty 3

## 2014-02-05 NOTE — ED Notes (Signed)
Pt state she has had a cold since Sunday  Pt states she is coughing and has productive cough with yellow sputum with a tinge of blood noted in it  Pt states she is aching all over

## 2014-02-05 NOTE — ED Provider Notes (Signed)
CSN: [id number]     Arrival date & time 02/05/14  2018 History   None    This chart was scribed for non-physician practitioner, Antony Madura, PA-C working with Loren Racer, MD by Arlan Organ, ED Scribe. This patient was seen in room WTR9/WTR9 and the patient's care was started at 11:38 PM.   Chief Complaint  Patient presents with  . Asthma   The history is provided by the patient. No language interpreter was used.    HPI Comments: [name] is a 58 y.o. female with a PMHx HTN, Asthma, environmental allergies, and Thyroid disease who presents to the Emergency Department complaining of a constant, moderate productive cough consisting of yellow sputum x 4 days that is unchanged. She admits to mild streaks of blood in her sputum, SOB, chest discomfort, along with generalized body aches. She has tried her prescribed inhaler and nebulizer twice a day with mild temporary improvement. At this time she denies any fever, nasal congestion, vomiting, or rhinorrhea. No LOC. She has no other pertinent past medical history. No other concerns this visit.   Past Medical History  Diagnosis Date  . Hypertension   . Asthma   . Thyroid disease    Past Surgical History  Procedure Laterality Date  . Abdominal hysterectomy    . Appendectomy     Family History  Problem Relation Age of Onset  . Kidney failure Mother   . Heart attack Father    History  Substance Use Topics  . Smoking status: Current Every Day Smoker -- 0.50 packs/day    Types: Cigarettes  . Smokeless tobacco: Not on file  . Alcohol Use: No   OB History   Grav Para Term Preterm Abortions TAB SAB Ect Mult Living                  Review of Systems  Constitutional: Negative for fever and chills.  HENT: Negative for congestion and rhinorrhea.   Eyes: Negative for redness.  Respiratory: Positive for cough, chest tightness and shortness of breath.   Musculoskeletal: Positive for myalgias.  Skin: Negative for rash.   Psychiatric/Behavioral: Negative for confusion.     Allergies  Review of patient's allergies indicates no known allergies.  Home Medications   Prior to Admission medications   Medication Sig Start Date End Date Taking? Authorizing Provider  albuterol (PROVENTIL HFA;VENTOLIN HFA) 108 (90 BASE) MCG/ACT inhaler Inhale 2 puffs into the lungs every 4 (four) hours as needed for wheezing. 04/25/12 04/25/13  Glynn Octave, MD  albuterol (PROVENTIL HFA;VENTOLIN HFA) 108 (90 BASE) MCG/ACT inhaler Inhale 2 puffs into the lungs every 6 (six) hours as needed for wheezing or shortness of breath.    Historical Provider, MD  albuterol (PROVENTIL) (5 MG/ML) 0.5% nebulizer solution Take 2.5 mg by nebulization every 6 (six) hours as needed. For shortness of breath and wheezing    Historical Provider, MD  azithromycin (ZITHROMAX Z-PAK) 250 MG tablet Take 500 mg day one then 250 mg day 2-5. 11/10/13   Enid Skeens, MD  gabapentin (NEURONTIN) 300 MG capsule Take 300 mg by mouth 3 (three) times daily as needed. For restless leg syndrome    Historical Provider, MD  HYDROcodone-homatropine (HYCODAN) 5-1.5 MG/5ML syrup Take 5 mLs by mouth every 6 (six) hours as needed for cough. 11/12/13   Junius Finner, PA-C  lisinopril (PRINIVIL,ZESTRIL) 10 MG tablet Take 10 mg by mouth daily.    Historical Provider, MD  metoprolol (TOPROL-XL) 50 MG 24 hr tablet Take  50 mg by mouth daily.      Historical Provider, MD  predniSONE (DELTASONE) 20 MG tablet 2 tabs po daily x 3 days 11/10/13   Enid Skeens, MD   Triage Vitals: BP 156/92  Pulse 80  Temp(Src) 98.4 F (36.9 C) (Oral)  Resp 16  SpO2 93%   Physical Exam  Nursing note and vitals reviewed. Constitutional: She is oriented to person, place, and time. She appears well-developed and well-nourished. No distress.  Nontoxic/nonseptic appearing  HENT:  Head: Normocephalic and atraumatic.  Mouth/Throat: Oropharynx is clear and moist. No oropharyngeal exudate.  Eyes:  Conjunctivae and EOM are normal. Pupils are equal, round, and reactive to light. No scleral icterus.  Neck: Normal range of motion.  Cardiovascular: Normal rate, regular rhythm and normal heart sounds.   Pulmonary/Chest: Effort normal. No respiratory distress. She has no wheezes. She has no rales.  Mildly decreased breath sounds diffusely. No tachypnea or dyspnea. No wheezes or rales.   Abdominal: Soft. She exhibits no distension. There is no tenderness. There is no rebound and no guarding.  Soft, nontender  Musculoskeletal: Normal range of motion.  Neurological: She is alert and oriented to person, place, and time.  GCS 15. Speech is goal oriented.  Skin: Skin is warm and dry. No rash noted. She is not diaphoretic. No erythema. No pallor.  Psychiatric: She has a normal mood and affect. Her behavior is normal.    ED Course  Procedures (including critical care time)  DIAGNOSTIC STUDIES: Oxygen Saturation is 93% on RA, low by my interpretation.    COORDINATION OF CARE: 11:43 PM- Will give Proventil, Deltasone, and Duoneb. Will order DG chest 2 view.  Discussed treatment plan with pt at bedside and pt agreed to plan.     Labs Review Labs Reviewed  CBC WITH DIFFERENTIAL - Abnormal; Notable for the following:    WBC 10.9 (*)    Neutrophils Relative % 36 (*)    Lymphocytes Relative 51 (*)    Lymphs Abs 5.6 (*)    All other components within normal limits  BASIC METABOLIC PANEL - Abnormal; Notable for the following:    Potassium 3.4 (*)    Glucose, Bld 108 (*)    All other components within normal limits  D-DIMER, QUANTITATIVE  I-STAT TROPOININ, ED    Imaging Review Dg Chest 2 View (if Patient Has Fever And/or Copd)  02/05/2014   CLINICAL DATA:  Short of breath, cough, fever  EXAM: CHEST  2 VIEW  COMPARISON:  Prior chest x-ray 11/13/2011  FINDINGS: Stable cardiac and mediastinal contours. Trace atherosclerotic calcification in the transverse aorta. Mild prominence of the main  pulmonary artery remains stable. No focal airspace consolidation. Stable appearance of chronic bronchitic changes and mild interstitial prominence. Stable appearance of a nodular opacity in the medial right lung apex over multiple prior studies dating back to 2012 likely reflects a prominence of the adjacent osseous structures. No acute fracture or bony abnormality.  IMPRESSION: Stable chest x-ray without evidence of acute cardiopulmonary process.   Electronically Signed   By: Malachy Moan M.D.   On: 02/05/2014 21:59     Date: 02/06/2014  Rate: 62  Rhythm: normal sinus rhythm  QRS Axis: normal  Intervals: PR shortened (borderline)  ST/T Wave abnormalities: normal  Conduction Disutrbances:none  Narrative Interpretation: NSR; no STEMI or ischemic change  Old EKG Reviewed: no significant change since 04/24/2012 I have personally reviewed and interpreted this EKG.   MDM   Final diagnoses:  Shortness of breath  Hypoxia  Viral respiratory illness    58 year old female with a history of hypertension and asthma presents to the emergency department for chest tightness, cough productive of yellow sputum and tinged with small streaks of bright red bloody, as well as myalgias. Patient tried her albuterol inhaler and nebulizer treatments at home without relief. Patient today treated with DuoNeb x2 as well as oral steroids. Patient with oxygen saturations of 93% on arrival which dropped to 84% on room air despite nebulizer treatments. Him on reexamination, patient has good breath sounds diffusely without wheezes or rales. Workup today significant only for leukocytosis of 10.9. D-dimer negative. Chest x-ray without evidence of focal consolidation or pneumonia. Patient is afebrile and otherwise hemodynamically stable.  Suspected symptoms to be secondary to viral respiratory illness/viral bronchitis. Given progressive hypoxia, patient will be admitted for further treatment and evaluation. Case discussed  with Dr. Julian ReilGardner who will admit. Temp admit orders placed.  I personally performed the services described in this documentation, which was scribed in my presence. The recorded information has been reviewed and is accurate.    Antony MaduraKelly Caprice Wasko, [place] JerseyPA-C 02/06/14 [phone]

## 2014-02-06 DIAGNOSIS — J209 Acute bronchitis, unspecified: Secondary | ICD-10-CM

## 2014-02-06 DIAGNOSIS — R0902 Hypoxemia: Secondary | ICD-10-CM | POA: Diagnosis present

## 2014-02-06 DIAGNOSIS — R0602 Shortness of breath: Secondary | ICD-10-CM

## 2014-02-06 DIAGNOSIS — J208 Acute bronchitis due to other specified organisms: Secondary | ICD-10-CM | POA: Diagnosis present

## 2014-02-06 LAB — CBC WITH DIFFERENTIAL/PLATELET
Basophils Absolute: 0.1 10*3/uL (ref 0.0–0.1)
Basophils Relative: 1 % (ref 0–1)
EOS PCT: 4 % (ref 0–5)
Eosinophils Absolute: 0.4 10*3/uL (ref 0.0–0.7)
HCT: 43.2 % (ref 36.0–46.0)
Hemoglobin: 14.2 g/dL (ref 12.0–15.0)
Lymphocytes Relative: 51 % — ABNORMAL HIGH (ref 12–46)
Lymphs Abs: 5.6 10*3/uL — ABNORMAL HIGH (ref 0.7–4.0)
MCH: 28.2 pg (ref 26.0–34.0)
MCHC: 32.9 g/dL (ref 30.0–36.0)
MCV: 85.7 fL (ref 78.0–100.0)
MONOS PCT: 8 % (ref 3–12)
Monocytes Absolute: 0.9 10*3/uL (ref 0.1–1.0)
NEUTROS PCT: 36 % — AB (ref 43–77)
Neutro Abs: 3.9 10*3/uL (ref 1.7–7.7)
PLATELETS: 276 10*3/uL (ref 150–400)
RBC: 5.04 MIL/uL (ref 3.87–5.11)
RDW: 13.3 % (ref 11.5–15.5)
WBC: 10.9 10*3/uL — AB (ref 4.0–10.5)

## 2014-02-06 LAB — BASIC METABOLIC PANEL
BUN: 13 mg/dL (ref 6–23)
CALCIUM: 8.9 mg/dL (ref 8.4–10.5)
CHLORIDE: 102 meq/L (ref 96–112)
CO2: 30 meq/L (ref 19–32)
Creatinine, Ser: 0.65 mg/dL (ref 0.50–1.10)
GFR calc Af Amer: >90 mL/min (ref >90)
GFR calc non Af Amer: >90 mL/min (ref >90)
Glucose, Bld: 108 mg/dL — ABNORMAL HIGH (ref 70–99)
Potassium: 3.4 mEq/L — ABNORMAL LOW (ref 3.7–5.3)
SODIUM: 142 meq/L (ref 137–147)

## 2014-02-06 LAB — D-DIMER, QUANTITATIVE (NOT AT ARMC): D DIMER QUANT: 0.36 ug{FEU}/mL (ref 0.00–0.48)

## 2014-02-06 LAB — I-STAT TROPONIN, ED: TROPONIN I, POC: 0.01 ng/mL (ref 0.00–0.08)

## 2014-02-06 MED ORDER — AZITHROMYCIN 500 MG PO TABS
500.0000 mg | ORAL_TABLET | Freq: Every day | ORAL | Status: AC
  Administered 2014-02-06: 500 mg via ORAL
  Filled 2014-02-06: qty 1

## 2014-02-06 MED ORDER — HEPARIN SODIUM (PORCINE) 5000 UNIT/ML IJ SOLN
5000.0000 [IU] | Freq: Three times a day (TID) | INTRAMUSCULAR | Status: DC
  Administered 2014-02-06 – 2014-02-07 (×4): 5000 [IU] via SUBCUTANEOUS
  Filled 2014-02-06 (×7): qty 1

## 2014-02-06 MED ORDER — POTASSIUM CHLORIDE CRYS ER 20 MEQ PO TBCR
40.0000 meq | EXTENDED_RELEASE_TABLET | Freq: Once | ORAL | Status: AC
  Administered 2014-02-06: 40 meq via ORAL
  Filled 2014-02-06: qty 2

## 2014-02-06 MED ORDER — LISINOPRIL 20 MG PO TABS
20.0000 mg | ORAL_TABLET | Freq: Every day | ORAL | Status: DC
  Administered 2014-02-06 – 2014-02-07 (×2): 20 mg via ORAL
  Filled 2014-02-06 (×2): qty 1

## 2014-02-06 MED ORDER — ALBUTEROL SULFATE HFA 108 (90 BASE) MCG/ACT IN AERS: 2.0000 | INHALATION_SPRAY | Freq: Four times a day (QID) | RESPIRATORY_TRACT | Status: DC | PRN

## 2014-02-06 MED ORDER — AZITHROMYCIN 250 MG PO TABS
250.0000 mg | ORAL_TABLET | Freq: Every day | ORAL | Status: DC
  Administered 2014-02-07: 250 mg via ORAL
  Filled 2014-02-06: qty 1

## 2014-02-06 MED ORDER — GABAPENTIN 300 MG PO CAPS
300.0000 mg | ORAL_CAPSULE | Freq: Three times a day (TID) | ORAL | Status: DC | PRN
  Filled 2014-02-06: qty 1

## 2014-02-06 MED ORDER — ALBUTEROL SULFATE (2.5 MG/3ML) 0.083% IN NEBU: 2.5000 mg | INHALATION_SOLUTION | Freq: Four times a day (QID) | RESPIRATORY_TRACT | Status: DC | PRN

## 2014-02-06 MED ORDER — IPRATROPIUM-ALBUTEROL 0.5-2.5 (3) MG/3ML IN SOLN
3.0000 mL | Freq: Once | RESPIRATORY_TRACT | Status: AC
  Administered 2014-02-06: 3 mL via RESPIRATORY_TRACT
  Filled 2014-02-06: qty 3

## 2014-02-06 MED ORDER — ALBUTEROL SULFATE (2.5 MG/3ML) 0.083% IN NEBU
2.5000 mg | INHALATION_SOLUTION | Freq: Once | RESPIRATORY_TRACT | Status: AC
  Administered 2014-02-06: 2.5 mg via RESPIRATORY_TRACT
  Filled 2014-02-06: qty 3

## 2014-02-06 MED ORDER — PREDNISONE 50 MG PO TABS
50.0000 mg | ORAL_TABLET | Freq: Every day | ORAL | Status: DC
  Administered 2014-02-06 – 2014-02-07 (×2): 50 mg via ORAL
  Filled 2014-02-06 (×3): qty 1

## 2014-02-06 MED ORDER — METOPROLOL SUCCINATE ER 50 MG PO TB24
50.0000 mg | ORAL_TABLET | Freq: Every day | ORAL | Status: DC
  Administered 2014-02-06 – 2014-02-07 (×2): 50 mg via ORAL
  Filled 2014-02-06 (×2): qty 1

## 2014-02-06 NOTE — ED Notes (Signed)
Pt Oxygen Sat at 86 % while ambulating.     Pt walked over 50 feet.

## 2014-02-06 NOTE — H&P (Addendum)
[facility] History and Physical  [name] ZOX:[id number] DOB: 11/27/1955 DOA: 02/05/2014  Referring physician: EDP PCP: Francee GentileBarham, Elaine P, LCSW   Chief Complaint: SOB   HPI: [name] is a 58 y.o. female with pmh of Asthma, who presents to the ED with 4 day history of cough and SOB.  Cough is productive of yellow sputum.  Has been unchanged during that time.  Has tried inhaler and nebulizer twice a day with mild temporary improvement.  No fever, nasal congestion, vomiting, nor rhinorrhea.  Did have chills intermittently throughout the week.  Does have generalized body aches.  She is positive for multiple sick contacts, she reports that "everyone" at home has been sick with similar symptoms this past week, though no one seems to be as sick as she is.  O2 sat in ED on room air was 84%, corrected to mid 90s on 4L via [place].  Review of Systems: Systems reviewed.  As above, otherwise negative  Past Medical History  Diagnosis Date  . Hypertension   . Asthma   . Thyroid disease    Past Surgical History  Procedure Laterality Date  . Abdominal hysterectomy    . Appendectomy     Social History:  reports that she has been smoking Cigarettes.  She has been smoking about 0.50 packs per day. She does not have any smokeless tobacco history on file. She reports that she does not drink alcohol or use illicit drugs. 1/2 PPD since age 58, quit for 1 year but resumed smoking.  No Known Allergies  Family History  Problem Relation Age of Onset  . Kidney failure Mother   . Heart attack Father      Prior to Admission medications   Medication Sig Start Date End Date Taking? Authorizing Provider  albuterol (PROVENTIL HFA;VENTOLIN HFA) 108 (90 BASE) MCG/ACT inhaler Inhale 2 puffs into the lungs every 6 (six) hours as needed for wheezing or shortness of breath.   Yes Historical Provider, MD  albuterol (PROVENTIL) (5 MG/ML) 0.5% nebulizer solution Take 2.5 mg by nebulization every 6 (six)  hours as needed for wheezing or shortness of breath. For shortness of breath and wheezing   Yes Historical Provider, MD  gabapentin (NEURONTIN) 300 MG capsule Take 300 mg by mouth 3 (three) times daily as needed (restless leg syndrome). For restless leg syndrome   Yes Historical Provider, MD  lisinopril (PRINIVIL,ZESTRIL) 20 MG tablet Take 20 mg by mouth daily.   Yes Historical Provider, MD  metoprolol (TOPROL-XL) 50 MG 24 hr tablet Take 50 mg by mouth daily.     Yes Historical Provider, MD   Physical Exam: Filed Vitals:   02/06/14 0359  BP: 141/77  Pulse: 74  Temp:   Resp: 22    BP 141/77  Pulse 74  Temp(Src) 98.4 F (36.9 C) (Oral)  Resp 22  SpO2 97%  General Appearance:    Alert, oriented, no distress, appears stated age  Head:    Normocephalic, atraumatic  Eyes:    PERRL, EOMI, sclera non-icteric        Nose:   Nares without drainage or epistaxis. Mucosa, turbinates normal  Throat:   Moist mucous membranes. Oropharynx without erythema or exudate.  Neck:   Supple. No carotid bruits.  No thyromegaly.  No lymphadenopathy.   Back:     No CVA tenderness, no spinal tenderness  Lungs:     Clear to auscultation bilaterally, without wheezes, rhonchi or rales  Chest wall:    No tenderness  to palpitation  Heart:    Regular rate and rhythm without murmurs, gallops, rubs  Abdomen:     Soft, non-tender, nondistended, normal bowel sounds, no organomegaly  Genitalia:    deferred  Rectal:    deferred  Extremities:   No clubbing, cyanosis or edema.  Pulses:   2+ and symmetric all extremities  Skin:   Skin color, texture, turgor normal, no rashes or lesions  Lymph nodes:   Cervical, supraclavicular, and axillary nodes normal  Neurologic:   CNII-XII intact. Normal strength, sensation and reflexes      throughout    Labs on Admission:  Basic Metabolic Panel:  Recent Labs Lab 02/06/14 0127  NA 142  K 3.4*  CL 102  CO2 30  GLUCOSE 108*  BUN 13  CREATININE 0.65  CALCIUM 8.9    Liver Function Tests: No results found for this basename: AST, ALT, ALKPHOS, BILITOT, PROT, ALBUMIN,  in the last 168 hours No results found for this basename: LIPASE, AMYLASE,  in the last 168 hours No results found for this basename: AMMONIA,  in the last 168 hours CBC:  Recent Labs Lab 02/06/14 0127  WBC 10.9*  NEUTROABS 3.9  HGB 14.2  HCT 43.2  MCV 85.7  PLT 276   Cardiac Enzymes: No results found for this basename: CKTOTAL, CKMB, CKMBINDEX, TROPONINI,  in the last 168 hours  BNP (last 3 results) No results found for this basename: PROBNP,  in the last 8760 hours CBG: No results found for this basename: GLUCAP,  in the last 168 hours  Radiological Exams on Admission: Dg Chest 2 View (if Patient Has Fever And/or Copd)  02/05/2014   CLINICAL DATA:  Short of breath, cough, fever  EXAM: CHEST  2 VIEW  COMPARISON:  Prior chest x-ray 11/13/2011  FINDINGS: Stable cardiac and mediastinal contours. Trace atherosclerotic calcification in the transverse aorta. Mild prominence of the main pulmonary artery remains stable. No focal airspace consolidation. Stable appearance of chronic bronchitic changes and mild interstitial prominence. Stable appearance of a nodular opacity in the medial right lung apex over multiple prior studies dating back to 2012 likely reflects a prominence of the adjacent osseous structures. No acute fracture or bony abnormality.  IMPRESSION: Stable chest x-ray without evidence of acute cardiopulmonary process.   Electronically Signed   By: Malachy Moan M.D.   On: 02/05/2014 21:59    EKG: Independently reviewed.  Assessment/Plan Principal Problem:   Acute viral bronchitis Active Problems:   Hypoxia   1. Acute viral bronchitis - prednisone daily ordered, strong suspicion for a viral syndrome given the lack of PNA on CXR and the numerous people affected at home.  Given lack of SIRS criteria other than a borderline WBC of 10.9 (and even this shows a shift  twords lymphocytes on the differential), will hold off on Abx for now.  Adult wheeze protocol for breathing treatments (although patient lungs are actually clear on evaluation). 2. Hypoxia - continuous pulse ox and O2 via [place].  Due to lack of pulmonary findings, I wonder if the patient may have some pulmonary disease (ie COPD) at baseline that just hasnt been formally diagnosed yet, should likely follow up with PFTs as outpatient once her acute illness is resolved.    Code Status: Full Code  Family Communication: No family in room Disposition Plan: Admit to inpatient   Time spent: 91 min  Hillary Bow [facility] Pager [phone]  If 7AM-7PM, please contact the day team taking care of  the patient [email or web address] Password [email or web address] 02/06/2014, 4:15 AM

## 2014-02-06 NOTE — Progress Notes (Addendum)
PROGRESS NOTE    [name] WNU:[id number] DOB: 09-Nov-1955 DOA: 02/05/2014 PCP: Francee Gentile, LCSW  HPI/Brief narrative 58 y/o female patient with h/o asthma, ongoing tobacco abuse and HTN presented on 02/06/14 with low grade fevers, productive cough & worsening dyspnea and wheezing that was not relieved by home inhalers and nebulizer's. 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 O2 sat in ED on room air was 84%, corrected to mid 90s on 4L via .   Assessment/Plan:  1. Acute Asthmatic Bronchitis vs AECOPD: treated empirically with steroids, nebs and oxygen. Even though initially felt to be viral, she could have bacterial exacerbation and may have underlying COPD. Hence will start Zpak. Improving. Will need PFT's when acute phase has resolved. 2. Acute hypoxic respiratory failure: 2/2 # 1. Reevaluate re home O2 needs prior to DC. 3. Tobacco Abuse: cessation counseled. 4. Hypokalemia: replace and follow. 5. HTN: continue home meds. Controlled.   Code Status: Full Family Communication: None at bedside. Disposition Plan: Home possibly 5/30    Consultants:  None  Procedures:  None  Antibiotics:  ZPak 5/29>   Subjective: Feels better. Improving SOB, wheezing and cough. Able to ambulate in room.  Objective: Filed Vitals:   02/06/14 0359 02/06/14 0425 02/06/14 1347 02/06/14 1500  BP: 141/77 153/79 147/75   Pulse: 74 66 65   Temp:  97.8 F (36.6 C) 98.3 F (36.8 C)   TempSrc:  Oral Oral   Resp: 22 20 18    Height:  5\' 5"  (1.651 m)    Weight:  83.099 kg (183 lb 3.2 oz)    SpO2: 97% 96% 99% 93%    Intake/Output Summary (Last 24 hours) at 02/06/14 1821 Last data filed at 02/06/14 1447  Gross per 24 hour  Intake    480 ml  Output      0 ml  Net    480 ml   Filed Weights   02/06/14 0425  Weight: 83.099 kg (183 lb 3.2 oz)     Exam:   General exam: Middle aged female sitting up comfortably in bed. Respiratory system:  occasional posterior rhonchi otherwise clear to auscultation. No increased work of breathing. Cardiovascular system: S1 & S2 heard, RRR. No JVD, murmurs, gallops, clicks or pedal edema. Tele: SR. Gastrointestinal system: Abdomen is nondistended, soft and nontender. Normal bowel sounds heard. Central nervous system: Alert and oriented. No focal neurological deficits. Extremities: Symmetric 5 x 5 power.   Data Reviewed: Basic Metabolic Panel:  Recent Labs Lab 02/06/14 0127  NA 142  K 3.4*  CL 102  CO2 30  GLUCOSE 108*  BUN 13  CREATININE 0.65  CALCIUM 8.9   Liver Function Tests: No results found for this basename: AST, ALT, ALKPHOS, BILITOT, PROT, ALBUMIN,  in the last 168 hours No results found for this basename: LIPASE, AMYLASE,  in the last 168 hours No results found for this basename: AMMONIA,  in the last 168 hours CBC:  Recent Labs Lab 02/06/14 0127  WBC 10.9*  NEUTROABS 3.9  HGB 14.2  HCT 43.2  MCV 85.7  PLT 276   Cardiac Enzymes: No results found for this basename: CKTOTAL, CKMB, CKMBINDEX, TROPONINI,  in the last 168 hours BNP (last 3 results) No results found for this basename: PROBNP,  in the last 8760 hours CBG: No results found for this basename: GLUCAP,  in the last 168 hours  No results found for this or any previous visit (from the past  240 hour(s)).    Studies: Dg Chest 2 View (if Patient Has Fever And/or Copd)  02/05/2014   CLINICAL DATA:  Short of breath, cough, fever  EXAM: CHEST  2 VIEW  COMPARISON:  Prior chest x-ray 11/13/2011  FINDINGS: Stable cardiac and mediastinal contours. Trace atherosclerotic calcification in the transverse aorta. Mild prominence of the main pulmonary artery remains stable. No focal airspace consolidation. Stable appearance of chronic bronchitic changes and mild interstitial prominence. Stable appearance of a nodular opacity in the medial right lung apex over multiple prior studies dating back to 2012 likely reflects a  prominence of the adjacent osseous structures. No acute fracture or bony abnormality.  IMPRESSION: Stable chest x-ray without evidence of acute cardiopulmonary process.   Electronically Signed   By: Malachy MoanHeath  McCullough M.D.   On: 02/05/2014 21:59        Scheduled Meds: . heparin  5,000 Units Subcutaneous 3 times per day  . lisinopril  20 mg Oral Daily  . metoprolol succinate  50 mg Oral Daily  . predniSONE  50 mg Oral Q breakfast   Continuous Infusions:   Principal Problem:   Acute viral bronchitis Active Problems:   Hypoxia    Time spent: 20 minutes.    Elease EtienneAnand D Severo Beber, MD, FACP, [facility]. [facility] Pager [phone]  If 7PM-7AM, please contact night-coverage [email or web address] Password [email or web address] 02/06/2014, 6:21 PM    LOS: 1 day

## 2014-02-06 NOTE — Care Management Note (Unsigned)
    Page 1 of 1   02/06/2014     1:53:24 PM CARE MANAGEMENT NOTE 02/06/2014  Patient:  [name], [name]   Account Number:  [id number]  Date Initiated:  02/06/2014  Documentation initiated by:  [facility]  Subjective/Objective Assessment:   58 year old female admitted with SOB and hypoxia.     Action/Plan:   From home.   Anticipated DC Date:  02/09/2014   Anticipated DC Plan:  HOME/SELF CARE      DC Planning Services  CM consult      Choice offered to / List presented to:             Status of service:  Completed, signed off Medicare Important Message given?   (If response is "NO", the following Medicare IM given date fields will be blank) Date Medicare IM given:   Date Additional Medicare IM given:    Discharge Disposition:    Per UR Regulation:  Reviewed for med. necessity/level of care/duration of stay  If discussed at Long Length of Stay Meetings, dates discussed:    Comments:

## 2014-02-06 NOTE — ED Provider Notes (Signed)
Medical screening examination/treatment/procedure(s) were performed by non-physician practitioner and as supervising physician I was immediately available for consultation/collaboration.   EKG Interpretation None        Loren Racer, MD 02/06/14 [phone]

## 2014-02-06 NOTE — ED Notes (Signed)
O2 increased to 4lpm via [place]

## 2014-02-06 NOTE — ED Notes (Signed)
Placed O2 sat checked on Room Air; pt placed on 2lpm via [place]

## 2014-02-07 DIAGNOSIS — I10 Essential (primary) hypertension: Secondary | ICD-10-CM

## 2014-02-07 DIAGNOSIS — E876 Hypokalemia: Secondary | ICD-10-CM

## 2014-02-07 LAB — BASIC METABOLIC PANEL
BUN: 17 mg/dL (ref 6–23)
CALCIUM: 9.1 mg/dL (ref 8.4–10.5)
CO2: 27 mEq/L (ref 19–32)
Chloride: 100 mEq/L (ref 96–112)
Creatinine, Ser: 0.69 mg/dL (ref 0.50–1.10)
GFR calc non Af Amer: >90 mL/min (ref >90)
Glucose, Bld: 95 mg/dL (ref 70–99)
Potassium: 4 mEq/L (ref 3.7–5.3)
Sodium: 138 mEq/L (ref 137–147)

## 2014-02-07 MED ORDER — AZITHROMYCIN 250 MG PO TABS: 250.0000 mg | ORAL_TABLET | Freq: Every day | ORAL | Status: DC

## 2014-02-07 MED ORDER — PREDNISONE 10 MG PO TABS: ORAL_TABLET | ORAL | Status: DC

## 2014-02-07 NOTE — Progress Notes (Signed)
SATURATION QUALIFICATIONS: (This note is used to comply with regulatory documentation for home oxygen)  Patient Saturations on Room Air at Rest = 93%  Patient Saturations on Room Air while Ambulating = 91%  Patient Saturations on 0 Liters of oxygen while Ambulating = 91%  Please briefly explain why patient needs home oxygen: 

## 2014-02-07 NOTE — Discharge Summary (Signed)
Physician Discharge Summary  Kim OchsVivian Perez [id number] DOB: 07-20-1956 DOA: 02/05/2014  PCP: Leanor RubensteinSUN,VYVYAN Y, MD  Admit date: 02/05/2014 Discharge date: 02/07/2014  Time spent: Less than 30 minutes  Recommendations for Outpatient Follow-up:  1. Dr. Deatra JamesVyvyan Sun, PCP in 1 week.  Discharge Diagnoses:  Principal Problem:   Acute viral bronchitis Active Problems:   Hypoxia   Discharge Condition: Improved & Stable  Diet recommendation: Heart healthy diet.  Filed Weights   02/06/14 0425  Weight: 83.099 kg (183 lb 3.2 oz)    History of present illness:  58 y/o female patient with h/o asthma, ongoing tobacco abuse and HTN presented on 02/06/14 with low grade fevers, productive cough & worsening dyspnea and wheezing that was not relieved by home inhalers and nebulizer's. 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 O2 sat in ED on room air was 84%, corrected to mid 90s on 4L via [place].  Hospital Course:  1. Acute Asthmatic Bronchitis vs AECOPD: treated empirically with steroids, nebs and oxygen. Even though initially felt to be viral, she could have bacterial exacerbation and may have underlying COPD. Hence started Zpak. Improved. Will need PFT's when acute phase has resolved. 2. Acute hypoxic respiratory failure: 2/2 # 1. Hypoxia resolved. 3. Tobacco Abuse: cessation counseled. 4. Hypokalemia: replaced. 5. HTN: continue home meds. Controlled.  Consultations:  None  Procedures:  None    Discharge Exam:  Complaints:  Patient denies complaints and is anxious to go home. Denies cough, dyspnea, wheezing or chest pain.  Filed Vitals:   02/06/14 1500 02/06/14 2034 02/07/14 0417 02/07/14 0805  BP:  134/72 161/82   Pulse:  74 67   Temp:  97.9 F (36.6 C) 98.2 F (36.8 C)   TempSrc:  Oral Oral   Resp:  17 18   Height:      Weight:      SpO2: 93% 93% 93% 93%    General exam: Middle aged female sitting up comfortably in chair.  Respiratory  system: clear to auscultation. No increased work of breathing.  Cardiovascular system: S1 & S2 heard, RRR. No JVD, murmurs, gallops, clicks or pedal edema.  Gastrointestinal system: Abdomen is nondistended, soft and nontender. Normal bowel sounds heard.  Central nervous system: Alert and oriented. No focal neurological deficits.  Extremities: Symmetric 5 x 5 power.   Discharge Instructions      Discharge Instructions   Activity as tolerated - No restrictions    Complete by:  As directed      Call MD for:  difficulty breathing, headache or visual disturbances    Complete by:  As directed      Diet - low sodium heart healthy    Complete by:  As directed             Medication List         albuterol (5 MG/ML) 0.5% nebulizer solution  Commonly known as:  PROVENTIL  Take 2.5 mg by nebulization every 6 (six) hours as needed for wheezing or shortness of breath. For shortness of breath and wheezing     albuterol 108 (90 BASE) MCG/ACT inhaler  Commonly known as:  PROVENTIL HFA;VENTOLIN HFA  Inhale 2 puffs into the lungs every 6 (six) hours as needed for wheezing or shortness of breath.     azithromycin 250 MG tablet  Commonly known as:  ZITHROMAX  Take 1 tablet (250 mg total) by mouth daily.  Start taking on:  02/08/2014  gabapentin 300 MG capsule  Commonly known as:  NEURONTIN  Take 300 mg by mouth 3 (three) times daily as needed (restless leg syndrome). For restless leg syndrome     lisinopril 20 MG tablet  Commonly known as:  PRINIVIL,ZESTRIL  Take 20 mg by mouth daily.     metoprolol succinate 50 MG 24 hr tablet  Commonly known as:  TOPROL-XL  Take 50 mg by mouth daily.     predniSONE 10 MG tablet  Commonly known as:  DELTASONE  Take 4 tablets on 02/08/14 and then taper down by one tablet daily to discontinue.  Start taking on:  02/08/2014       Follow-up Information   Follow up with Leanor Rubenstein, MD. Schedule an appointment as soon as possible for a visit in 1  week.   Specialty:  Family Medicine   Contact information:   [address], [address] [place] [address] [phone]        The results of significant diagnostics from this hospitalization (including imaging, microbiology, ancillary and laboratory) are listed below for reference.    Significant Diagnostic Studies: Dg Chest 2 View (if Patient Has Fever And/or Copd)  02/05/2014   CLINICAL DATA:  Short of breath, cough, fever  EXAM: CHEST  2 VIEW  COMPARISON:  Prior chest x-ray 11/13/2011  FINDINGS: Stable cardiac and mediastinal contours. Trace atherosclerotic calcification in the transverse aorta. Mild prominence of the main pulmonary artery remains stable. No focal airspace consolidation. Stable appearance of chronic bronchitic changes and mild interstitial prominence. Stable appearance of a nodular opacity in the medial right lung apex over multiple prior studies dating back to 2012 likely reflects a prominence of the adjacent osseous structures. No acute fracture or bony abnormality.  IMPRESSION: Stable chest x-ray without evidence of acute cardiopulmonary process.   Electronically Signed   By: Malachy Moan M.D.   On: 02/05/2014 21:59    Microbiology: No results found for this or any previous visit (from the past 240 hour(s)).   Labs: Basic Metabolic Panel:  Recent Labs Lab 02/06/14 0127 02/07/14 0516  NA 142 138  K 3.4* 4.0  CL 102 100  CO2 30 27  GLUCOSE 108* 95  BUN 13 17  CREATININE 0.65 0.69  CALCIUM 8.9 9.1   Liver Function Tests: No results found for this basename: AST, ALT, ALKPHOS, BILITOT, PROT, ALBUMIN,  in the last 168 hours No results found for this basename: LIPASE, AMYLASE,  in the last 168 hours No results found for this basename: AMMONIA,  in the last 168 hours CBC:  Recent Labs Lab 02/06/14 0127  WBC 10.9*  NEUTROABS 3.9  HGB 14.2  HCT 43.2  MCV 85.7  PLT 276   Cardiac Enzymes: No results found for this basename: CKTOTAL, CKMB,  CKMBINDEX, TROPONINI,  in the last 168 hours BNP: BNP (last 3 results) No results found for this basename: PROBNP,  in the last 8760 hours CBG: No results found for this basename: GLUCAP,  in the last 168 hours    Signed:  Elease Etienne, MD, FACP, [facility]. [facility] Pager [phone]  If 7PM-7AM, please contact night-coverage [email or web address] Password [facility] 02/07/2014, 12:25 PM

## 2014-03-09 ENCOUNTER — Encounter (HOSPITAL_COMMUNITY): Payer: Self-pay | Admitting: Emergency Medicine

## 2014-03-09 ENCOUNTER — Emergency Department (HOSPITAL_COMMUNITY)
Admission: EM | Admit: 2014-03-09 | Discharge: 2014-03-09 | Disposition: A | Discharge status: Home / Self Care | Payer: No Typology Code available for payment source | Attending: Emergency Medicine | Admitting: Emergency Medicine

## 2014-03-09 DIAGNOSIS — I10 Essential (primary) hypertension: Secondary | ICD-10-CM | POA: Insufficient documentation

## 2014-03-09 DIAGNOSIS — Z8639 Personal history of other endocrine, nutritional and metabolic disease: Secondary | ICD-10-CM | POA: Insufficient documentation

## 2014-03-09 DIAGNOSIS — R001 Bradycardia, unspecified: Secondary | ICD-10-CM

## 2014-03-09 DIAGNOSIS — Z862 Personal history of diseases of the blood and blood-forming organs and certain disorders involving the immune mechanism: Secondary | ICD-10-CM | POA: Insufficient documentation

## 2014-03-09 DIAGNOSIS — J45909 Unspecified asthma, uncomplicated: Secondary | ICD-10-CM | POA: Insufficient documentation

## 2014-03-09 DIAGNOSIS — F17200 Nicotine dependence, unspecified, uncomplicated: Secondary | ICD-10-CM | POA: Insufficient documentation

## 2014-03-09 DIAGNOSIS — R42 Dizziness and giddiness: Secondary | ICD-10-CM | POA: Insufficient documentation

## 2014-03-09 DIAGNOSIS — I498 Other specified cardiac arrhythmias: Secondary | ICD-10-CM | POA: Insufficient documentation

## 2014-03-09 LAB — CBC
HEMATOCRIT: 45.6 % (ref 36.0–46.0)
HEMOGLOBIN: 15 g/dL (ref 12.0–15.0)
MCH: 28.4 pg (ref 26.0–34.0)
MCHC: 32.9 g/dL (ref 30.0–36.0)
MCV: 86.4 fL (ref 78.0–100.0)
PLATELETS: 331 10*3/uL (ref 150–400)
RBC: 5.28 MIL/uL — AB (ref 3.87–5.11)
RDW: 13.3 % (ref 11.5–15.5)
WBC: 9.4 10*3/uL (ref 4.0–10.5)

## 2014-03-09 LAB — BASIC METABOLIC PANEL
BUN: 13 mg/dL (ref 6–23)
CO2: 26 meq/L (ref 19–32)
CREATININE: 0.79 mg/dL (ref 0.50–1.10)
Calcium: 9.2 mg/dL (ref 8.4–10.5)
Chloride: 102 mEq/L (ref 96–112)
GFR calc Af Amer: >90 mL/min (ref >90)
GFR calc non Af Amer: >90 mL/min (ref >90)
GLUCOSE: 84 mg/dL (ref 70–99)
Potassium: 5 mEq/L (ref 3.7–5.3)
Sodium: 138 mEq/L (ref 137–147)

## 2014-03-09 LAB — URINALYSIS, ROUTINE W REFLEX MICROSCOPIC
Bilirubin Urine: NEGATIVE
GLUCOSE, UA: NEGATIVE mg/dL
HGB URINE DIPSTICK: NEGATIVE
KETONES UR: NEGATIVE mg/dL
Leukocytes, UA: NEGATIVE
Nitrite: NEGATIVE
PH: 6.5 (ref 5.0–8.0)
PROTEIN: NEGATIVE mg/dL
Specific Gravity, Urine: 1.022 (ref 1.005–1.030)
Urobilinogen, UA: 0.2 mg/dL (ref 0.0–1.0)

## 2014-03-09 NOTE — Discharge Instructions (Signed)
Please follow up with your doctor for further care because your heart rate is lower than normal which may cause your symptoms.  Return for further care if your symptoms worsen  Dizziness  Dizziness means you feel unsteady or lightheaded. You might feel like you are going to pass out (faint). HOME CARE   Drink enough fluids to keep your pee (urine) clear or pale yellow.  Take your medicines exactly as told by your doctor. If you take blood pressure medicine, always stand up slowly from the lying or sitting position. Hold on to something to steady yourself.  If you need to stand in one place for a long time, move your legs often. Tighten and relax your leg muscles.  Have someone stay with you until you feel okay.  Do not drive or use heavy machinery if you feel dizzy.  Do not drink alcohol. GET HELP RIGHT AWAY IF:   You feel dizzy or lightheaded and it gets worse.  You feel sick to your stomach (nauseous), or you throw up (vomit).  You have trouble talking or walking.  You feel weak or have trouble using your arms, hands, or legs.  You cannot think clearly or have trouble forming sentences.  You have chest pain, belly (abdominal) pain, sweating, or you are short of breath.  Your vision changes.  You are bleeding.  You have problems from your medicine that seem to be getting worse. MAKE SURE YOU:   Understand these instructions.  Will watch your condition.  Will get help right away if you are not doing well or get worse. Document Released: 08/17/2011 Document Revised: 11/20/2011 Document Reviewed: 08/17/2011 [facility] Patient Information 2015 [place], [place]. This information is not intended to replace advice given to you by your health care provider. Make sure you discuss any questions you have with your health care provider.  Bradycardia Bradycardia is a term for a heart rate (pulse) that, in adults, is slower than 60 beats per minute. A normal rate is 60 to 100 beats per  minute. A heart rate below 60 beats per minute may be normal for some adults with healthy hearts. If the rate is too slow, the heart may have trouble pumping the volume of blood the body needs. If the heart rate gets too low, blood flow to the brain may be decreased and may make you feel lightheaded, dizzy, or faint. The heart has a natural pacemaker in the top of the heart called the SA node (sinoatrial or sinus node). This pacemaker sends out regular electrical signals to the muscle of the heart, telling the heart muscle when to beat (contract). The electrical signal travels from the upper parts of the heart (atria) through the AV node (atrioventricular node), to the lower chambers of the heart (ventricles). The ventricles squeeze, pumping the blood from your heart to your lungs and to the rest of your body. CAUSES   Problem with the heart's electrical system.  Problem with the heart's natural pacemaker.  Heart disease, damage, or infection.  Medications.  Problems with minerals and salts (electrolytes). SYMPTOMS   Fainting (syncope).  Fatigue and weakness.  Shortness of breath (dyspnea).  Chest pain (angina).  Drowsiness.  Confusion. DIAGNOSIS   An electrocardiogram (ECG) can help your caregiver determine the type of slow heart rate you have.  If the cause is not seen on an ECG, you may need to wear a heart monitor that records your heart rhythm for several hours or days.  Blood tests. TREATMENT  Electrolyte supplements.  Medications.  Withholding medication which is causing a slow heart rate.  Pacemaker placement. SEEK IMMEDIATE MEDICAL CARE IF:   You feel lightheaded or faint.  You develop an irregular heart rate.  You feel chest pain or have trouble breathing. MAKE SURE YOU:   Understand these instructions.  Will watch your condition.  Will get help right away if you are not doing well or get worse. Document Released: 05/20/2002 Document Revised:  11/20/2011 Document Reviewed: 04/15/2008 [facility] Patient Information 2015 [place] Springs[place]Care, [place]. This information is not intended to replace advice given to you by your health care provider. Make sure you discuss any questions you have with your health care provider.

## 2014-03-09 NOTE — ED Notes (Signed)
Dr Harrison at bedside

## 2014-03-09 NOTE — ED Provider Notes (Signed)
CSN: [id number]     Arrival date & time 03/09/14  1804 History   First MD Initiated Contact with Patient 03/09/14 2114     Chief Complaint  Patient presents with  . Weakness     (Consider location/radiation/quality/duration/timing/severity/associated sxs/prior Treatment) Patient is a 58 y.o. female presenting with dizziness. The history is provided by the patient. No language interpreter was used.  Dizziness Quality:  Imbalance Severity:  Moderate Onset quality:  Sudden Duration:  2 days Timing:  Sporadic Progression:  Partially resolved Chronicity:  Recurrent Context: standing up   Context: not with loss of consciousness   Context comment:  Sitting Relieved by: sitting down. Ineffective treatments:  None tried Associated symptoms: no chest pain, no diarrhea, no headaches, no palpitations, no tinnitus and no weakness   Risk factors: no new medications       Past Medical History  Diagnosis Date  . Hypertension   . Asthma   . Thyroid disease    Past Surgical History  Procedure Laterality Date  . Abdominal hysterectomy    . Appendectomy     Family History  Problem Relation Age of Onset  . Kidney failure Mother   . Heart attack Father    History  Substance Use Topics  . Smoking status: Current Every Day Smoker -- 0.50 packs/day    Types: Cigarettes  . Smokeless tobacco: Not on file  . Alcohol Use: No   OB History   Grav Para Term Preterm Abortions TAB SAB Ect Mult Living                 Review of Systems  HENT: Negative for tinnitus.   Cardiovascular: Negative for chest pain and palpitations.  Gastrointestinal: Negative for diarrhea.  Neurological: Positive for dizziness. Negative for headaches.  All other systems reviewed and are negative.     Allergies  Review of patient's allergies indicates no known allergies.  Home Medications   Prior to Admission medications   Medication Sig Start Date End Date Taking? Authorizing Zayvon Alicea  albuterol  (PROVENTIL HFA;VENTOLIN HFA) 108 (90 BASE) MCG/ACT inhaler Inhale 2 puffs into the lungs every 6 (six) hours as needed for wheezing or shortness of breath.   Yes Historical Randilyn Foisy, MD  albuterol (PROVENTIL) (5 MG/ML) 0.5% nebulizer solution Take 2.5 mg by nebulization every 6 (six) hours as needed for wheezing or shortness of breath.    Yes Historical Victoriah Wilds, MD  gabapentin (NEURONTIN) 300 MG capsule Take 300 mg by mouth 3 (three) times daily as needed (restless leg syndrome).    Yes Historical Brevon Dewald, MD  lisinopril (PRINIVIL,ZESTRIL) 20 MG tablet Take 20 mg by mouth every morning.    Yes Historical Chivon Lepage, MD  metoprolol (TOPROL-XL) 50 MG 24 hr tablet Take 50 mg by mouth every morning.    Yes Historical Mayur Duman, MD   BP 163/103  Pulse 57  Temp(Src) 98 F (36.7 C) (Oral)  Resp 18  SpO2 99% Physical Exam  Nursing note and vitals reviewed. Constitutional: She appears well-developed and well-nourished. No distress.  HENT:  Head: Atraumatic.  Right Ear: External ear normal.  Left Ear: External ear normal.  Eyes: Conjunctivae and EOM are normal. Pupils are equal, round, and reactive to light.  Strabismus of the R eye  Neck: Neck supple.  Cardiovascular: Normal rate, regular rhythm and intact distal pulses.   Pulmonary/Chest: Effort normal and breath sounds normal.  Abdominal: Soft. There is no tenderness.  Neurological: She is alert.  Neurologic exam:  Speech clear, pupils equal  round reactive to light, extraocular movements intact  Normal peripheral visual fields Cranial nerves III through XII normal including no facial droop Follows commands, moves all extremities x4, normal strength to bilateral upper and lower extremities at all major muscle groups including grip Sensation normal to light touch  Coordination intact, no limb ataxia, finger-nose-finger normal Rapid alternating movements normal No pronator drift Gait normal   Skin: No rash noted.  Psychiatric: She has a  normal mood and affect.    ED Course  Procedures (including critical care time)  9:35 PM Patient presents with several episodes of dizziness that is lasting only for a few seconds. She is currently symptom free. She has no focal neuro deficit on exam. No significant headache concerning for stroke. Ear exam is unremarkable. Her labs has been reassuring.  10:32 PM Pt is bradycardic.  She is currently on a beta blocker.  This could potentially cause bradycardia.  Pt does have a PCP and agrees to f/u closely.  Care discussed with Dr. Romeo AppleHarrison who felt pt stable for discharge with close f/u with PCP.    Labs Review Labs Reviewed  CBC - Abnormal; Notable for the following:    RBC 5.28 (*)    All other components within normal limits  BASIC METABOLIC PANEL  URINALYSIS, ROUTINE W REFLEX MICROSCOPIC    Imaging Review No results found.   EKG Interpretation None      Date: 03/09/2014  Rate: 49  Rhythm: sinus bradycardia  QRS Axis: normal  Intervals: normal  ST/T Wave abnormalities: normal  Conduction Disutrbances:none  Narrative Interpretation:   Old EKG Reviewed: none available    MDM   Final diagnoses:  Dizziness  Bradycardia    BP 175/100  Pulse 60  Temp(Src) 97.8 F (36.6 C) (Oral)  Resp 18  SpO2 99%  I have reviewed nursing notes and vital signs. I reviewed available ER/hospitalization records thought the EMR     Fayrene HelperBowie Tran, [place] JerseyPA-C 03/09/14 2236

## 2014-03-09 NOTE — ED Notes (Signed)
Pt states yesterday she became sick and church and has been feeling weak every since. Denies n/v, c/o dizziness and weakness.

## 2014-03-10 NOTE — ED Provider Notes (Signed)
Medical screening examination/treatment/procedure(s) were conducted as a shared visit with non-physician practitioner(s) and myself.  I personally evaluated the patient during the encounter.   EKG Interpretation   Date/Time:  Monday March 09 2014 21:30:47 EDT Ventricular Rate:  49 PR Interval:  140 QRS Duration: 87 QT Interval:  469 QTC Calculation: 423 R Axis:   88 Text Interpretation:  Sinus bradycardia No significant change since last  tracing Confirmed by HARRISON  MD, FORREST ([id number]) on 03/09/2014 9:43:43 PM      I interviewed and examined the patient. Lungs are CTAB. Cardiac exam wnl. Abdomen soft. Pt's sx intermittent and currently asx. She feels malaise and this could be the beginning of a viral syndrome. Do not think this is a CVA. Will rec close f/u w/ pcp and return for worsening.   Junius ArgyleForrest S Harrison, MD 03/10/14 1021

## 2014-06-01 ENCOUNTER — Encounter (HOSPITAL_COMMUNITY): Payer: Self-pay | Admitting: Emergency Medicine

## 2014-06-01 ENCOUNTER — Emergency Department (HOSPITAL_COMMUNITY)
Admission: EM | Admit: 2014-06-01 | Discharge: 2014-06-01 | Disposition: A | Discharge status: Home / Self Care | Payer: No Typology Code available for payment source | Attending: Emergency Medicine | Admitting: Emergency Medicine

## 2014-06-01 DIAGNOSIS — I10 Essential (primary) hypertension: Secondary | ICD-10-CM | POA: Diagnosis not present

## 2014-06-01 DIAGNOSIS — Z79899 Other long term (current) drug therapy: Secondary | ICD-10-CM | POA: Insufficient documentation

## 2014-06-01 DIAGNOSIS — B351 Tinea unguium: Secondary | ICD-10-CM | POA: Diagnosis not present

## 2014-06-01 DIAGNOSIS — M79609 Pain in unspecified limb: Secondary | ICD-10-CM | POA: Diagnosis present

## 2014-06-01 DIAGNOSIS — E119 Type 2 diabetes mellitus without complications: Secondary | ICD-10-CM | POA: Insufficient documentation

## 2014-06-01 DIAGNOSIS — F17200 Nicotine dependence, unspecified, uncomplicated: Secondary | ICD-10-CM | POA: Diagnosis not present

## 2014-06-01 NOTE — ED Notes (Signed)
Pt leaving with ALL belongings she arrived with. Ambulatory upon dc

## 2014-06-01 NOTE — ED Provider Notes (Signed)
CSN: [id number]     Arrival date & time 06/01/14  2225 History  This chart was scribed for Arman Filter, NP, working with Raeford Razor, MD found by Elon Spanner, ED Scribe. This patient was seen in room WTR6/WTR6 and the patient's care was started at 10:50 PM.   Chief Complaint  Patient presents with  . Nail Problem   The history is provided by the patient. No language interpreter was used.    HPI Comments: [name] is a 58 y.o. female who presents to the Emergency Department complaining of a gradually worsening right big toe pain with associated pus drainage.  Patient reports that she initially thought that she had an ingrown toe nail but now believes she has a fungal infection.  She reports she was unable to see her PCP, Dr. Wynelle Link, for this complaint.  Patient denies history of DM.  Patient denies injury.  Past Medical History  Diagnosis Date  . Hypertension   . Asthma   . Thyroid disease    Past Surgical History  Procedure Laterality Date  . Abdominal hysterectomy    . Appendectomy     Family History  Problem Relation Age of Onset  . Kidney failure Mother   . Heart attack Father    History  Substance Use Topics  . Smoking status: Current Every Day Smoker -- 0.50 packs/day    Types: Cigarettes  . Smokeless tobacco: Not on file  . Alcohol Use: No   OB History   Grav Para Term Preterm Abortions TAB SAB Ect Mult Living                 Review of Systems  Musculoskeletal: Negative for joint swelling.  Skin: Negative for rash and wound.  All other systems reviewed and are negative.     Allergies  Review of patient's allergies indicates no known allergies.  Home Medications   Prior to Admission medications   Medication Sig Start Date End Date Taking? Authorizing Provider  albuterol (PROVENTIL HFA;VENTOLIN HFA) 108 (90 BASE) MCG/ACT inhaler Inhale 2 puffs into the lungs every 6 (six) hours as needed for wheezing or shortness of breath.   Yes Historical Provider,  MD  albuterol (PROVENTIL) (5 MG/ML) 0.5% nebulizer solution Take 2.5 mg by nebulization every 6 (six) hours as needed for wheezing or shortness of breath.    Yes Historical Provider, MD  gabapentin (NEURONTIN) 300 MG capsule Take 300 mg by mouth 3 (three) times daily as needed (restless leg syndrome).    Yes Historical Provider, MD  lisinopril (PRINIVIL,ZESTRIL) 20 MG tablet Take 20 mg by mouth every morning.    Yes Historical Provider, MD  metoprolol (TOPROL-XL) 50 MG 24 hr tablet Take 50 mg by mouth every morning.    Yes Historical Provider, MD  Multiple Vitamin (MULTIVITAMIN WITH MINERALS) TABS tablet Take 1 tablet by mouth daily.   Yes Historical Provider, MD   BP 127/79  Pulse 62  Temp(Src) 98.1 F (36.7 C) (Oral)  Resp 16  Wt 190 lb (86.183 kg)  SpO2 94% Physical Exam  Nursing note and vitals reviewed. Constitutional: She is oriented to person, place, and time. She appears well-developed and well-nourished. No distress.  HENT:  Head: Normocephalic and atraumatic.  Eyes: Conjunctivae and EOM are normal.  Neck: Neck supple.  Cardiovascular: Normal rate.   Pulmonary/Chest: Effort normal.  Musculoskeletal: Normal range of motion. She exhibits tenderness.  Fungus under great toe nail without surrounding erythremia or sign of infection   Neurological:  She is alert and oriented to person, place, and time.  Skin: Skin is warm and dry.  Psychiatric: She has a normal mood and affect. Her behavior is normal.    ED Course  Procedures (including critical care time)  DIAGNOSTIC STUDIES: Oxygen Saturation is 94% on RA, adequate by my interpretation.    COORDINATION OF CARE:  10:52 PM Will refer patient to podiatrist.  Will prescribe soaking solution.  Patient acknowledges and agrees with plan.     Labs Review Labs Reviewed - No data to display  Imaging Review No results found.   EKG Interpretation None      MDM  Patient to soak toe in Domeboro solution and FU with  Podaitrist  Final diagnoses:  Onychomycosis of left great toe      I personally performed the services described in this documentation, which was scribed in my presence. The recorded information has been reviewed and is accurate.  Arman Filter, NP 06/01/14 2318

## 2014-06-01 NOTE — ED Notes (Signed)
Pt from home c/o rgiht great toe is painful with pus  Present. She reports that it is painful.

## 2014-06-01 NOTE — ED Provider Notes (Signed)
Medical screening examination/treatment/procedure(s) were performed by non-physician practitioner and as supervising physician I was immediately available for consultation/collaboration.   EKG Interpretation None       Ena Demary, MD 06/01/14 2353 

## 2014-06-01 NOTE — Discharge Instructions (Signed)
Please ask the Pharmacist for a packet of Domeboro solution to soak you toe in 2-4 times daily make an appointment with the podiatrist for further care

## 2015-06-22 ENCOUNTER — Ambulatory Visit (INDEPENDENT_AMBULATORY_CARE_PROVIDER_SITE_OTHER): Payer: No Typology Code available for payment source | Admitting: Neurology

## 2015-06-22 ENCOUNTER — Encounter: Payer: Self-pay | Admitting: Neurology

## 2015-06-22 VITALS — BP 128/87 | HR 64 | Ht 65.0 in | Wt 197.0 lb

## 2015-06-22 DIAGNOSIS — G2581 Restless legs syndrome: Secondary | ICD-10-CM | POA: Diagnosis not present

## 2015-06-22 DIAGNOSIS — G4762 Sleep related leg cramps: Secondary | ICD-10-CM | POA: Diagnosis not present

## 2015-06-22 HISTORY — DX: Sleep related leg cramps: G47.62

## 2015-06-22 HISTORY — DX: Restless legs syndrome: G25.81

## 2015-06-22 MED ORDER — BACLOFEN 10 MG PO TABS: 10.0000 mg | ORAL_TABLET | Freq: Every day | ORAL | Status: DC

## 2015-06-22 NOTE — Progress Notes (Signed)
Reason for visit: Leg cramps  Referring physician: Dr. Lawernce Ion is a 59 y.o. female  History of present illness:  [name] is a 59 year old right-handed black female with a history of what she terms restless leg syndrome dating back about 8 years. The patient lived in [place] previously, she indicated that she was treated with quinine tablets which were very effective. She has not been able to get quinine tablets recently, and she has been placed on gabapentin taking 600 mg at night with incomplete benefit. She describes her symptoms as beginning with some tingly sensations in the feet and legs, then she will go into a cramp in either the thigh or lower leg or foot. The cramp is quite severe, usually affecting both legs at the same time to the point where she is not able to walk effectively. The episodes were occurring almost every other night, but on the gabapentin she is having about 2 episodes a week. The next day after a severe cramp she may have soreness of the muscles. She generally does not have issues with the cramps during the day, only at nighttime. She often times wake up out of sleep with the episodes. The patient denies balance issues, weakness of the extremities, or numbness of extremities during the daytime. The patient denies any issues controlling the bowels or the bladder. She denies neck or low back pain. She indicates that her mother, several nieces and several maternal aunts also have similar issues. She is sent to this office for an evaluation.  Past Medical History  Diagnosis Date  . Hypertension   . Asthma   . Thyroid disease   . RLS (restless legs syndrome) 06/22/2015  . Nocturnal leg cramps 06/22/2015    Past Surgical History  Procedure Laterality Date  . Abdominal hysterectomy    . Appendectomy      Family History  Problem Relation Age of Onset  . Kidney failure Mother   . Heart failure Mother   . Heart attack Father   . Heart  attack Brother     Social history:  reports that she has been smoking Cigarettes.  She has been smoking about 0.25 packs per day. She has never used smokeless tobacco. She reports that she does not drink alcohol or use illicit drugs.  Medications:  Prior to Admission medications   Medication Sig Start Date End Date Taking? Authorizing Provider  albuterol (PROVENTIL HFA;VENTOLIN HFA) 108 (90 BASE) MCG/ACT inhaler Inhale 2 puffs into the lungs every 6 (six) hours as needed for wheezing or shortness of breath.   Yes Historical Provider, MD  albuterol (PROVENTIL) (5 MG/ML) 0.5% nebulizer solution Take 2.5 mg by nebulization every 6 (six) hours as needed for wheezing or shortness of breath.    Yes Historical Provider, MD  gabapentin (NEURONTIN) 300 MG capsule Take 300 mg by mouth 3 (three) times daily as needed (restless leg syndrome).    Yes Historical Provider, MD  hydrochlorothiazide (HYDRODIURIL) 25 MG tablet Take 25 mg by mouth daily.   Yes Historical Provider, MD  metoprolol (TOPROL-XL) 50 MG 24 hr tablet Take 50 mg by mouth every morning.    Yes Historical Provider, MD  Multiple Vitamin (MULTIVITAMIN WITH MINERALS) TABS tablet Take 1 tablet by mouth daily.   Yes Historical Provider, MD  naproxen (NAPROSYN) 500 MG tablet Take 500 mg by mouth 2 (two) times daily with a meal.   Yes Historical Provider, MD     No Known Allergies  ROS:  Out of a complete 14 system review of symptoms, the patient complains only of the following symptoms, and all other reviewed systems are negative.  Fevers, chills, weight gain Swelling in the legs Blurred vision Shortness of breath, wheezing Feeling hot, cold, increased thirst Joint pain, cramps, aching muscles Allergies Numbness Not enough sleep Restless legs  Blood pressure 128/87, pulse 64, height  (1.651 m), weight 197 lb (89.359 kg).  Physical Exam  General: The patient is alert and cooperative at the time of the examination.  Eyes:  Pupils are equal, round, and reactive to light. Discs are flat bilaterally.  Neck: The neck is supple, no carotid bruits are noted.  Respiratory: The respiratory examination is clear.  Cardiovascular: The cardiovascular examination reveals a regular rate and rhythm, no obvious murmurs or rubs are noted.  Skin: Extremities are without significant edema.  Neurologic Exam  Mental status: The patient is alert and oriented x 3 at the time of the examination. The patient has apparent normal recent and remote memory, with an apparently normal attention span and concentration ability.  Cranial nerves: Facial symmetry is present. There is good sensation of the face to pinprick and soft touch bilaterally. The strength of the facial muscles and the muscles to head turning and shoulder shrug are normal bilaterally. Speech is well enunciated, no aphasia or dysarthria is noted. Extraocular movements are full, but on primary gaze, there is exotropia of the right eye. Visual fields are full. The tongue is midline, and the patient has symmetric elevation of the soft palate. No obvious hearing deficits are noted.  Motor: The motor testing reveals 5 over 5 strength of all 4 extremities. Good symmetric motor tone is noted throughout.  Sensory: Sensory testing is intact to pinprick, soft touch, vibration sensation, and position sense on all 4 extremities. No evidence of extinction is noted.  Coordination: Cerebellar testing reveals good finger-nose-finger and heel-to-shin bilaterally.  Gait and station: Gait is normal. Tandem gait is normal. Romberg is negative. No drift is seen.  Reflexes: Deep tendon reflexes are symmetric and normal bilaterally. Toes are downgoing bilaterally.   Assessment/Plan:  1. Nocturnal leg cramps  The patient likely does not have restless leg syndrome. She describes events most consistent with nocturnal leg cramps. She is having events frequently. The patient has gained good  benefit from quinine in the past. The patient will be given a trial on baclofen, in the future we may try other therapies such as magnesium supplementation. The patient will continue her gabapentin for now. She will follow-up in 6 months, but she will contact me if she needs a dose adjustment on her medication.  Kim Palau MD 06/22/2015 7:10 PM  [facility] [address] [place], [place] [address]  Phone [phone] Fax [phone]

## 2015-06-22 NOTE — Patient Instructions (Addendum)
  We will start baclofen at night for the leg cramps. Call if you need a dose adjustment.  Leg Cramps Leg cramps occur when a muscle or muscles tighten and you have no control over this tightening (involuntary muscle contraction). Muscle cramps can develop in any muscle, but the most common place is in the calf muscles of the leg. Those cramps can occur during exercise or when you are at rest. Leg cramps are painful, and they may last for a few seconds to a few minutes. Cramps may return several times before they finally stop. Usually, leg cramps are not caused by a serious medical problem. In many cases, the cause is not known. Some common causes include:  Overexertion.  Overuse from repetitive motions, or doing the same thing over and over.  Remaining in a certain position for a long period of time.  Improper preparation, form, or technique while performing a sport or an activity.  Dehydration.  Injury.  Side effects of some medicines.  Abnormally low levels of the salts and ions in your blood (electrolytes), especially potassium and calcium. These levels could be low if you are taking water pills (diuretics) or if you are pregnant. HOME CARE INSTRUCTIONS Watch your condition for any changes. Taking the following actions may help to lessen any discomfort that you are feeling:  Stay well-hydrated. Drink enough fluid to keep your urine clear or pale yellow.  Try massaging, stretching, and relaxing the affected muscle. Do this for several minutes at a time.  For tight or tense muscles, use a warm towel, heating pad, or hot shower water directed to the affected area.  If you are sore or have pain after a cramp, applying ice to the affected area may relieve discomfort.  Put ice in a plastic bag.  Place a towel between your skin and the bag.  Leave the ice on for 20 minutes, 2-3 times per day.  Avoid strenuous exercise for several days if you have been having frequent leg  cramps.  Make sure that your diet includes the essential minerals for your muscles to work normally.  Take medicines only as directed by your health care provider. SEEK MEDICAL CARE IF:  Your leg cramps get more severe or more frequent, or they do not improve over time.  Your foot becomes cold, numb, or blue.   This information is not intended to replace advice given to you by your health care provider. Make sure you discuss any questions you have with your health care provider.   Document Released: 10/05/2004 Document Revised: 01/12/2015 Document Reviewed: 08/05/2014 Elsevier Interactive Patient Education [employer]

## 2015-06-26 IMAGING — CR DG CHEST 2V
2 series · 2 of 2 positions shown · non-contrast
Comparison: Chest x-ray 04/24/2012.

CLINICAL DATA: Left flank pain.  Inspirational rhonchi in the left
lung base.  Cough and shortness of breath.

CHEST - 2 VIEW

[view not recorded (1 of 2)]
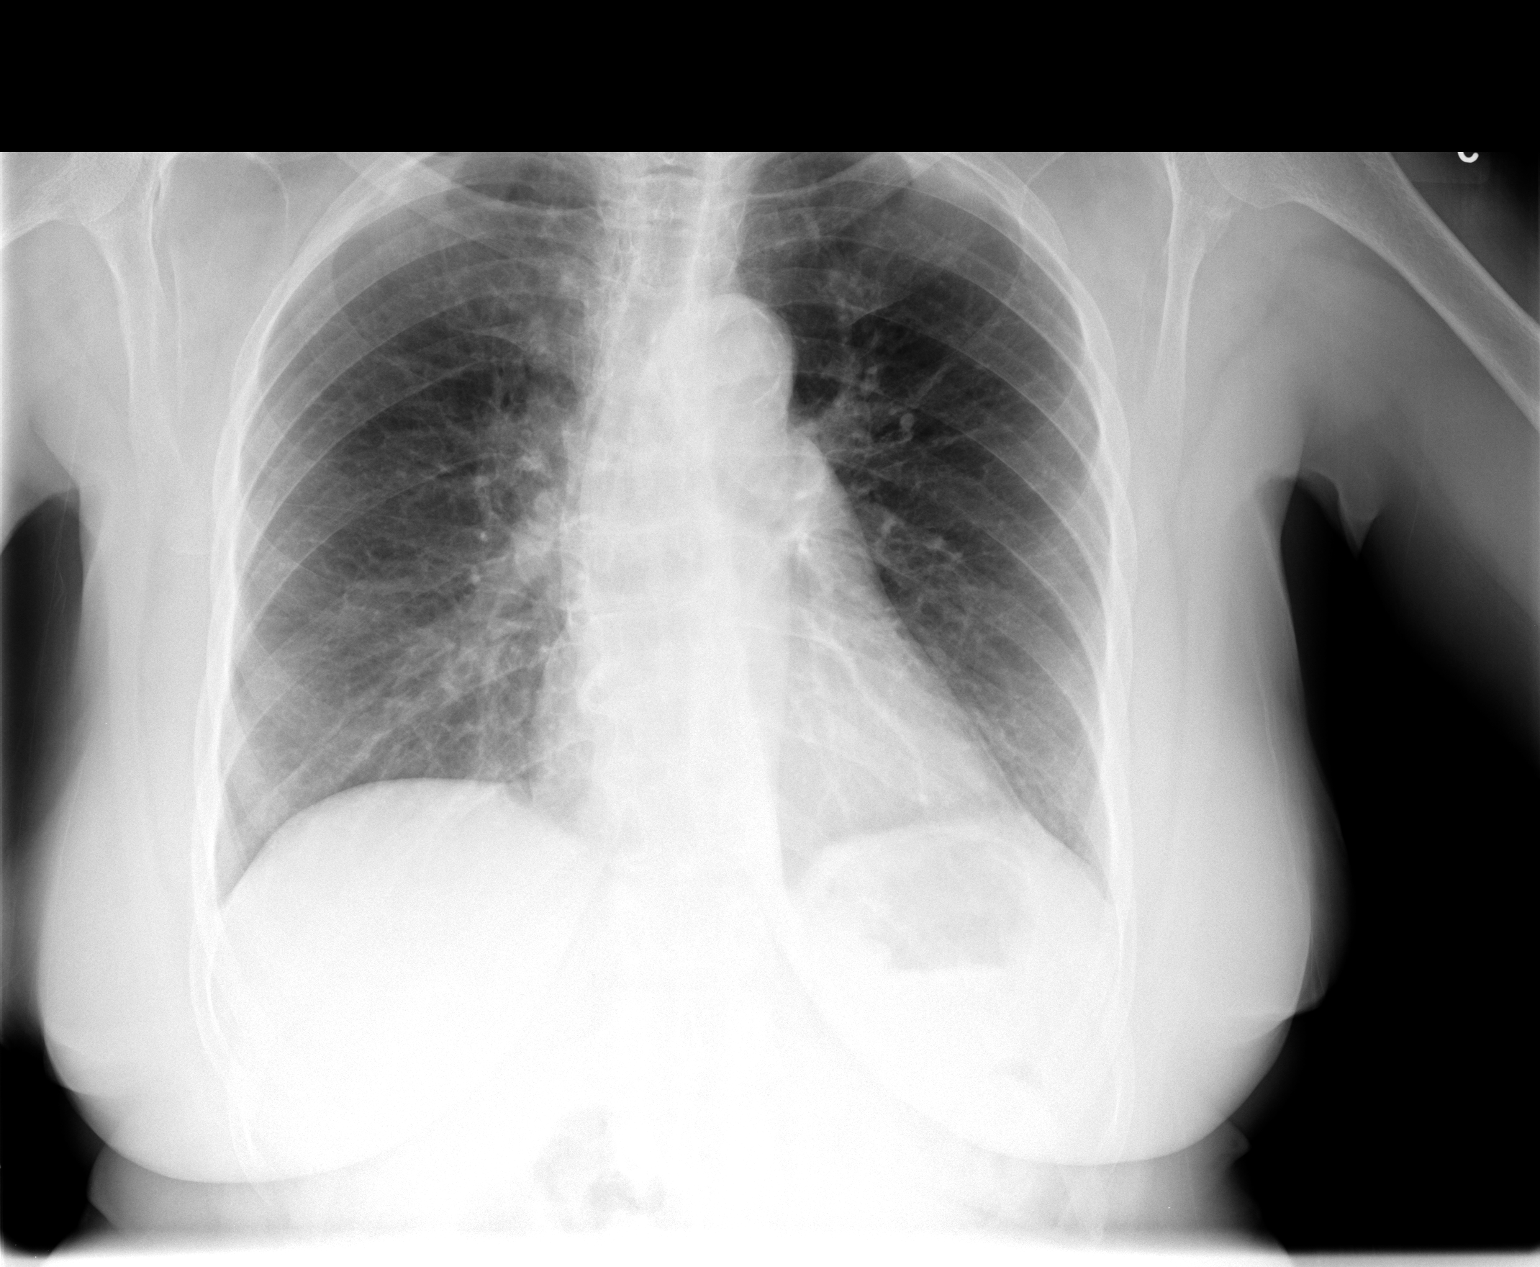

[view not recorded (2 of 2)]
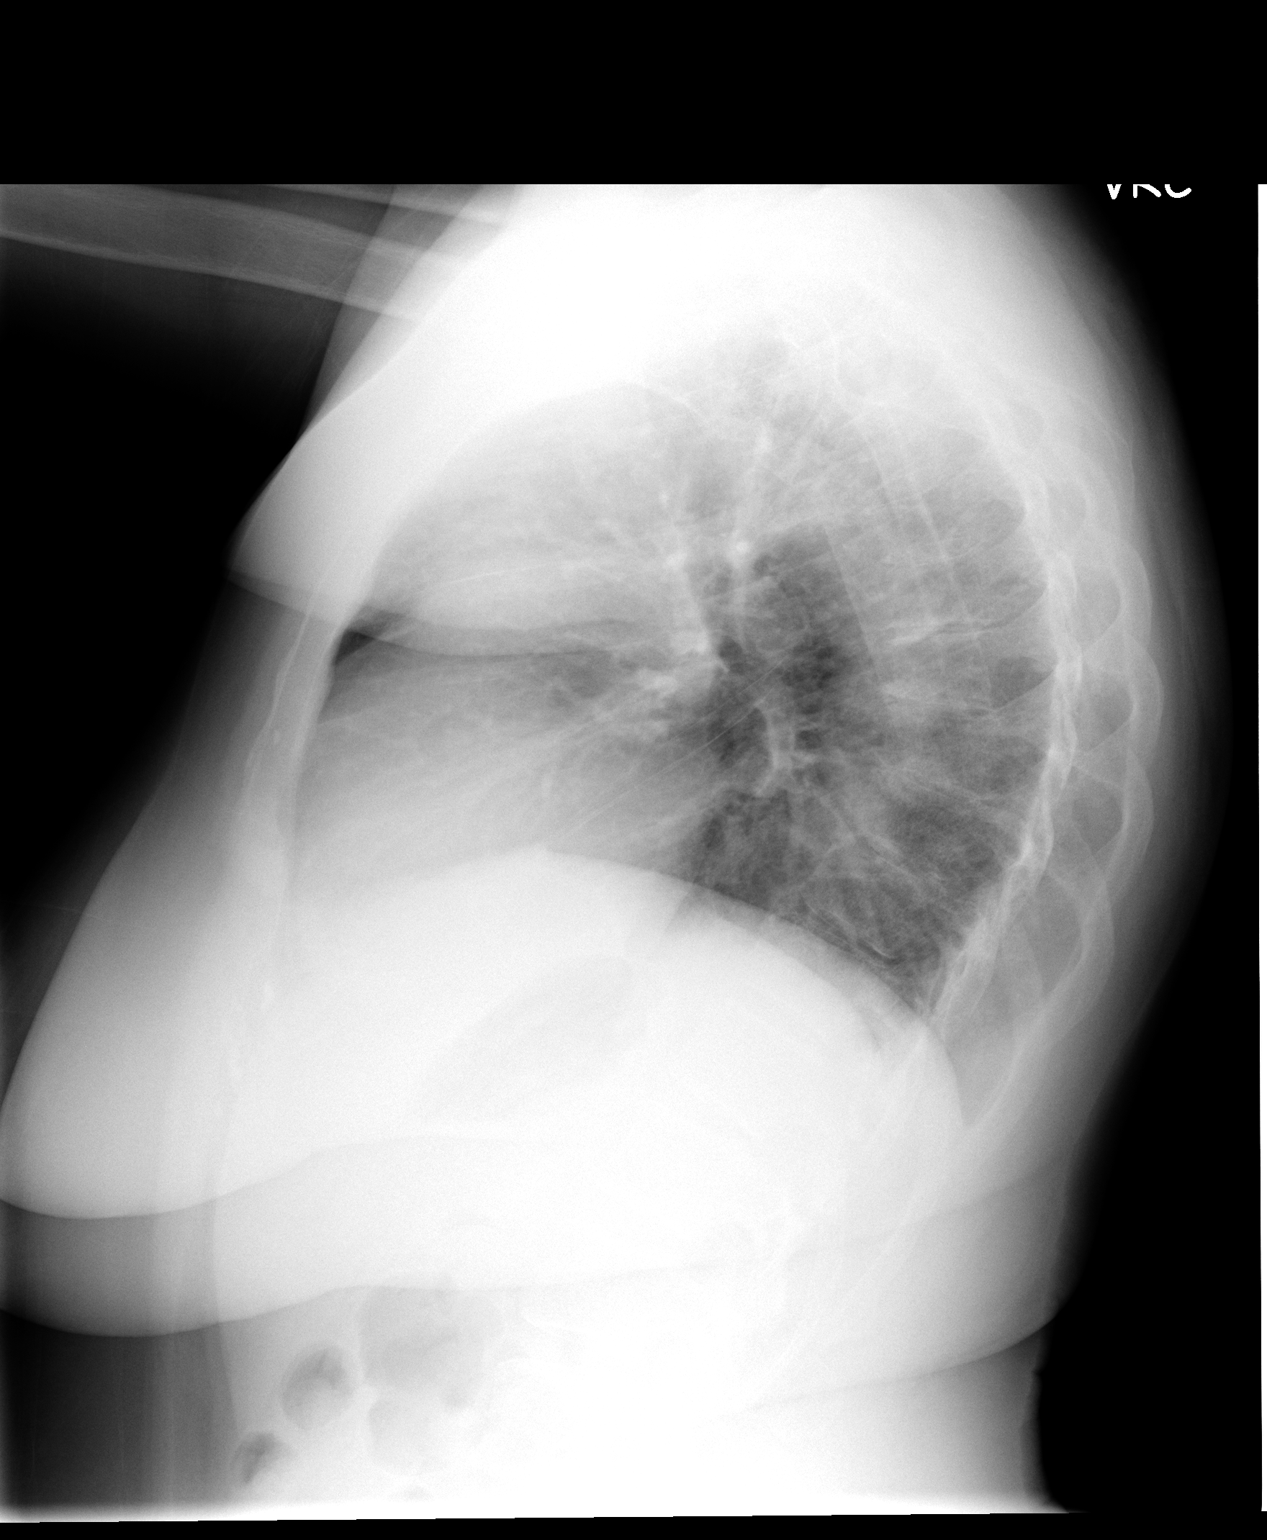

[2 of 2 positions shown; findings below may reference images not displayed]

FINDINGS: Lung volumes are normal.  No consolidative airspace
disease.  No pleural effusions.  No pneumothorax.  No pulmonary
nodule or mass noted.  Pulmonary vasculature and the
cardiomediastinal silhouette are within normal limits.
Atherosclerosis in the thoracic aorta.
IMPRESSION: 1. No radiographic evidence of acute cardiopulmonary disease.
2.  Atherosclerosis.

## 2015-08-15 ENCOUNTER — Encounter (HOSPITAL_COMMUNITY): Payer: Self-pay | Admitting: Emergency Medicine

## 2015-08-15 ENCOUNTER — Emergency Department (HOSPITAL_COMMUNITY): Payer: No Typology Code available for payment source

## 2015-08-15 ENCOUNTER — Emergency Department (HOSPITAL_COMMUNITY)
Admission: EM | Admit: 2015-08-15 | Discharge: 2015-08-15 | Disposition: A | Discharge status: Home / Self Care | Payer: No Typology Code available for payment source | Attending: Emergency Medicine | Admitting: Emergency Medicine

## 2015-08-15 DIAGNOSIS — Z791 Long term (current) use of non-steroidal anti-inflammatories (NSAID): Secondary | ICD-10-CM | POA: Diagnosis not present

## 2015-08-15 DIAGNOSIS — I10 Essential (primary) hypertension: Secondary | ICD-10-CM | POA: Diagnosis not present

## 2015-08-15 DIAGNOSIS — G2581 Restless legs syndrome: Secondary | ICD-10-CM | POA: Insufficient documentation

## 2015-08-15 DIAGNOSIS — F17210 Nicotine dependence, cigarettes, uncomplicated: Secondary | ICD-10-CM | POA: Diagnosis not present

## 2015-08-15 DIAGNOSIS — Z79899 Other long term (current) drug therapy: Secondary | ICD-10-CM | POA: Insufficient documentation

## 2015-08-15 DIAGNOSIS — R109 Unspecified abdominal pain: Secondary | ICD-10-CM | POA: Insufficient documentation

## 2015-08-15 DIAGNOSIS — Z8639 Personal history of other endocrine, nutritional and metabolic disease: Secondary | ICD-10-CM | POA: Diagnosis not present

## 2015-08-15 DIAGNOSIS — J4521 Mild intermittent asthma with (acute) exacerbation: Secondary | ICD-10-CM

## 2015-08-15 DIAGNOSIS — R05 Cough: Secondary | ICD-10-CM | POA: Diagnosis present

## 2015-08-15 DIAGNOSIS — J4531 Mild persistent asthma with (acute) exacerbation: Secondary | ICD-10-CM | POA: Insufficient documentation

## 2015-08-15 MED ORDER — ALBUTEROL SULFATE (2.5 MG/3ML) 0.083% IN NEBU
5.0000 mg | INHALATION_SOLUTION | Freq: Once | RESPIRATORY_TRACT | Status: AC
  Administered 2015-08-15: 5 mg via RESPIRATORY_TRACT
  Filled 2015-08-15: qty 6

## 2015-08-15 MED ORDER — PREDNISONE 20 MG PO TABS: 60.0000 mg | ORAL_TABLET | Freq: Every day | ORAL | Status: DC

## 2015-08-15 MED ORDER — IPRATROPIUM-ALBUTEROL 0.5-2.5 (3) MG/3ML IN SOLN
3.0000 mL | Freq: Once | RESPIRATORY_TRACT | Status: AC
  Administered 2015-08-15: 3 mL via RESPIRATORY_TRACT
  Filled 2015-08-15: qty 3

## 2015-08-15 MED ORDER — HYDROCODONE-HOMATROPINE 5-1.5 MG/5ML PO SYRP: 5.0000 mL | ORAL_SOLUTION | Freq: Four times a day (QID) | ORAL | Status: DC | PRN

## 2015-08-15 MED ORDER — PREDNISONE 20 MG PO TABS
60.0000 mg | ORAL_TABLET | Freq: Once | ORAL | Status: AC
Start: 2015-08-15 — End: 2015-08-15
  Administered 2015-08-15: 60 mg via ORAL
  Filled 2015-08-15: qty 3

## 2015-08-15 MED ORDER — AZITHROMYCIN 250 MG PO TABS: 250.0000 mg | ORAL_TABLET | Freq: Every day | ORAL | Status: DC

## 2015-08-15 NOTE — ED Notes (Addendum)
Pt from home c/o cough and shortness of breath x 2 weeks.  Yellow expectorant produced when coughing. Reports fever and chills. She reports getting the flu vaccination about 2 weeks ago. She was seen at her PCP for same and was told she just has a cold she states "but I'm getting worse".

## 2015-08-15 NOTE — ED Notes (Signed)
Pt stated that she normally has high blood pressure but has not taken her medication today.

## 2015-08-15 NOTE — Discharge Instructions (Signed)
Asthma, Adult Asthma is a recurring condition in which the airways tighten and narrow. Asthma can make it difficult to breathe. It can cause coughing, wheezing, and shortness of breath. Asthma episodes, also called asthma attacks, range from minor to life-threatening. Asthma cannot be cured, but medicines and lifestyle changes can help control it. CAUSES Asthma is believed to be caused by inherited (genetic) and environmental factors, but its exact cause is unknown. Asthma may be triggered by allergens, lung infections, or irritants in the air. Asthma triggers are different for each person. Common triggers include:   Animal dander.  Dust mites.  Cockroaches.  Pollen from trees or grass.  Mold.  Smoke.  Air pollutants such as dust, household cleaners, hair sprays, aerosol sprays, paint fumes, strong chemicals, or strong odors.  Cold air, weather changes, and winds (which increase molds and pollens in the air).  Strong emotional expressions such as crying or laughing hard.  Stress.  Certain medicines (such as aspirin) or types of drugs (such as beta-blockers).  Sulfites in foods and drinks. Foods and drinks that may contain sulfites include dried fruit, potato chips, and sparkling grape juice.  Infections or inflammatory conditions such as the flu, a cold, or an inflammation of the nasal membranes (rhinitis).  Gastroesophageal reflux disease (GERD).  Exercise or strenuous activity. SYMPTOMS Symptoms may occur immediately after asthma is triggered or many hours later. Symptoms include:  Wheezing.  Excessive nighttime or early morning coughing.  Frequent or severe coughing with a common cold.  Chest tightness.  Shortness of breath. DIAGNOSIS  The diagnosis of asthma is made by a review of your medical history and a physical exam. Tests may also be performed. These may include:  Lung function studies. These tests show how much air you breathe in and out.  Allergy  tests.  Imaging tests such as X-rays. TREATMENT  Asthma cannot be cured, but it can usually be controlled. Treatment involves identifying and avoiding your asthma triggers. It also involves medicines. There are 2 classes of medicine used for asthma treatment:   Controller medicines. These prevent asthma symptoms from occurring. They are usually taken every day.  Reliever or rescue medicines. These quickly relieve asthma symptoms. They are used as needed and provide short-term relief. Your health care provider will help you create an asthma action plan. An asthma action plan is a written plan for managing and treating your asthma attacks. It includes a list of your asthma triggers and how they may be avoided. It also includes information on when medicines should be taken and when their dosage should be changed. An action plan may also involve the use of a device called a peak flow meter. A peak flow meter measures how well the lungs are working. It helps you monitor your condition. HOME CARE INSTRUCTIONS   Take medicines only as directed by your health care provider. Speak with your health care provider if you have questions about how or when to take the medicines.  Use a peak flow meter as directed by your health care provider. Record and keep track of readings.  Understand and use the action plan to help minimize or stop an asthma attack without needing to seek medical care.  Control your home environment in the following ways to help prevent asthma attacks:  Do not smoke. Avoid being exposed to secondhand smoke.  Change your heating and air conditioning filter regularly.  Limit your use of fireplaces and wood stoves.  Get rid of pests (such as roaches   and mice) and their droppings.  Throw away plants if you see mold on them.  Clean your floors and dust regularly. Use unscented cleaning products.  Try to have someone else vacuum for you regularly. Stay out of rooms while they are  being vacuumed and for a short while afterward. If you vacuum, use a dust mask from a hardware store, a double-layered or microfilter vacuum cleaner bag, or a vacuum cleaner with a HEPA filter.  Replace carpet with wood, tile, or vinyl flooring. Carpet can trap dander and dust.  Use allergy-proof pillows, mattress covers, and box spring covers.  Wash bed sheets and blankets every week in hot water and dry them in a dryer.  Use blankets that are made of polyester or cotton.  Clean bathrooms and kitchens with bleach. If possible, have someone repaint the walls in these rooms with mold-resistant paint. Keep out of the rooms that are being cleaned and painted.  Wash hands frequently. SEEK MEDICAL CARE IF:   You have wheezing, shortness of breath, or a cough even if taking medicine to prevent attacks.  The colored mucus you cough up (sputum) is thicker than usual.  Your sputum changes from clear or white to yellow, green, gray, or bloody.  You have any problems that may be related to the medicines you are taking (such as a rash, itching, swelling, or trouble breathing).  You are using a reliever medicine more than 2-3 times per week.  Your peak flow is still at 50-79% of your personal best after following your action plan for 1 hour.  You have a fever. SEEK IMMEDIATE MEDICAL CARE IF:   You seem to be getting worse and are unresponsive to treatment during an asthma attack.  You are short of breath even at rest.  You get short of breath when doing very little physical activity.  You have difficulty eating, drinking, or talking due to asthma symptoms.  You develop chest pain.  You develop a fast heartbeat.  You have a bluish color to your lips or fingernails.  You are light-headed, dizzy, or faint.  Your peak flow is less than 50% of your personal best.   This information is not intended to replace advice given to you by your health care provider. Make sure you discuss any  questions you have with your health care provider.   Document Released: 08/28/2005 Document Revised: 05/19/2015 Document Reviewed: 03/27/2013 Elsevier Interactive Patient Education 2016 [employer] Inc.  

## 2015-08-15 NOTE — ED Provider Notes (Signed)
CSN: [id number]     Arrival date & time 08/15/15  0404 History   First MD Initiated Contact with Patient 08/15/15 [phone]     Chief Complaint  Patient presents with  . Cough     (Consider location/radiation/quality/duration/timing/severity/associated sxs/prior Treatment) HPI Comments: Patient presents to the ER for evaluation of cough. Patient reports that she has a history of asthma. She has been expressing cough with progressively worsening shortness of breath over a period of 2 weeks. Symptoms began after she got a flu vaccination. She saw her doctor and was told that she simply had a cold, but symptoms have worsened. Cough is productive of thick green sputum. She has used her albuterol without improvement.  Patient is a 59 y.o. female presenting with cough.  Cough Associated symptoms: wheezing     Past Medical History  Diagnosis Date  . Hypertension   . Asthma   . Thyroid disease   . RLS (restless legs syndrome) 06/22/2015  . Nocturnal leg cramps 06/22/2015   Past Surgical History  Procedure Laterality Date  . Abdominal hysterectomy    . Appendectomy     Family History  Problem Relation Age of Onset  . Kidney failure Mother   . Heart failure Mother   . Heart attack Father   . Heart attack Brother    Social History  Substance Use Topics  . Smoking status: Current Every Day Smoker -- 0.25 packs/day    Types: Cigarettes  . Smokeless tobacco: Never Used  . Alcohol Use: No   OB History    No data available     Review of Systems  Respiratory: Positive for cough and wheezing.   All other systems reviewed and are negative.     Allergies  Review of patient's allergies indicates no known allergies.  Home Medications   Prior to Admission medications   Medication Sig Start Date End Date Taking? Authorizing Provider  albuterol (PROVENTIL HFA;VENTOLIN HFA) 108 (90 BASE) MCG/ACT inhaler Inhale 2 puffs into the lungs every 6 (six) hours as needed for wheezing or  shortness of breath.    Historical Provider, MD  albuterol (PROVENTIL) (5 MG/ML) 0.5% nebulizer solution Take 2.5 mg by nebulization every 6 (six) hours as needed for wheezing or shortness of breath.     Historical Provider, MD  baclofen (LIORESAL) 10 MG tablet Take 1 tablet (10 mg total) by mouth at bedtime. 06/22/15   York Spaniel, MD  gabapentin (NEURONTIN) 300 MG capsule Take 600 mg by mouth at bedtime.     Historical Provider, MD  hydrochlorothiazide (HYDRODIURIL) 25 MG tablet Take 25 mg by mouth daily.    Historical Provider, MD  metoprolol (TOPROL-XL) 50 MG 24 hr tablet Take 50 mg by mouth every morning.     Historical Provider, MD  Multiple Vitamin (MULTIVITAMIN WITH MINERALS) TABS tablet Take 1 tablet by mouth daily.    Historical Provider, MD  naproxen (NAPROSYN) 500 MG tablet Take 500 mg by mouth 2 (two) times daily with a meal.    Historical Provider, MD   BP 143/82 mmHg  Pulse 77  Temp(Src) 98.1 F (36.7 C) (Oral)  Resp 22  SpO2 96% Physical Exam  Constitutional: She is oriented to person, place, and time. She appears well-developed and well-nourished. No distress.  HENT:  Head: Normocephalic and atraumatic.  Right Ear: Hearing normal.  Left Ear: Hearing normal.  Nose: Nose normal.  Mouth/Throat: Oropharynx is clear and moist and mucous membranes are normal.  Eyes: Conjunctivae and EOM  are normal. Pupils are equal, round, and reactive to light.  Neck: Normal range of motion. Neck supple.  Cardiovascular: Regular rhythm, S1 normal and S2 normal.  Exam reveals no gallop and no friction rub.   No murmur heard. Pulmonary/Chest: Effort normal. No respiratory distress. She has wheezes. She exhibits no tenderness.  Abdominal: Soft. Normal appearance and bowel sounds are normal. There is no hepatosplenomegaly. There is no tenderness. There is no rebound, no guarding, no tenderness at McBurney's point and negative Murphy's sign. No hernia.  Musculoskeletal: Normal range of  motion.  Neurological: She is alert and oriented to person, place, and time. She has normal strength. No cranial nerve deficit or sensory deficit. Coordination normal. GCS eye subscore is 4. GCS verbal subscore is 5. GCS motor subscore is 6.  Skin: Skin is warm, dry and intact. No rash noted. No cyanosis.  Psychiatric: She has a normal mood and affect. Her speech is normal and behavior is normal. Thought content normal.  Nursing note and vitals reviewed.   ED Course  Procedures (including critical care time) Labs Review Labs Reviewed - No data to display  Imaging Review Dg Chest 2 View  08/15/2015  CLINICAL DATA:  Cough and shortness of breath for 2 weeks. Fevers and chills. History of hypertension asthma. EXAM: CHEST  2 VIEW COMPARISON:  Chest radiograph Feb 05, 2014 FINDINGS: Cardiomediastinal silhouette is normal. Similar bronchitic changes. The lungs are clear without pleural effusions or focal consolidations. Trachea projects midline and there is no pneumothorax. Soft tissue planes and included osseous structures are non-suspicious. Mild degenerative change of thoracic spine. IMPRESSION: Similar bronchitic changes without focal consolidation. Electronically Signed   By: Awilda Metroourtnay  Bloomer M.D.   On: 08/15/2015 05:15   I have personally reviewed and evaluated these images and lab results as part of my medical decision-making.   EKG Interpretation None      MDM   Final diagnoses:  None   asthmatic bronchitis  Presents to the emergency department for evaluation of cough and difficulty breathing. Symptoms ongoing for 2 weeks. She does have a history of asthma. Examination revealed diffuse bronchospasm on arrival, but has adequate air movement. Improved with nebulizer treatments. Will require corticosteroid therapy as well, is appropriate for outpatient management.    Gilda Creasehristopher J Aulani Shipton, MD 08/15/15 [phone]

## 2015-08-29 ENCOUNTER — Emergency Department (HOSPITAL_COMMUNITY): Payer: No Typology Code available for payment source

## 2015-08-29 ENCOUNTER — Emergency Department (HOSPITAL_COMMUNITY)
Admission: EM | Admit: 2015-08-29 | Discharge: 2015-08-30 | Disposition: A | Discharge status: Home / Self Care | Payer: No Typology Code available for payment source | Attending: Emergency Medicine | Admitting: Emergency Medicine

## 2015-08-29 ENCOUNTER — Encounter (HOSPITAL_COMMUNITY): Payer: Self-pay | Admitting: Nurse Practitioner

## 2015-08-29 DIAGNOSIS — Z79899 Other long term (current) drug therapy: Secondary | ICD-10-CM | POA: Diagnosis not present

## 2015-08-29 DIAGNOSIS — R1011 Right upper quadrant pain: Secondary | ICD-10-CM | POA: Diagnosis not present

## 2015-08-29 DIAGNOSIS — F17210 Nicotine dependence, cigarettes, uncomplicated: Secondary | ICD-10-CM | POA: Diagnosis not present

## 2015-08-29 DIAGNOSIS — Z90710 Acquired absence of both cervix and uterus: Secondary | ICD-10-CM | POA: Diagnosis not present

## 2015-08-29 DIAGNOSIS — Z8639 Personal history of other endocrine, nutritional and metabolic disease: Secondary | ICD-10-CM | POA: Diagnosis not present

## 2015-08-29 DIAGNOSIS — J45909 Unspecified asthma, uncomplicated: Secondary | ICD-10-CM | POA: Insufficient documentation

## 2015-08-29 DIAGNOSIS — R109 Unspecified abdominal pain: Secondary | ICD-10-CM

## 2015-08-29 DIAGNOSIS — G2581 Restless legs syndrome: Secondary | ICD-10-CM | POA: Insufficient documentation

## 2015-08-29 DIAGNOSIS — Z9049 Acquired absence of other specified parts of digestive tract: Secondary | ICD-10-CM | POA: Diagnosis not present

## 2015-08-29 DIAGNOSIS — J40 Bronchitis, not specified as acute or chronic: Secondary | ICD-10-CM

## 2015-08-29 DIAGNOSIS — I10 Essential (primary) hypertension: Secondary | ICD-10-CM | POA: Diagnosis not present

## 2015-08-29 DIAGNOSIS — R079 Chest pain, unspecified: Secondary | ICD-10-CM | POA: Diagnosis present

## 2015-08-29 DIAGNOSIS — Z7952 Long term (current) use of systemic steroids: Secondary | ICD-10-CM | POA: Diagnosis not present

## 2015-08-29 LAB — COMPREHENSIVE METABOLIC PANEL
ALK PHOS: 68 U/L (ref 38–126)
ALT: 11 U/L — ABNORMAL LOW (ref 14–54)
AST: 14 U/L — ABNORMAL LOW (ref 15–41)
Albumin: 4 g/dL (ref 3.5–5.0)
Anion gap: 10 (ref 5–15)
BILIRUBIN TOTAL: 0.2 mg/dL — AB (ref 0.3–1.2)
BUN: 20 mg/dL (ref 6–20)
CALCIUM: 9.1 mg/dL (ref 8.9–10.3)
CO2: 29 mmol/L (ref 22–32)
Chloride: 101 mmol/L (ref 101–111)
Creatinine, Ser: 1.36 mg/dL — ABNORMAL HIGH (ref 0.44–1.00)
GFR calc Af Amer: 48 mL/min — ABNORMAL LOW (ref >60)
GFR calc non Af Amer: 42 mL/min — ABNORMAL LOW (ref >60)
Glucose, Bld: 96 mg/dL (ref 65–99)
POTASSIUM: 4.1 mmol/L (ref 3.5–5.1)
Sodium: 140 mmol/L (ref 135–145)
TOTAL PROTEIN: 7.2 g/dL (ref 6.5–8.1)

## 2015-08-29 LAB — URINALYSIS, ROUTINE W REFLEX MICROSCOPIC
BILIRUBIN URINE: NEGATIVE
GLUCOSE, UA: NEGATIVE mg/dL
Hgb urine dipstick: NEGATIVE
KETONES UR: NEGATIVE mg/dL
Leukocytes, UA: NEGATIVE
NITRITE: NEGATIVE
Protein, ur: NEGATIVE mg/dL
Specific Gravity, Urine: 1.025 (ref 1.005–1.030)
pH: 6 (ref 5.0–8.0)

## 2015-08-29 LAB — CBC WITH DIFFERENTIAL/PLATELET
Basophils Absolute: 0 10*3/uL (ref 0.0–0.1)
Basophils Relative: 0 %
Eosinophils Absolute: 0.3 10*3/uL (ref 0.0–0.7)
Eosinophils Relative: 3 %
HEMATOCRIT: 42.7 % (ref 36.0–46.0)
HEMOGLOBIN: 14 g/dL (ref 12.0–15.0)
Lymphocytes Relative: 44 %
Lymphs Abs: 4 10*3/uL (ref 0.7–4.0)
MCH: 29.5 pg (ref 26.0–34.0)
MCHC: 32.8 g/dL (ref 30.0–36.0)
MCV: 89.9 fL (ref 78.0–100.0)
Monocytes Absolute: 0.6 10*3/uL (ref 0.1–1.0)
Monocytes Relative: 7 %
NEUTROS ABS: 4.1 10*3/uL (ref 1.7–7.7)
Neutrophils Relative %: 46 %
Platelets: 292 10*3/uL (ref 150–400)
RBC: 4.75 MIL/uL (ref 3.87–5.11)
RDW: 13.1 % (ref 11.5–15.5)
WBC: 9 10*3/uL (ref 4.0–10.5)

## 2015-08-29 LAB — LIPASE, BLOOD: LIPASE: 37 U/L (ref 11–51)

## 2015-08-29 MED ORDER — OXYCODONE-ACETAMINOPHEN 5-325 MG PO TABS
1.0000 | ORAL_TABLET | Freq: Once | ORAL | Status: AC
  Administered 2015-08-29: 1 via ORAL
  Filled 2015-08-29: qty 1

## 2015-08-29 NOTE — ED Provider Notes (Signed)
CSN: [id number]     Arrival date & time 08/29/15  1829 History   First MD Initiated Contact with Patient 08/29/15 1853     Chief Complaint  Patient presents with  . Chest Pain  . Back Pain     (Consider location/radiation/quality/duration/timing/severity/associated sxs/prior Treatment) HPI [name] is a 59 y.o. female with history of hypertension, asthma, bronchitis, presents to emergency department complaining of right lower chest pain radiating to the right flank. Patient states she was diagnosed with bronchitis 2 weeks ago, states finished course of steroids and Z-Pak. She states symptoms improved after about a week, but states cough is now getting worse again. Patient reports pain with coughing. She also reports pain with movement in certain positions. She denies any fever or chills. No nausea or vomiting. No shortness of breath. She states cough is productive with clear sputum. Patient did not take any medications for this. Patient is not sure exactly when pain started, however states today pain was severe starting this morning. Patient denies any urinary symptoms. Denies problems with gallbladder the past.  Past Medical History  Diagnosis Date  . Hypertension   . Asthma   . Thyroid disease   . RLS (restless legs syndrome) 06/22/2015  . Nocturnal leg cramps 06/22/2015   Past Surgical History  Procedure Laterality Date  . Abdominal hysterectomy    . Appendectomy     Family History  Problem Relation Age of Onset  . Kidney failure Mother   . Heart failure Mother   . Heart attack Father   . Heart attack Brother    Social History  Substance Use Topics  . Smoking status: Current Every Day Smoker -- 0.25 packs/day    Types: Cigarettes  . Smokeless tobacco: Never Used  . Alcohol Use: No   OB History    No data available     Review of Systems  Constitutional: Negative for fever and chills.  Respiratory: Positive for cough and chest tightness. Negative for shortness of  breath.   Cardiovascular: Positive for chest pain. Negative for palpitations and leg swelling.  Gastrointestinal: Positive for abdominal pain. Negative for nausea, vomiting, diarrhea and abdominal distention.  Genitourinary: Negative for dysuria, flank pain, vaginal bleeding, vaginal discharge, vaginal pain and pelvic pain.  Musculoskeletal: Negative for myalgias, arthralgias, neck pain and neck stiffness.  Skin: Negative for rash.  Neurological: Negative for dizziness, weakness and headaches.  All other systems reviewed and are negative.     Allergies  Review of patient's allergies indicates no known allergies.  Home Medications   Prior to Admission medications   Medication Sig Start Date End Date Taking? Authorizing Provider  albuterol (PROVENTIL HFA;VENTOLIN HFA) 108 (90 BASE) MCG/ACT inhaler Inhale 2 puffs into the lungs every 6 (six) hours as needed for wheezing or shortness of breath.   Yes Historical Provider, MD  albuterol (PROVENTIL) (5 MG/ML) 0.5% nebulizer solution Take 2.5 mg by nebulization every 6 (six) hours as needed for wheezing or shortness of breath.    Yes Historical Provider, MD  alum & mag hydroxide-simeth (MAALOX/MYLANTA) 200-200-20 MG/5ML suspension Take 30 mLs by mouth every 6 (six) hours as needed for indigestion or heartburn.   Yes Historical Provider, MD  baclofen (LIORESAL) 10 MG tablet Take 1 tablet (10 mg total) by mouth at bedtime. 06/22/15  Yes York Spaniel, MD  calcium carbonate (TUMS - DOSED IN MG ELEMENTAL CALCIUM) 500 MG chewable tablet Chew 4 tablets by mouth daily as needed for indigestion or heartburn.  Yes Historical Provider, MD  dextromethorphan (DELSYM) 30 MG/5ML liquid Take 30 mg by mouth as needed for cough.   Yes Historical Provider, MD  gabapentin (NEURONTIN) 400 MG capsule Take 1,600 mg by mouth at bedtime.  07/27/15  Yes Historical Provider, MD  metoprolol (TOPROL-XL) 50 MG 24 hr tablet Take 50 mg by mouth every morning.    Yes  Historical Provider, MD  Multiple Vitamin (MULTIVITAMIN WITH MINERALS) TABS tablet Take 1 tablet by mouth daily.   Yes Historical Provider, MD  naproxen sodium (ANAPROX) 220 MG tablet Take 220 mg by mouth 2 (two) times daily as needed (pain/headaches).   Yes Historical Provider, MD  azithromycin (ZITHROMAX) 250 MG tablet Take 1 tablet (250 mg total) by mouth daily. Take first 2 tablets together, then 1 every day until finished. 08/15/15   Gilda Crease, MD  HYDROcodone-homatropine (HYCODAN) 5-1.5 MG/5ML syrup Take 5 mLs by mouth every 6 (six) hours as needed for cough. 08/15/15   Gilda Crease, MD  predniSONE (DELTASONE) 20 MG tablet Take 3 tablets (60 mg total) by mouth daily with breakfast. 08/15/15   Gilda Crease, MD   BP 116/87 mmHg  Pulse 89  Temp(Src) 97.6 F (36.4 C) (Oral)  Resp 18  SpO2 93% Physical Exam  Constitutional: She is oriented to person, place, and time. She appears well-developed and well-nourished. No distress.  HENT:  Head: Normocephalic.  Eyes: Conjunctivae are normal.  Neck: Neck supple.  Cardiovascular: Normal rate, regular rhythm and normal heart sounds.   Pulmonary/Chest: Effort normal and breath sounds normal. No respiratory distress. She has no wheezes. She has no rales.  Abdominal: Soft. Bowel sounds are normal. She exhibits no distension. There is tenderness. There is no rebound.  Right upper quadrant tenderness. Right CVA tenderness  Musculoskeletal: She exhibits no edema.  Neurological: She is alert and oriented to person, place, and time.  Skin: Skin is warm and dry.  Psychiatric: She has a normal mood and affect. Her behavior is normal.  Nursing note and vitals reviewed.   ED Course  Procedures (including critical care time) Labs Review Labs Reviewed  COMPREHENSIVE METABOLIC PANEL - Abnormal; Notable for the following:    Creatinine, Ser 1.36 (*)    AST 14 (*)    ALT 11 (*)    Total Bilirubin 0.2 (*)    GFR calc non Af  Amer 42 (*)    GFR calc Af Amer 48 (*)    All other components within normal limits  CBC WITH DIFFERENTIAL/PLATELET  LIPASE, BLOOD  URINALYSIS, ROUTINE W REFLEX MICROSCOPIC (NOT AT [facility])    Imaging Review Dg Chest 2 View  08/29/2015  CLINICAL DATA:  Pt having left lower chest pains under left breast today. Pt is SOB. Not much coughing. Pt takes HTN med. Not diabetic. Current smoker of 27 years. Hx of asthma and COPD. EXAM: CHEST  2 VIEW COMPARISON:  08/15/2015 FINDINGS: Cardiac silhouette normal in size and configuration. No mediastinal or hilar masses or evidence of adenopathy. Lungs are clear.  No pleural effusion or pneumothorax. Bony thorax is demineralized but grossly intact. IMPRESSION: No active cardiopulmonary disease. Electronically Signed   By: Amie Portland M.D.   On: 08/29/2015 19:55   US Abdomen Complete  08/29/2015  CLINICAL DATA:  Abdominal pain, right flank and upper quadrant EXAM: ULTRASOUND ABDOMEN COMPLETE COMPARISON:  Abdominal CT 08/18/2008 FINDINGS: Gallbladder: No gallstones or wall thickening visualized. No sonographic Murphy sign noted. Common bile duct: Diameter: 4 mm. Where visualized,  no filling defect. Liver: No focal lesion identified. Within normal limits in parenchymal echogenicity. IVC: No abnormality visualized. Pancreas: Visualized portion unremarkable. Spleen: Size and appearance within normal limits. Right Kidney: Length: 10 cm. Echogenicity within normal limits. No mass or hydronephrosis visualized. Left Kidney: Length: 10 cm. Echogenicity within normal limits. No mass or hydronephrosis visualized. Abdominal aorta: Atheromatous changes in the wall with ectatic distal aorta measuring 27 mm maximal diameter. IMPRESSION: 1. No acute finding or explanation for symptoms. 2. 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 Recommend followup by ultrasound in 5 years. This recommendation follows ACR consensus guidelines: White Paper of the  ACR Incidental Findings Committee II on Vascular Findings. J Am Coll Radiol 2013; 10:789-794. Electronically Signed   By: Marnee SpringJonathon  Watts M.D.   On: 08/29/2015 23:14   I have personally reviewed and evaluated these images and lab results as part of my medical decision-making.   EKG Interpretation   Date/Time:  Sunday August 29 2015 18:36:07 EST Ventricular Rate:  88 PR Interval:  147 QRS Duration: 74 QT Interval:  357 QTC Calculation: 432 R Axis:   100 Text Interpretation:  Sinus rhythm Biatrial enlargement Right axis  deviation No significant change since last tracing Confirmed by POLLINA   MD, CHRISTOPHER [id number]) on 08/30/2015 12:17:01 AM      MDM   Final diagnoses:  Bronchitis  Right flank pain    Patient with right-sided chest pain/right upper abdominal tenderness. She is coughing. Vital signs are normal. Will get labs, chest x-ray, LFTs and lipase. Will get urinalysis. Patient is not wheezing on exam. Will order Percocet for pain  Pt's workup unremakrable. She continues to have pain and RUQ tenderness. US ordered to ro biliary colic. Pain improved with percocet but still is there.   12:06 AM [place] negative. Pt does have low oxygen in low 90s and continues to have pain and reports exertional sob. Will get CT angio to ro PE.   1:10 AM  CT angiogram is negative. Patient's creatinine was slightly elevated, 1.36. We'll give a bolus of fluid. Will have to follow-up with her PCP for recheck of her creatinine. Patient agrees to the plan, will discharge home. At time of discharge vital signs are all within normal. She is in no acute distress. I suspect pain could be musculoskeletal or from coughing. Instructed to return if worsening symptoms  Filed Vitals:   08/29/15 2130 08/29/15 2200 08/29/15 2230 08/29/15 2300  BP: 117/85 116/81 101/58 139/74  Pulse: 72 71 65 66  Temp:      TempSrc:      Resp: 17 17 16 18   SpO2: 93% 93% 90% 92%     Jaynie Crumbleatyana Ezio Wieck, PA-C 08/30/15  0110  Leta BaptistEmily Roe Nguyen, MD 09/02/15 [phone]

## 2015-08-29 NOTE — ED Notes (Signed)
Pt c/o of right chest pain radiating to her lower back, pt was seen recently for acute bronchitis that she says has not improved. Denies any other symptoms.

## 2015-08-29 NOTE — ED Notes (Signed)
Ultrasound at bedside

## 2015-08-30 ENCOUNTER — Encounter (HOSPITAL_COMMUNITY): Payer: Self-pay

## 2015-08-30 ENCOUNTER — Emergency Department (HOSPITAL_COMMUNITY): Payer: No Typology Code available for payment source

## 2015-08-30 MED ORDER — HYDROCODONE-ACETAMINOPHEN 5-325 MG PO TABS: 1.0000 | ORAL_TABLET | ORAL | Status: DC | PRN

## 2015-08-30 MED ORDER — IOHEXOL 350 MG/ML SOLN
100.0000 mL | Freq: Once | INTRAVENOUS | Status: AC | PRN
  Administered 2015-08-30: 100 mL via INTRAVENOUS

## 2015-08-30 MED ORDER — PREDNISONE 20 MG PO TABS: 40.0000 mg | ORAL_TABLET | Freq: Every day | ORAL | Status: DC

## 2015-08-30 MED ORDER — SODIUM CHLORIDE 0.9 % IV BOLUS (SEPSIS)
1000.0000 mL | Freq: Once | INTRAVENOUS | Status: AC
Start: 2015-08-30 — End: 2015-08-30
  Administered 2015-08-30: 1000 mL via INTRAVENOUS

## 2015-08-30 NOTE — Discharge Instructions (Signed)
Take  Your inhaler 2 puffs every 4 hrs. Take prednisone as prescribed until all gone. norco for severe pain. Follow up with your doctor for recheck.  Return if worsening  Acute Bronchitis Bronchitis is inflammation of the airways that extend from the windpipe into the lungs (bronchi). The inflammation often causes mucus to develop. This leads to a cough, which is the most common symptom of bronchitis.  In acute bronchitis, the condition usually develops suddenly and goes away over time, usually in a couple weeks. Smoking, allergies, and asthma can make bronchitis worse. Repeated episodes of bronchitis may cause further lung problems.  CAUSES Acute bronchitis is most often caused by the same virus that causes a cold. The virus can spread from person to person (contagious) through coughing, sneezing, and touching contaminated objects. SIGNS AND SYMPTOMS   Cough.   Fever.   Coughing up mucus.   Body aches.   Chest congestion.   Chills.   Shortness of breath.   Sore throat.  DIAGNOSIS  Acute bronchitis is usually diagnosed through a physical exam. Your health care provider will also ask you questions about your medical history. Tests, such as chest X-rays, are sometimes done to rule out other conditions.  TREATMENT  Acute bronchitis usually goes away in a couple weeks. Oftentimes, no medical treatment is necessary. Medicines are sometimes given for relief of fever or cough. Antibiotic medicines are usually not needed but may be prescribed in certain situations. In some cases, an inhaler may be recommended to help reduce shortness of breath and control the cough. A cool mist vaporizer may also be used to help thin bronchial secretions and make it easier to clear the chest.  HOME CARE INSTRUCTIONS  Get plenty of rest.   Drink enough fluids to keep your urine clear or pale yellow (unless you have a medical condition that requires fluid restriction). Increasing fluids may help thin  your respiratory secretions (sputum) and reduce chest congestion, and it will prevent dehydration.   Take medicines only as directed by your health care provider.  If you were prescribed an antibiotic medicine, finish it all even if you start to feel better.  Avoid smoking and secondhand smoke. Exposure to cigarette smoke or irritating chemicals will make bronchitis worse. If you are a smoker, consider using nicotine gum or skin patches to help control withdrawal symptoms. Quitting smoking will help your lungs heal faster.   Reduce the chances of another bout of acute bronchitis by washing your hands frequently, avoiding people with cold symptoms, and trying not to touch your hands to your mouth, nose, or eyes.   Keep all follow-up visits as directed by your health care provider.  SEEK MEDICAL CARE IF: Your symptoms do not improve after 1 week of treatment.  SEEK IMMEDIATE MEDICAL CARE IF:  You develop an increased fever or chills.   You have chest pain.   You have severe shortness of breath.  You have bloody sputum.   You develop dehydration.  You faint or repeatedly feel like you are going to pass out.  You develop repeated vomiting.  You develop a severe headache. MAKE SURE YOU:   Understand these instructions.  Will watch your condition.  Will get help right away if you are not doing well or get worse.   This information is not intended to replace advice given to you by your health care provider. Make sure you discuss any questions you have with your health care provider.   Document Released: 10/05/2004  Document Revised: 09/18/2014 Document Reviewed: 02/18/2013 Elsevier Interactive Patient Education 2016 [employer]   Flank Pain Flank pain refers to pain that is located on the side of the body between the upper abdomen and the back. The pain may occur over a short period of time (acute) or may be long-term or reoccurring (chronic). It may be mild or severe.  Flank pain can be caused by many things. CAUSES  Some of the more common causes of flank pain include:  Muscle strains.   Muscle spasms.   A disease of your spine (vertebral disk disease).   A lung infection (pneumonia).   Fluid around your lungs (pulmonary edema).   A kidney infection.   Kidney stones.   A very painful skin rash caused by the chickenpox virus (shingles).   Gallbladder disease.  HOME CARE INSTRUCTIONS  Home care will depend on the cause of your pain. In general,  Rest as directed by your caregiver.  Drink enough fluids to keep your urine clear or pale yellow.  Only take over-the-counter or prescription medicines as directed by your caregiver. Some medicines may help relieve the pain.  Tell your caregiver about any changes in your pain.  Follow up with your caregiver as directed. SEEK IMMEDIATE MEDICAL CARE IF:   Your pain is not controlled with medicine.   You have new or worsening symptoms.  Your pain increases.   You have abdominal pain.   You have shortness of breath.   You have persistent nausea or vomiting.   You have swelling in your abdomen.   You feel faint or pass out.   You have blood in your urine.  You have a fever or persistent symptoms for more than 2-3 days.  You have a fever and your symptoms suddenly get worse. MAKE SURE YOU:   Understand these instructions.  Will watch your condition.  Will get help right away if you are not doing well or get worse.   This information is not intended to replace advice given to you by your health care provider. Make sure you discuss any questions you have with your health care provider.   Document Released: 10/19/2005 Document Revised: 05/22/2012 Document Reviewed: 04/11/2012 Elsevier Interactive Patient Education [employer]2016 [employer]

## 2015-09-06 ENCOUNTER — Inpatient Hospital Stay (HOSPITAL_COMMUNITY)
Admission: EM | Admit: 2015-09-06 | Discharge: 2015-09-09 | DRG: 190 | Disposition: A | Discharge status: Home / Self Care | Payer: No Typology Code available for payment source | Attending: Internal Medicine | Admitting: Internal Medicine

## 2015-09-06 ENCOUNTER — Emergency Department (HOSPITAL_COMMUNITY): Payer: No Typology Code available for payment source

## 2015-09-06 ENCOUNTER — Encounter (HOSPITAL_COMMUNITY): Payer: Self-pay | Admitting: Emergency Medicine

## 2015-09-06 DIAGNOSIS — I10 Essential (primary) hypertension: Secondary | ICD-10-CM | POA: Diagnosis present

## 2015-09-06 DIAGNOSIS — F17200 Nicotine dependence, unspecified, uncomplicated: Secondary | ICD-10-CM | POA: Diagnosis present

## 2015-09-06 DIAGNOSIS — F17210 Nicotine dependence, cigarettes, uncomplicated: Secondary | ICD-10-CM | POA: Diagnosis present

## 2015-09-06 DIAGNOSIS — G4734 Idiopathic sleep related nonobstructive alveolar hypoventilation: Secondary | ICD-10-CM | POA: Diagnosis present

## 2015-09-06 DIAGNOSIS — Z79899 Other long term (current) drug therapy: Secondary | ICD-10-CM

## 2015-09-06 DIAGNOSIS — Z7952 Long term (current) use of systemic steroids: Secondary | ICD-10-CM

## 2015-09-06 DIAGNOSIS — Z66 Do not resuscitate: Secondary | ICD-10-CM | POA: Diagnosis present

## 2015-09-06 DIAGNOSIS — J441 Chronic obstructive pulmonary disease with (acute) exacerbation: Secondary | ICD-10-CM | POA: Diagnosis not present

## 2015-09-06 DIAGNOSIS — Z8249 Family history of ischemic heart disease and other diseases of the circulatory system: Secondary | ICD-10-CM

## 2015-09-06 DIAGNOSIS — J9601 Acute respiratory failure with hypoxia: Secondary | ICD-10-CM | POA: Diagnosis present

## 2015-09-06 DIAGNOSIS — F341 Dysthymic disorder: Secondary | ICD-10-CM | POA: Diagnosis present

## 2015-09-06 DIAGNOSIS — J449 Chronic obstructive pulmonary disease, unspecified: Secondary | ICD-10-CM | POA: Diagnosis present

## 2015-09-06 DIAGNOSIS — G2581 Restless legs syndrome: Secondary | ICD-10-CM | POA: Diagnosis present

## 2015-09-06 DIAGNOSIS — R0602 Shortness of breath: Secondary | ICD-10-CM | POA: Diagnosis not present

## 2015-09-06 DIAGNOSIS — E079 Disorder of thyroid, unspecified: Secondary | ICD-10-CM | POA: Diagnosis present

## 2015-09-06 DIAGNOSIS — J209 Acute bronchitis, unspecified: Secondary | ICD-10-CM | POA: Diagnosis present

## 2015-09-06 DIAGNOSIS — Z841 Family history of disorders of kidney and ureter: Secondary | ICD-10-CM

## 2015-09-06 DIAGNOSIS — J440 Chronic obstructive pulmonary disease with acute lower respiratory infection: Principal | ICD-10-CM | POA: Diagnosis present

## 2015-09-06 DIAGNOSIS — F329 Major depressive disorder, single episode, unspecified: Secondary | ICD-10-CM | POA: Diagnosis present

## 2015-09-06 DIAGNOSIS — F419 Anxiety disorder, unspecified: Secondary | ICD-10-CM | POA: Diagnosis present

## 2015-09-06 DIAGNOSIS — J45909 Unspecified asthma, uncomplicated: Secondary | ICD-10-CM | POA: Diagnosis present

## 2015-09-06 LAB — CBC WITH DIFFERENTIAL/PLATELET
BASOS ABS: 0.1 10*3/uL (ref 0.0–0.1)
BASOS PCT: 1 %
EOS PCT: 6 %
Eosinophils Absolute: 0.4 10*3/uL (ref 0.0–0.7)
HEMATOCRIT: 43.8 % (ref 36.0–46.0)
Hemoglobin: 14.2 g/dL (ref 12.0–15.0)
LYMPHS PCT: 25 %
Lymphs Abs: 1.9 10*3/uL (ref 0.7–4.0)
MCH: 29.5 pg (ref 26.0–34.0)
MCHC: 32.4 g/dL (ref 30.0–36.0)
MCV: 90.9 fL (ref 78.0–100.0)
Monocytes Absolute: 0.5 10*3/uL (ref 0.1–1.0)
Monocytes Relative: 6 %
NEUTROS ABS: 4.7 10*3/uL (ref 1.7–7.7)
Neutrophils Relative %: 62 %
PLATELETS: 260 10*3/uL (ref 150–400)
RBC: 4.82 MIL/uL (ref 3.87–5.11)
RDW: 13.3 % (ref 11.5–15.5)
WBC: 7.5 10*3/uL (ref 4.0–10.5)

## 2015-09-06 LAB — BLOOD GAS, ARTERIAL
Acid-Base Excess: 1.2 mmol/L (ref 0.0–2.0)
BICARBONATE: 25.5 meq/L — AB (ref 20.0–24.0)
DRAWN BY: 103701
O2 CONTENT: 4 L/min
O2 SAT: 86.3 %
PATIENT TEMPERATURE: 98.6
PH ART: 7.409 (ref 7.350–7.450)
TCO2: 23 mmol/L (ref 0–100)
pCO2 arterial: 41.1 mmHg (ref 35.0–45.0)
pO2, Arterial: 53.8 mmHg — ABNORMAL LOW (ref 80.0–100.0)

## 2015-09-06 LAB — BASIC METABOLIC PANEL
ANION GAP: 8 (ref 5–15)
BUN: 16 mg/dL (ref 6–20)
CALCIUM: 9 mg/dL (ref 8.9–10.3)
CO2: 31 mmol/L (ref 22–32)
Chloride: 98 mmol/L — ABNORMAL LOW (ref 101–111)
Creatinine, Ser: 1.01 mg/dL — ABNORMAL HIGH (ref 0.44–1.00)
GFR, EST NON AFRICAN AMERICAN: 60 mL/min — AB (ref >60)
GLUCOSE: 98 mg/dL (ref 65–99)
POTASSIUM: 3.7 mmol/L (ref 3.5–5.1)
Sodium: 137 mmol/L (ref 135–145)

## 2015-09-06 LAB — I-STAT TROPONIN, ED: Troponin i, poc: 0.01 ng/mL (ref 0.00–0.08)

## 2015-09-06 MED ORDER — HYDROCHLOROTHIAZIDE 25 MG PO TABS
25.0000 mg | ORAL_TABLET | Freq: Every day | ORAL | Status: DC
  Administered 2015-09-07 – 2015-09-09 (×3): 25 mg via ORAL
  Filled 2015-09-06 (×3): qty 1

## 2015-09-06 MED ORDER — ALBUTEROL SULFATE (2.5 MG/3ML) 0.083% IN NEBU: 5.0000 mg | INHALATION_SOLUTION | RESPIRATORY_TRACT | Status: AC | PRN

## 2015-09-06 MED ORDER — ALBUTEROL SULFATE (2.5 MG/3ML) 0.083% IN NEBU
2.5000 mg | INHALATION_SOLUTION | RESPIRATORY_TRACT | Status: DC | PRN
Start: 2015-09-06 — End: 2015-09-09
  Administered 2015-09-09: 2.5 mg via RESPIRATORY_TRACT
  Filled 2015-09-06: qty 3

## 2015-09-06 MED ORDER — HYDROCOD POLST-CPM POLST ER 10-8 MG/5ML PO SUER
5.0000 mL | Freq: Once | ORAL | Status: AC
  Administered 2015-09-06: 5 mL via ORAL
  Filled 2015-09-06: qty 5

## 2015-09-06 MED ORDER — METHYLPREDNISOLONE SODIUM SUCC 125 MG IJ SOLR
125.0000 mg | Freq: Once | INTRAMUSCULAR | Status: AC
  Administered 2015-09-06: 125 mg via INTRAVENOUS
  Filled 2015-09-06: qty 2

## 2015-09-06 MED ORDER — SODIUM CHLORIDE 0.9 % IV SOLN: 250.0000 mL | INTRAVENOUS | Status: DC | PRN

## 2015-09-06 MED ORDER — HYDROCODONE-ACETAMINOPHEN 5-325 MG PO TABS: 1.0000 | ORAL_TABLET | ORAL | Status: DC | PRN

## 2015-09-06 MED ORDER — ALBUTEROL SULFATE (2.5 MG/3ML) 0.083% IN NEBU
5.0000 mg | INHALATION_SOLUTION | Freq: Once | RESPIRATORY_TRACT | Status: AC
  Administered 2015-09-06: 5 mg via RESPIRATORY_TRACT
  Filled 2015-09-06: qty 6

## 2015-09-06 MED ORDER — HEPARIN SODIUM (PORCINE) 5000 UNIT/ML IJ SOLN
5000.0000 [IU] | Freq: Three times a day (TID) | INTRAMUSCULAR | Status: DC
  Administered 2015-09-06 – 2015-09-09 (×7): 5000 [IU] via SUBCUTANEOUS
  Filled 2015-09-06 (×7): qty 1

## 2015-09-06 MED ORDER — METHYLPREDNISOLONE SODIUM SUCC 125 MG IJ SOLR
60.0000 mg | Freq: Three times a day (TID) | INTRAMUSCULAR | Status: DC
  Administered 2015-09-06 – 2015-09-08 (×5): 60 mg via INTRAVENOUS
  Filled 2015-09-06 (×5): qty 2

## 2015-09-06 MED ORDER — TIOTROPIUM BROMIDE MONOHYDRATE 18 MCG IN CAPS
18.0000 ug | ORAL_CAPSULE | Freq: Every day | RESPIRATORY_TRACT | Status: DC
  Administered 2015-09-07 – 2015-09-09 (×3): 18 ug via RESPIRATORY_TRACT
  Filled 2015-09-06: qty 5

## 2015-09-06 MED ORDER — SODIUM CHLORIDE 0.9 % IJ SOLN
3.0000 mL | Freq: Two times a day (BID) | INTRAMUSCULAR | Status: DC
Start: 2015-09-06 — End: 2015-09-09
  Administered 2015-09-07 – 2015-09-09 (×3): 3 mL via INTRAVENOUS

## 2015-09-06 MED ORDER — IPRATROPIUM-ALBUTEROL 0.5-2.5 (3) MG/3ML IN SOLN
3.0000 mL | Freq: Once | RESPIRATORY_TRACT | Status: AC
  Administered 2015-09-06: 3 mL via RESPIRATORY_TRACT
  Filled 2015-09-06: qty 3

## 2015-09-06 MED ORDER — NICOTINE 7 MG/24HR TD PT24
7.0000 mg | MEDICATED_PATCH | Freq: Every day | TRANSDERMAL | Status: DC
  Administered 2015-09-06 – 2015-09-09 (×4): 7 mg via TRANSDERMAL
  Filled 2015-09-06 (×4): qty 1

## 2015-09-06 MED ORDER — TIOTROPIUM BROMIDE MONOHYDRATE 18 MCG IN CAPS
18.0000 ug | ORAL_CAPSULE | Freq: Every day | RESPIRATORY_TRACT | Status: DC
  Filled 2015-09-06: qty 5

## 2015-09-06 MED ORDER — CHLORHEXIDINE GLUCONATE 0.12 % MT SOLN
15.0000 mL | Freq: Two times a day (BID) | OROMUCOSAL | Status: DC
  Administered 2015-09-06 – 2015-09-09 (×6): 15 mL via OROMUCOSAL
  Filled 2015-09-06 (×6): qty 15

## 2015-09-06 MED ORDER — SODIUM CHLORIDE 0.9 % IV BOLUS (SEPSIS)
1000.0000 mL | Freq: Once | INTRAVENOUS | Status: AC
  Administered 2015-09-06: 1000 mL via INTRAVENOUS

## 2015-09-06 MED ORDER — CETYLPYRIDINIUM CHLORIDE 0.05 % MT LIQD
7.0000 mL | Freq: Two times a day (BID) | OROMUCOSAL | Status: DC
  Administered 2015-09-07: 7 mL via OROMUCOSAL

## 2015-09-06 MED ORDER — MAGNESIUM SULFATE 2 GM/50ML IV SOLN
2.0000 g | Freq: Once | INTRAVENOUS | Status: AC
  Administered 2015-09-06: 2 g via INTRAVENOUS
  Filled 2015-09-06: qty 50

## 2015-09-06 MED ORDER — ALBUTEROL SULFATE (2.5 MG/3ML) 0.083% IN NEBU
2.5000 mg | INHALATION_SOLUTION | Freq: Four times a day (QID) | RESPIRATORY_TRACT | Status: DC
  Administered 2015-09-06 – 2015-09-08 (×9): 2.5 mg via RESPIRATORY_TRACT
  Filled 2015-09-06 (×9): qty 3

## 2015-09-06 MED ORDER — SODIUM CHLORIDE 0.9 % IJ SOLN: 3.0000 mL | INTRAMUSCULAR | Status: DC | PRN

## 2015-09-06 MED ORDER — GABAPENTIN 400 MG PO CAPS
1600.0000 mg | ORAL_CAPSULE | Freq: Every day | ORAL | Status: DC
  Administered 2015-09-06 – 2015-09-08 (×3): 1600 mg via ORAL
  Filled 2015-09-06 (×3): qty 4

## 2015-09-06 MED ORDER — SODIUM CHLORIDE 0.9 % IJ SOLN: 3.0000 mL | Freq: Two times a day (BID) | INTRAMUSCULAR | Status: DC

## 2015-09-06 MED ORDER — DOXYCYCLINE HYCLATE 100 MG IV SOLR
100.0000 mg | Freq: Two times a day (BID) | INTRAVENOUS | Status: DC
  Administered 2015-09-06 – 2015-09-09 (×6): 100 mg via INTRAVENOUS
  Filled 2015-09-06 (×8): qty 100

## 2015-09-06 MED ORDER — SODIUM CHLORIDE 0.9 % IV SOLN
INTRAVENOUS | Status: AC
  Administered 2015-09-06 – 2015-09-07 (×2): via INTRAVENOUS

## 2015-09-06 NOTE — H&P (Signed)
[facility] History and Physical  Pierrette Scheu ZOX:[id number] DOB: Oct 23, 1955 DOA: 09/06/2015  Referring physician: ED personnel PCP: Grayce Sessions, NP   Chief Complaint: sob  HPI: [name] is a 59 y.o. female  Patient reports having history of asthma, COPD, and continued tobacco use. Patient states that she has had shortness of breath since receiving a flu shot roughly one month ago. She has had multiple ED visits but continues to have difficulty breathing. Presented to the hospital given persistent shortness of breath and worsening dyspnea on exertion. Patient states she smokes 4-5 cigarettes a day. She denies any sick contacts fevers or chills. Eyes any hemoptysis. The problem started to get worse several days ago has been persistent and gradually getting worse. She is not certain of anything that makes it better. Activity makes her shortness of breath worse   Review of Systems:  Constitutional:  No weight loss, night sweats, Fevers, chills, fatigue.  HEENT:  No headaches, Difficulty swallowing,Tooth/dental problems,Sore throat,  No sneezing, itching, ear ache, nasal congestion, post nasal drip,  Cardio-vascular:  No chest pain, Orthopnea, PND, swelling in lower extremities, anasarca, dizziness, palpitations  GI:  No heartburn, indigestion, abdominal pain, nausea, vomiting, diarrhea, change in bowel habits, loss of appetite  Resp:  +shortness of breath with exertion or at rest. No excess mucus, no productive cough, + non-productive cough, No coughing up of blood.No change in color of mucus.+ wheezing.No chest wall deformity  Skin:  no rash or lesions.  GU:  no dysuria, change in color of urine, no urgency or frequency. No flank pain.  Musculoskeletal:  No joint pain or swelling. No decreased range of motion. No back pain.  Psych:  No change in mood or affect. No depression or anxiety. No memory loss.   Past Medical History  Diagnosis Date  . Hypertension     . Asthma   . Thyroid disease   . RLS (restless legs syndrome) 06/22/2015  . Nocturnal leg cramps 06/22/2015   Past Surgical History  Procedure Laterality Date  . Abdominal hysterectomy    . Appendectomy     Social History:  reports that she has been smoking Cigarettes.  She has been smoking about 0.25 packs per day. She has never used smokeless tobacco. She reports that she does not drink alcohol or use illicit drugs.  No Known Allergies  Family History  Problem Relation Age of Onset  . Kidney failure Mother   . Heart failure Mother   . Heart attack Father   . Heart attack Brother     Prior to Admission medications   Medication Sig Start Date End Date Taking? Authorizing Provider  albuterol (PROVENTIL HFA;VENTOLIN HFA) 108 (90 BASE) MCG/ACT inhaler Inhale 2 puffs into the lungs every 6 (six) hours as needed for wheezing or shortness of breath.   Yes Historical Provider, MD  albuterol (PROVENTIL) (5 MG/ML) 0.5% nebulizer solution Take 2.5 mg by nebulization every 6 (six) hours as needed for wheezing or shortness of breath.    Yes Historical Provider, MD  alum & mag hydroxide-simeth (MAALOX/MYLANTA) 200-200-20 MG/5ML suspension Take 30 mLs by mouth every 6 (six) hours as needed for indigestion or heartburn.   Yes Historical Provider, MD  baclofen (LIORESAL) 10 MG tablet Take 1 tablet (10 mg total) by mouth at bedtime. 06/22/15  Yes York Spaniel, MD  calcium carbonate (TUMS - DOSED IN MG ELEMENTAL CALCIUM) 500 MG chewable tablet Chew 4 tablets by mouth daily as needed for indigestion or heartburn.  Yes Historical Provider, MD  dextromethorphan (DELSYM) 30 MG/5ML liquid Take 30 mg by mouth as needed for cough.   Yes Historical Provider, MD  gabapentin (NEURONTIN) 400 MG capsule Take 1,600 mg by mouth at bedtime.  07/27/15  Yes Historical Provider, MD  hydrochlorothiazide (HYDRODIURIL) 25 MG tablet Take 25 mg by mouth daily. 09/03/15  Yes Historical Provider, MD   HYDROcodone-acetaminophen (NORCO/VICODIN) 5-325 MG tablet Take 1 tablet by mouth every 4 (four) hours as needed. 08/30/15  Yes Tatyana Kirichenko, PA-C  Multiple Vitamin (MULTIVITAMIN WITH MINERALS) TABS tablet Take 1 tablet by mouth daily.   Yes Historical Provider, MD  naproxen sodium (ANAPROX) 220 MG tablet Take 220 mg by mouth 2 (two) times daily as needed (pain/headaches).   Yes Historical Provider, MD  azithromycin (ZITHROMAX) 250 MG tablet Take 1 tablet (250 mg total) by mouth daily. Take first 2 tablets together, then 1 every day until finished. 08/15/15   Gilda Crease, MD  HYDROcodone-homatropine (HYCODAN) 5-1.5 MG/5ML syrup Take 5 mLs by mouth every 6 (six) hours as needed for cough. 08/15/15   Gilda Crease, MD  predniSONE (DELTASONE) 20 MG tablet Take 2 tablets (40 mg total) by mouth daily. 08/30/15   Jaynie Crumble, PA-C   Physical Exam: Filed Vitals:   09/06/15 1500 09/06/15 1603 09/06/15 1700 09/06/15 1730  BP: 134/75 143/81 117/64   Pulse:  85    Temp:      TempSrc:      Resp: Height:      Weight:      SpO2: 93% 92% 89% 93%    Wt Readings from Last 3 Encounters:  09/06/15 88.451 kg (195 lb)  06/22/15 89.359 kg (197 lb)  06/01/14 86.183 kg (190 lb)    General:  Appears calm and comfortable Eyes: PERRL, normal lids, irises & conjunctiva ENT: grossly normal hearing, lips & tongue Neck: no LAD, masses or thyromegaly Cardiovascular: RRR, no m/r/g. No LE edema. Respiratory: Mild expiratory wheeze, equal chest rise, nasal cannula in place, increased work of breathing Abdomen: soft, ntnd Skin: no rash or induration seen on limited exam Musculoskeletal: grossly normal tone BUE/BLE Psychiatric: grossly normal mood and affect, speech fluent and appropriate Neurologic: Answers questions appropriately, moves extremities equally           Labs on Admission:  Basic Metabolic Panel:  Recent Labs Lab 09/06/15 1525  NA 137  K 3.7  CL 98*   CO2 31  GLUCOSE 98  BUN 16  CREATININE 1.01*  CALCIUM 9.0   Liver Function Tests: No results for input(s): AST, ALT, ALKPHOS, BILITOT, PROT, ALBUMIN in the last 168 hours. No results for input(s): LIPASE, AMYLASE in the last 168 hours. No results for input(s): AMMONIA in the last 168 hours. CBC:  Recent Labs Lab 09/06/15 1525  WBC 7.5  NEUTROABS 4.7  HGB 14.2  HCT 43.8  MCV 90.9  PLT 260   Cardiac Enzymes: No results for input(s): CKTOTAL, CKMB, CKMBINDEX, TROPONINI in the last 168 hours.  BNP (last 3 results) No results for input(s): BNP in the last 8760 hours.  ProBNP (last 3 results) No results for input(s): PROBNP in the last 8760 hours.  CBG: No results for input(s): GLUCAP in the last 168 hours.  Radiological Exams on Admission: Dg Chest 2 View  09/06/2015  CLINICAL DATA:  Shortness of breath with productive cough since last night. History of asthma. EXAM: CHEST  2 VIEW COMPARISON:  Radiographs 08/29/2015.  CT 08/30/2015.  FINDINGS: The heart size and mediastinal contours are stable. There is progressive diffuse central airway thickening with mild streaky atelectasis at both lung bases. There is no consolidation, edema or pleural effusion. The bones appear unchanged. IMPRESSION: Progressive central airway thickening consistent with bronchitis with mild basilar atelectasis. No evidence of pneumonia. Electronically Signed   By: Carey BullocksWilliam  Veazey M.D.   On: 09/06/2015 12:11    EKG: Independently reviewed. Sinus rhythm with no ST elevation or depressions  Assessment/Plan Principal Problem:   COPD exacerbation (HCC) - Solu-Medrol, doxycycline, Atrovent, Spiriva - Monitor on telemetry - Supplemental oxygen as needed - Encourage tobacco cessation  Active Problems:   DEPRESSION/ANXIETY - Stable no new reported anxiety    HYPERTENSION, BENIGN ESSENTIAL - Stable currently we'll continue home medication regimen next a.m.    Acute respiratory failure with hypoxia  (HCC) - Provide supplemental oxygenation and medication for COPD exacerbation please see above. - Should condition deteriorated we'll plan on transitioning to stepdown unit for closer monitoring, however patient is DO NOT RESUSCITATE/DO NOT INTUBATE  Nicotine dependence -We'll place on nicotine patch low dose  Code Status: full DVT Prophylaxis: Heparin Family Communication: discussed with patient and daughter at bedside Disposition Plan: Telemetry  Time spent: > 55 minutes  Penny PiaVEGA, Devika Dragovich [facility] Pager [phone]

## 2015-09-06 NOTE — ED Notes (Signed)
Blood draw attempt unsuccessful 

## 2015-09-06 NOTE — ED Notes (Signed)
Pt in DG. 

## 2015-09-06 NOTE — Progress Notes (Signed)
Received pt from ED, alert and oriented, telemetry applied, oriented to unit, call light placed in reach

## 2015-09-06 NOTE — ED Notes (Signed)
Pt SP02 = 84 while ambulating in hall on RA HR=103 pt was very short of breath Rn made aware

## 2015-09-06 NOTE — ED Notes (Signed)
Pt complaint of SOB and productive cough onset last night; unrelieved by inhalers and neb treatments; hx of asthma.

## 2015-09-06 NOTE — ED Provider Notes (Signed)
CSN: [id number]     Arrival date & time 09/06/15  1128 History   First MD Initiated Contact with Patient 09/06/15 1458     Chief Complaint  Patient presents with  . Cough  . Shortness of Breath     (Consider location/radiation/quality/duration/timing/severity/associated sxs/prior Treatment) HPI [name] is a 59 y.o. female history of asthma, hypertension, presents to emergency department complaining of cough, shortness of breath, wheezing. Patient states she has now been sick for 3 weeks. Initially was treated with a Z-Pak, prednisone course which did not help. Patient was seen again a days ago and was given more prednisone, cough medication which she states did not help. Patient is doing breathing treatments at home, states she took a breathing treatment yesterday and used her inhaler twice throughout the day, and again did a breathing treatment today. Patient reports worsening shortness of breath since yesterday. She also reports pain in the right ribs with coughing. She states her cough is productive. Denies any nasal congestion, sore throat, fever or chills. Nothing making her symptoms better or worse.   Past Medical History  Diagnosis Date  . Hypertension   . Asthma   . Thyroid disease   . RLS (restless legs syndrome) 06/22/2015  . Nocturnal leg cramps 06/22/2015   Past Surgical History  Procedure Laterality Date  . Abdominal hysterectomy    . Appendectomy     Family History  Problem Relation Age of Onset  . Kidney failure Mother   . Heart failure Mother   . Heart attack Father   . Heart attack Brother    Social History  Substance Use Topics  . Smoking status: Current Every Day Smoker -- 0.25 packs/day    Types: Cigarettes  . Smokeless tobacco: Never Used  . Alcohol Use: No   OB History    No data available     Review of Systems  Constitutional: Negative for fever and chills.  Respiratory: Positive for cough, chest tightness, shortness of breath and wheezing.    Cardiovascular: Negative for chest pain, palpitations and leg swelling.  Gastrointestinal: Negative for nausea, vomiting, abdominal pain and diarrhea.  Genitourinary: Negative for dysuria, flank pain and pelvic pain.  Musculoskeletal: Negative for myalgias, arthralgias, neck pain and neck stiffness.  Skin: Negative for rash.  Neurological: Negative for dizziness, weakness and headaches.  All other systems reviewed and are negative.     Allergies  Review of patient's allergies indicates no known allergies.  Home Medications   Prior to Admission medications   Medication Sig Start Date End Date Taking? Authorizing Provider  albuterol (PROVENTIL HFA;VENTOLIN HFA) 108 (90 BASE) MCG/ACT inhaler Inhale 2 puffs into the lungs every 6 (six) hours as needed for wheezing or shortness of breath.   Yes Historical Provider, MD  albuterol (PROVENTIL) (5 MG/ML) 0.5% nebulizer solution Take 2.5 mg by nebulization every 6 (six) hours as needed for wheezing or shortness of breath.    Yes Historical Provider, MD  alum & mag hydroxide-simeth (MAALOX/MYLANTA) 200-200-20 MG/5ML suspension Take 30 mLs by mouth every 6 (six) hours as needed for indigestion or heartburn.   Yes Historical Provider, MD  baclofen (LIORESAL) 10 MG tablet Take 1 tablet (10 mg total) by mouth at bedtime. 06/22/15  Yes York Spanielharles K Willis, MD  calcium carbonate (TUMS - DOSED IN MG ELEMENTAL CALCIUM) 500 MG chewable tablet Chew 4 tablets by mouth daily as needed for indigestion or heartburn.   Yes Historical Provider, MD  dextromethorphan (DELSYM) 30 MG/5ML liquid Take 30  mg by mouth as needed for cough.   Yes Historical Provider, MD  gabapentin (NEURONTIN) 400 MG capsule Take 1,600 mg by mouth at bedtime.  07/27/15  Yes Historical Provider, MD  hydrochlorothiazide (HYDRODIURIL) 25 MG tablet Take 25 mg by mouth daily. 09/03/15  Yes Historical Provider, MD  HYDROcodone-acetaminophen (NORCO/VICODIN) 5-325 MG tablet Take 1 tablet by mouth  every 4 (four) hours as needed. 08/30/15  Yes Leathia Farnell, PA-C  Multiple Vitamin (MULTIVITAMIN WITH MINERALS) TABS tablet Take 1 tablet by mouth daily.   Yes Historical Provider, MD  naproxen sodium (ANAPROX) 220 MG tablet Take 220 mg by mouth 2 (two) times daily as needed (pain/headaches).   Yes Historical Provider, MD  azithromycin (ZITHROMAX) 250 MG tablet Take 1 tablet (250 mg total) by mouth daily. Take first 2 tablets together, then 1 every day until finished. 08/15/15   Gilda Crease, MD  HYDROcodone-homatropine (HYCODAN) 5-1.5 MG/5ML syrup Take 5 mLs by mouth every 6 (six) hours as needed for cough. 08/15/15   Gilda Crease, MD  predniSONE (DELTASONE) 20 MG tablet Take 2 tablets (40 mg total) by mouth daily. 08/30/15   Ashar Lewinski, PA-C   BP 136/74 mmHg  Pulse 83  Temp(Src) 98.2 F (36.8 C) (Oral)  Resp 16  Ht  (1.651 m)  Wt 88.451 kg  BMI 32.45 kg/m2  SpO2 93% Physical Exam  Constitutional: She is oriented to person, place, and time. She appears well-developed and well-nourished. No distress.  HENT:  Head: Normocephalic.  Eyes: Conjunctivae are normal.  Neck: Neck supple.  Cardiovascular: Normal rate, regular rhythm and normal heart sounds.   Pulmonary/Chest: Effort normal. No respiratory distress. She has wheezes. She has no rales.  Decreased air movement bilaterally. Expiratory wheezes in both lung fields.   Abdominal: Soft. Bowel sounds are normal. She exhibits no distension. There is no tenderness. There is no rebound.  Musculoskeletal: She exhibits no edema.  Neurological: She is alert and oriented to person, place, and time.  Skin: Skin is warm and dry.  Psychiatric: She has a normal mood and affect. Her behavior is normal.  Nursing note and vitals reviewed.   ED Course  Procedures (including critical care time) Labs Review Labs Reviewed  BASIC METABOLIC PANEL - Abnormal; Notable for the following:    Chloride 98 (*)     Creatinine, Ser 1.01 (*)    GFR calc non Af Amer 60 (*)    All other components within normal limits  BLOOD GAS, ARTERIAL - Abnormal; Notable for the following:    pO2, Arterial 53.8 (*)    Bicarbonate 25.5 (*)    All other components within normal limits  CBC WITH DIFFERENTIAL/PLATELET  BASIC METABOLIC PANEL  CBC  I-STAT TROPOININ, ED    Imaging Review Dg Chest 2 View  09/06/2015  CLINICAL DATA:  Shortness of breath with productive cough since last night. History of asthma. EXAM: CHEST  2 VIEW COMPARISON:  Radiographs 08/29/2015.  CT 08/30/2015. FINDINGS: The heart size and mediastinal contours are stable. There is progressive diffuse central airway thickening with mild streaky atelectasis at both lung bases. There is no consolidation, edema or pleural effusion. The bones appear unchanged. IMPRESSION: Progressive central airway thickening consistent with bronchitis with mild basilar atelectasis. No evidence of pneumonia. Electronically Signed   By: Carey Bullocks M.D.   On: 09/06/2015 12:11   I have personally reviewed and evaluated these images and lab results as part of my medical decision-making.   EKG Interpretation  Date/Time:  Monday September 06 2015 15:41:58 EST Ventricular Rate:  88 PR Interval:  139 QRS Duration: 76 QT Interval:  365 QTC Calculation: 442 R Axis:   100 Text Interpretation:  Sinus rhythm LAE, consider biatrial enlargement  Right axis deviation Confirmed by Lincoln Brigham [phone]) on 09/06/2015 4:15:09  PM      MDM   Final diagnoses:  COPD exacerbation (HCC)    Patient with cough, congestion, shortness of breath, right rib pain. Patient initially tachypnea, respiratory rate was noted to be 30, oxygen saturation 90% on room air at rest. She was placed on 2 L nasal cannula, oxygen saturation is around 93% on 2 L. Patient with expiratory wheezes and decreased air movement bilaterally. Will repeat chest x-ray and get labs. Will try duo nebs while in emergency  department. Patient was recently seen 8 days ago, had full workup including CT angiogram of the chest which was normal.  Patient continues to have wheezing and shortness of breath. Dropped oxygen saturation to 84 while ambulating. [facility] with [facility], they will admit the patient for further treatment. No elevation in WBC count, no fever, do not think she needs any more imaging given recent ct angio for same symptoms.   Filed Vitals:   09/06/15 1730 09/06/15 1814 09/06/15 2020 09/06/15 2101  BP:  116/71 97/66   Pulse:  89 91   Temp:  98.1 F (36.7 C) 97.8 F (36.6 C)   TempSrc:  Oral Oral   Resp:  21 20   Height:   (1.651 m)    Weight:  92.3 kg    SpO2: 93% 92% 92% 90%        Jaynie Crumble, PA-C 09/07/15 0119  Tilden Fossa, MD 09/07/15 [phone]

## 2015-09-07 DIAGNOSIS — F329 Major depressive disorder, single episode, unspecified: Secondary | ICD-10-CM | POA: Diagnosis present

## 2015-09-07 DIAGNOSIS — J209 Acute bronchitis, unspecified: Secondary | ICD-10-CM | POA: Diagnosis not present

## 2015-09-07 DIAGNOSIS — E079 Disorder of thyroid, unspecified: Secondary | ICD-10-CM | POA: Diagnosis present

## 2015-09-07 DIAGNOSIS — Z7952 Long term (current) use of systemic steroids: Secondary | ICD-10-CM | POA: Diagnosis not present

## 2015-09-07 DIAGNOSIS — Z79899 Other long term (current) drug therapy: Secondary | ICD-10-CM | POA: Diagnosis not present

## 2015-09-07 DIAGNOSIS — Z841 Family history of disorders of kidney and ureter: Secondary | ICD-10-CM | POA: Diagnosis not present

## 2015-09-07 DIAGNOSIS — R0602 Shortness of breath: Secondary | ICD-10-CM | POA: Diagnosis present

## 2015-09-07 DIAGNOSIS — F17210 Nicotine dependence, cigarettes, uncomplicated: Secondary | ICD-10-CM | POA: Diagnosis not present

## 2015-09-07 DIAGNOSIS — J9601 Acute respiratory failure with hypoxia: Secondary | ICD-10-CM | POA: Diagnosis not present

## 2015-09-07 DIAGNOSIS — J45909 Unspecified asthma, uncomplicated: Secondary | ICD-10-CM | POA: Diagnosis present

## 2015-09-07 DIAGNOSIS — I10 Essential (primary) hypertension: Secondary | ICD-10-CM | POA: Diagnosis not present

## 2015-09-07 DIAGNOSIS — Z66 Do not resuscitate: Secondary | ICD-10-CM | POA: Diagnosis present

## 2015-09-07 DIAGNOSIS — Z8249 Family history of ischemic heart disease and other diseases of the circulatory system: Secondary | ICD-10-CM | POA: Diagnosis not present

## 2015-09-07 DIAGNOSIS — F419 Anxiety disorder, unspecified: Secondary | ICD-10-CM | POA: Diagnosis present

## 2015-09-07 DIAGNOSIS — J441 Chronic obstructive pulmonary disease with (acute) exacerbation: Secondary | ICD-10-CM | POA: Diagnosis not present

## 2015-09-07 DIAGNOSIS — J440 Chronic obstructive pulmonary disease with acute lower respiratory infection: Secondary | ICD-10-CM | POA: Diagnosis present

## 2015-09-07 DIAGNOSIS — G2581 Restless legs syndrome: Secondary | ICD-10-CM | POA: Diagnosis present

## 2015-09-07 LAB — BASIC METABOLIC PANEL
ANION GAP: 11 (ref 5–15)
BUN: 20 mg/dL (ref 6–20)
CHLORIDE: 102 mmol/L (ref 101–111)
CO2: 22 mmol/L (ref 22–32)
CREATININE: 1.15 mg/dL — AB (ref 0.44–1.00)
Calcium: 8.4 mg/dL — ABNORMAL LOW (ref 8.9–10.3)
GFR calc Af Amer: 59 mL/min — ABNORMAL LOW (ref >60)
GFR calc non Af Amer: 51 mL/min — ABNORMAL LOW (ref >60)
Glucose, Bld: 228 mg/dL — ABNORMAL HIGH (ref 65–99)
POTASSIUM: 3.5 mmol/L (ref 3.5–5.1)
SODIUM: 135 mmol/L (ref 135–145)

## 2015-09-07 LAB — CBC
HCT: 38.2 % (ref 36.0–46.0)
Hemoglobin: 12.2 g/dL (ref 12.0–15.0)
MCH: 28.6 pg (ref 26.0–34.0)
MCHC: 31.9 g/dL (ref 30.0–36.0)
MCV: 89.7 fL (ref 78.0–100.0)
PLATELETS: 250 10*3/uL (ref 150–400)
RBC: 4.26 MIL/uL (ref 3.87–5.11)
RDW: 13.2 % (ref 11.5–15.5)
WBC: 7.3 10*3/uL (ref 4.0–10.5)

## 2015-09-07 MED ORDER — DOCUSATE SODIUM 100 MG PO CAPS
200.0000 mg | ORAL_CAPSULE | Freq: Every day | ORAL | Status: DC
  Administered 2015-09-07 – 2015-09-08 (×2): 200 mg via ORAL
  Filled 2015-09-07 (×2): qty 2

## 2015-09-07 NOTE — Progress Notes (Signed)
[facility] PROGRESS NOTE  [name] ZOX:[id number] DOB: 24-Apr-1956 DOA: 09/06/2015 PCP: Grayce Sessions, NP  Brief narrative: 59 year old with history of COPD and active smoker who presented with COPD exacerbation.  Assessment/Plan: Principal Problem:   COPD exacerbation (HCC) - Patient was still wheezing and requiring supplemental oxygenation. She is not on oxygen at home. We'll continue current regimen as patient reports much improvement.  Active Problems:  DEPRESSION/ANXIETY - Stable no new reported anxiety   HYPERTENSION, BENIGN ESSENTIAL - Stable currently we'll continue home medication regimen next a.m.   Acute respiratory failure with hypoxia (HCC) - Provide supplemental oxygenation and medication for COPD exacerbation. Wean to room air as tolerated - Should condition deteriorated we'll plan on transitioning to stepdown unit for closer monitoring, however patient is DO NOT RESUSCITATE/DO NOT INTUBATE  Nicotine dependence -Continue nicotine patch low dose   Code Status: Full Family Communication: Discussed directly with patient Disposition Plan: Pending improvement in condition   Consultants:  None  Procedures:  None  Antibiotics:  Doxycycline  HPI/Subjective: Pt has no new complaints. Feels better  Objective: Filed Vitals:   09/06/15 2020 09/07/15 0438  BP: 97/66 110/67  Pulse: 91 75  Temp: 97.8 F (36.6 C) 97.6 F (36.4 C)  Resp: 20 20    Intake/Output Summary (Last 24 hours) at 09/07/15 1504 Last data filed at 09/07/15 0800  Gross per 24 hour  Intake 3360.83 ml  Output      0 ml  Net 3360.83 ml   Filed Weights   09/06/15 1144 09/06/15 1814  Weight: 88.451 kg (195 lb) 92.3 kg (203 lb 7.8 oz)    Exam:   General:  Pt in nad, alert and awake  Cardiovascular: rrr, no mrg  Respiratory: exp wheezes BL, no rhales, equal chest rise, [place] in place, speaking in broken sentences still  Abdomen: soft, nt,  nd  Musculoskeletal: no cyanosis or clubbing   Data Reviewed: Basic Metabolic Panel:  Recent Labs Lab 09/06/15 1525 09/07/15 0545  NA 137 135  K 3.7 3.5  CL 98* 102  CO2 31 22  GLUCOSE 98 228*  BUN 16 20  CREATININE 1.01* 1.15*  CALCIUM 9.0 8.4*   Liver Function Tests: No results for input(s): AST, ALT, ALKPHOS, BILITOT, PROT, ALBUMIN in the last 168 hours. No results for input(s): LIPASE, AMYLASE in the last 168 hours. No results for input(s): AMMONIA in the last 168 hours. CBC:  Recent Labs Lab 09/06/15 1525 09/07/15 0545  WBC 7.5 7.3  NEUTROABS 4.7  --   HGB 14.2 12.2  HCT 43.8 38.2  MCV 90.9 89.7  PLT 260 250   Cardiac Enzymes: No results for input(s): CKTOTAL, CKMB, CKMBINDEX, TROPONINI in the last 168 hours. BNP (last 3 results) No results for input(s): BNP in the last 8760 hours.  ProBNP (last 3 results) No results for input(s): PROBNP in the last 8760 hours.  CBG: No results for input(s): GLUCAP in the last 168 hours.  No results found for this or any previous visit (from the past 240 hour(s)).   Studies: Dg Chest 2 View  09/06/2015  CLINICAL DATA:  Shortness of breath with productive cough since last night. History of asthma. EXAM: CHEST  2 VIEW COMPARISON:  Radiographs 08/29/2015.  CT 08/30/2015. FINDINGS: The heart size and mediastinal contours are stable. There is progressive diffuse central airway thickening with mild streaky atelectasis at both lung bases. There is no consolidation, edema or pleural effusion. The bones appear unchanged. IMPRESSION: Progressive central airway  thickening consistent with bronchitis with mild basilar atelectasis. No evidence of pneumonia. Electronically Signed   By: Carey BullocksWilliam  Veazey M.D.   On: 09/06/2015 12:11    Scheduled Meds: . [COMPLETED] sodium chloride   Intravenous STAT  . albuterol  2.5 mg Nebulization Q6H  . antiseptic oral rinse  7 mL Mouth Rinse q12n4p  . chlorhexidine  15 mL Mouth Rinse BID  .  doxycycline (VIBRAMYCIN) IV  100 mg Intravenous Q12H  . gabapentin  1,600 mg Oral QHS  . heparin  5,000 Units Subcutaneous 3 times per day  . hydrochlorothiazide  25 mg Oral Daily  . methylPREDNISolone (SOLU-MEDROL) injection  60 mg Intravenous 3 times per day  . nicotine  7 mg Transdermal Daily  . sodium chloride  3 mL Intravenous Q12H  . sodium chloride  3 mL Intravenous Q12H  . tiotropium  18 mcg Inhalation Daily   Continuous Infusions:   Time spent: > 35 minutes    [name], [name]  [facility] Pager [phone]. If 7PM-7AM, please contact night-coverage at [email or web address], password [facility] [email or web address] [email or web address] 09/07/2015, 3:04 PM  LOS: 1 day

## 2015-09-07 NOTE — Progress Notes (Signed)
Inpatient Diabetes Program Recommendations  AACE/ADA: New Consensus Statement on Inpatient Glycemic Control (2015)  Target Ranges:  Prepandial:   less than 140 mg/dL      Peak postprandial:   less than 180 mg/dL (1-2 hours)      Critically ill patients:  140 - 180 mg/dL    Results for [name], [name] (MRN [id number]) as of 09/07/2015 10:10  Ref. Range 09/07/2015 05:45  Glucose Latest Ref Range: 65-99 mg/dL 409228 (H)     Admit with: SOB  History: COPD     -Patient currently getting IV Solumedrol 60 mg tid.  Received 125 mg Iv Solumedrol X 1 dose yesterday at 4pm.  -Lab glucose elevated to 228 mg/dl this AM.    MD- Please consider checking CBGs for this patient and covering with Novolog Sensitive SSI (0-9 units) TID AC + HS if CBGs are elevated while patient getting IV Solumedrol     --Will follow patient during hospitalization--  Ambrose FinlandJeannine Johnston Toluwani Ruder RN, MSN, CDE Diabetes Coordinator Inpatient Glycemic Control Team Team Pager: [phone] (8a-5p)

## 2015-09-08 DIAGNOSIS — I10 Essential (primary) hypertension: Secondary | ICD-10-CM

## 2015-09-08 DIAGNOSIS — F17210 Nicotine dependence, cigarettes, uncomplicated: Secondary | ICD-10-CM

## 2015-09-08 DIAGNOSIS — J209 Acute bronchitis, unspecified: Secondary | ICD-10-CM | POA: Diagnosis present

## 2015-09-08 DIAGNOSIS — J9601 Acute respiratory failure with hypoxia: Secondary | ICD-10-CM

## 2015-09-08 LAB — GLUCOSE, CAPILLARY: GLUCOSE-CAPILLARY: 175 mg/dL — AB (ref 65–99)

## 2015-09-08 MED ORDER — METHYLPREDNISOLONE SODIUM SUCC 125 MG IJ SOLR
60.0000 mg | Freq: Two times a day (BID) | INTRAMUSCULAR | Status: DC
Start: 2015-09-08 — End: 2015-09-09
  Administered 2015-09-08 – 2015-09-09 (×2): 60 mg via INTRAVENOUS
  Filled 2015-09-08 (×2): qty 2

## 2015-09-08 MED ORDER — ALBUTEROL SULFATE (2.5 MG/3ML) 0.083% IN NEBU
2.5000 mg | INHALATION_SOLUTION | Freq: Four times a day (QID) | RESPIRATORY_TRACT | Status: DC
  Administered 2015-09-09: 2.5 mg via RESPIRATORY_TRACT
  Filled 2015-09-08: qty 3

## 2015-09-08 NOTE — Progress Notes (Signed)
SATURATION QUALIFICATIONS: (This note is used to comply with regulatory documentation for home oxygen)  Patient Saturations on Room Air at Rest = 90%  Patient Saturations on Room Air while Ambulating = 87-88%  Patient Saturations on 2 Liters of oxygen while Ambulating = 91-93%  Please briefly explain why patient needs home oxygen:

## 2015-09-08 NOTE — Progress Notes (Signed)
Progress Note   Kim OchsVivian Feeser JXB:[id number] DOB: [birth year] DOA: 09/06/2015 PCP: Grayce SessionsEDWARDS, MICHELLE P, NP   Brief Narrative:   [name] is an 59 y.o. female the PMH of COPD/asthma, ongoing tobacco abuse who was admitted 09/06/15 the chief complaint of worsening shortness of breath.  Assessment/Plan:   Principal Problem:   Acute respiratory failure with hypoxia secondary to COPD exacerbation (HCC)/bronchitis - Treated with Solu-Medrol, Spiriva, bronchodilators and doxycycline.  Wean Solu-Medrol to 60 mg IV Q 12 hours. - Continue supplemental oxygen as needed. - Tobacco cessation counseling provided.  Active Problems:   DEPRESSION/ANXIETY - Stable.    HYPERTENSION, BENIGN ESSENTIAL - Blood pressure 150s-70s. Not on antihypertensives at baseline.    Nicotine dependence - Counseled. Nicotine patch provided.    DVT Prophylaxis - Heparin ordered.   Family Communication/Anticipated D/C date and plan/Code Status   Family Communication: No family at the bedside. Disposition Plan: Home when stable.  Lives alone. Anticipated D/C date:   1-2 more days. Code Status: DO NOT RESUSCITATE.   IV Access:    Peripheral IV   Procedures and diagnostic studies:   Dg Chest 2 View  09/06/2015  CLINICAL DATA:  Shortness of breath with productive cough since last night. History of asthma. EXAM: CHEST  2 VIEW COMPARISON:  Radiographs 08/29/2015.  CT 08/30/2015. FINDINGS: The heart size and mediastinal contours are stable. There is progressive diffuse central airway thickening with mild streaky atelectasis at both lung bases. There is no consolidation, edema or pleural effusion. The bones appear unchanged. IMPRESSION: Progressive central airway thickening consistent with bronchitis with mild basilar atelectasis. No evidence of pneumonia. Electronically Signed   By: Carey BullocksWilliam  Veazey M.D.   On: 09/06/2015 12:11     Medical Consultants:    None.  Anti-Infectives:     Doxycycline 09/06/15--->   Subjective:   Kim OchsVivian Cothran feels much better.  Cough and pleuritic chest pain has improved.  Appetite good.  Last BM was today.  Objective:    Filed Vitals:   09/07/15 2016 09/07/15 2033 09/08/15 0128 09/08/15 0527  BP:  152/73  152/81  Pulse:  98  91  Temp:  98.2 F (36.8 C)  98 F (36.7 C)  TempSrc:  Oral  Oral  Resp:  18  18  Height:      Weight:      SpO2: 92% 90% 91% 92%    Intake/Output Summary (Last 24 hours) at 09/08/15 0849 Last data filed at 09/07/15 1751  Gross per 24 hour  Intake    250 ml  Output      0 ml  Net    250 ml   Filed Weights   09/06/15 1144 09/06/15 1814  Weight: 88.451 kg (195 lb) 92.3 kg (203 lb 7.8 oz)    Exam: Gen:  NAD, sitting up in chair Cardiovascular:  RRR, No M/R/G Respiratory:  Lungs with expiratory wheezes. Gastrointestinal:  Abdomen soft, NT/ND, + BS Extremities:  No C/E/C   Data Reviewed:    Labs: Basic Metabolic Panel:  Recent Labs Lab 09/06/15 1525 09/07/15 0545  NA 137 135  K 3.7 3.5  CL 98* 102  CO2 31 22  GLUCOSE 98 228*  BUN 16 20  CREATININE 1.01* 1.15*  CALCIUM 9.0 8.4*   GFR Estimated Creatinine Clearance: 59.1 mL/min (by C-G formula based on Cr of 1.15).  CBC:  Recent Labs Lab 09/06/15 1525 09/07/15 0545  WBC 7.5 7.3  NEUTROABS 4.7  --   HGB 14.2  12.2  HCT 43.8 38.2  MCV 90.9 89.7  PLT 260 250   Microbiology No results found for this or any previous visit (from the past 240 hour(s)).   Medications:   . albuterol  2.5 mg Nebulization Q6H  . antiseptic oral rinse  7 mL Mouth Rinse q12n4p  . chlorhexidine  15 mL Mouth Rinse BID  . docusate sodium  200 mg Oral QHS  . doxycycline (VIBRAMYCIN) IV  100 mg Intravenous Q12H  . gabapentin  1,600 mg Oral QHS  . heparin  5,000 Units Subcutaneous 3 times per day  . hydrochlorothiazide  25 mg Oral Daily  . methylPREDNISolone (SOLU-MEDROL) injection  60 mg Intravenous 3 times per day  . nicotine  7 mg  Transdermal Daily  . sodium chloride  3 mL Intravenous Q12H  . tiotropium  18 mcg Inhalation Daily   Continuous Infusions:   Time spent: 25 minutes.   LOS: 2 days   RAMA,CHRISTINA  [facility] Pager [phone]. If unable to reach me by pager, please call my cell phone at [phone].  *Please refer to [email or web address], password [email or web address] to get updated schedule on who will round on this patient, as hospitalists switch teams weekly. If 7PM-7AM, please contact night-coverage at [email or web address], password [email or web address] for any overnight needs.  09/08/2015, 8:49 AM

## 2015-09-09 DIAGNOSIS — J209 Acute bronchitis, unspecified: Secondary | ICD-10-CM

## 2015-09-09 MED ORDER — DOXYCYCLINE HYCLATE 50 MG PO CAPS: 100.0000 mg | ORAL_CAPSULE | Freq: Two times a day (BID) | ORAL | Status: DC

## 2015-09-09 MED ORDER — TIOTROPIUM BROMIDE MONOHYDRATE 18 MCG IN CAPS: 18.0000 ug | ORAL_CAPSULE | Freq: Every day | RESPIRATORY_TRACT | Status: DC

## 2015-09-09 MED ORDER — PREDNISONE 10 MG (21) PO TBPK: ORAL_TABLET | ORAL | Status: DC

## 2015-09-09 NOTE — Progress Notes (Signed)
Pt given discharge, follow up and medication instructions, verbalized understanding, IV removed, family to transport home.

## 2015-09-09 NOTE — Progress Notes (Signed)
SATURATION QUALIFICATIONS: (This note is used to comply with regulatory documentation for home oxygen)  Patient Saturations on Room Air at Rest = 93%  Patient Saturations on Room Air while Ambulating = 89-92%

## 2015-09-09 NOTE — Discharge Instructions (Signed)

## 2015-09-09 NOTE — Discharge Summary (Signed)
Physician Discharge Summary  [name] WUJ:[id number] DOB: 06-25-1956 DOA: 09/06/2015  PCP: Grayce Sessions, NP  Admit date: 09/06/2015 Discharge date: 09/09/2015   Recommendations for Outpatient Follow-Up:   1. F/U with PCP in 1 week.   Discharge Diagnosis:   Principal Problem:    COPD exacerbation (HCC) Active Problems:    DEPRESSION/ANXIETY    HYPERTENSION, BENIGN ESSENTIAL    Acute respiratory failure with hypoxia (HCC)    Nicotine dependence    Acute bronchitis   Discharge disposition:  Home.  Discharge Condition: Improved.  Diet recommendation: Low sodium, heart healthy.     History of Present Illness:    [name] is an 59 y.o. female the PMH of COPD/asthma, ongoing tobacco abuse who was admitted 09/06/15 the chief complaint of worsening shortness of breath.   Hospital Course by Problem:   Principal Problem:  Acute respiratory failure with hypoxia secondary to COPD exacerbation (HCC)/bronchitis - Treated with Solu-Medrol, Spiriva, bronchodilators and doxycycline. D/C on prednisone taper. - Provided with supplemental oxygen as needed. No oxygen requirement at D/C. - Tobacco cessation counseling provided.  Active Problems:  DEPRESSION/ANXIETY - Stable.   HYPERTENSION, BENIGN ESSENTIAL - Blood pressure 150s-70s. Not on antihypertensives at baseline.   Nicotine dependence - Counseled. Nicotine patch provided.  Medical Consultants:    None.   Discharge Exam:   Filed Vitals:   09/08/15 2117 09/09/15 0629  BP: 138/79 136/88  Pulse: 76 69  Temp: 97.7 F (36.5 C) 97.6 F (36.4 C)  Resp: 18 18   Filed Vitals:   09/08/15 2006 09/08/15 2117 09/08/15 2300 09/09/15 0629  BP:  138/79  136/88  Pulse:  76  69  Temp:  97.7 F (36.5 C)  97.6 F (36.4 C)  TempSrc:  Oral  Oral  Resp:  18  18  Height:      Weight:      SpO2: 99% 95% 95% 95%   Gen: NAD, sitting up in chair Cardiovascular: RRR, No M/R/G Respiratory:  Lungs with expiratory wheezes. Gastrointestinal: Abdomen soft, NT/ND, + BS Extremities: No C/E/C   The results of significant diagnostics from this hospitalization (including imaging, microbiology, ancillary and laboratory) are listed below for reference.     Procedures and Diagnostic Studies:   Dg Chest 2 View  09/06/2015  CLINICAL DATA:  Shortness of breath with productive cough since last night. History of asthma. EXAM: CHEST  2 VIEW COMPARISON:  Radiographs 08/29/2015.  CT 08/30/2015. FINDINGS: The heart size and mediastinal contours are stable. There is progressive diffuse central airway thickening with mild streaky atelectasis at both lung bases. There is no consolidation, edema or pleural effusion. The bones appear unchanged. IMPRESSION: Progressive central airway thickening consistent with bronchitis with mild basilar atelectasis. No evidence of pneumonia. Electronically Signed   By: Carey Bullocks M.D.   On: 09/06/2015 12:11     Labs:   Basic Metabolic Panel:  Recent Labs Lab 09/06/15 1525 09/07/15 0545  NA 137 135  K 3.7 3.5  CL 98* 102  CO2 31 22  GLUCOSE 98 228*  BUN 16 20  CREATININE 1.01* 1.15*  CALCIUM 9.0 8.4*   GFR Estimated Creatinine Clearance: 59.1 mL/min (by C-G formula based on Cr of 1.15).  CBC:  Recent Labs Lab 09/06/15 1525 09/07/15 0545  WBC 7.5 7.3  NEUTROABS 4.7  --   HGB 14.2 12.2  HCT 43.8 38.2  MCV 90.9 89.7  PLT 260 250   CBG:  Recent Labs Lab 09/08/15 2122  GLUCAP 175*    Discharge Instructions:   Discharge Instructions    Call MD for:  difficulty breathing, headache or visual disturbances    Complete by:  As directed      Call MD for:  extreme fatigue    Complete by:  As directed      Call MD for:  temperature >100.4    Complete by:  As directed      Diet - low sodium heart healthy    Complete by:  As directed      Increase activity slowly    Complete by:  As directed      Other Restrictions    Complete by:  As  directed   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  If they are sick or you still are short of breath, wait until 09/20/15 to return to work.            Medication List    STOP taking these medications        azithromycin 250 MG tablet  Commonly known as:  ZITHROMAX     predniSONE 20 MG tablet  Commonly known as:  DELTASONE  Replaced by:  predniSONE 10 MG (21) Tbpk tablet      TAKE these medications        albuterol (5 MG/ML) 0.5% nebulizer solution  Commonly known as:  PROVENTIL  Take 2.5 mg by nebulization every 6 (six) hours as needed for wheezing or shortness of breath.     albuterol 108 (90 Base) MCG/ACT inhaler  Commonly known as:  PROVENTIL HFA;VENTOLIN HFA  Inhale 2 puffs into the lungs every 6 (six) hours as needed for wheezing or shortness of breath.     alum & mag hydroxide-simeth 200-200-20 MG/5ML suspension  Commonly known as:  MAALOX/MYLANTA  Take 30 mLs by mouth every 6 (six) hours as needed for indigestion or heartburn.     baclofen 10 MG tablet  Commonly known as:  LIORESAL  Take 1 tablet (10 mg total) by mouth at bedtime.     calcium carbonate 500 MG chewable tablet  Commonly known as:  TUMS - dosed in mg elemental calcium  Chew 4 tablets by mouth daily as needed for indigestion or heartburn.     DELSYM 30 MG/5ML liquid  Generic drug:  dextromethorphan  Take 30 mg by mouth as needed for cough.     doxycycline 50 MG capsule  Commonly known as:  VIBRAMYCIN  Take 2 capsules (100 mg total) by mouth 2 (two) times daily.     gabapentin 400 MG capsule  Commonly known as:  NEURONTIN  Take 1,600 mg by mouth at bedtime.     hydrochlorothiazide 25 MG tablet  Commonly known as:  HYDRODIURIL  Take 25 mg by mouth daily.     HYDROcodone-acetaminophen 5-325 MG tablet  Commonly known as:  NORCO/VICODIN  Take 1 tablet by mouth every 4 (four) hours as needed.     HYDROcodone-homatropine 5-1.5  MG/5ML syrup  Commonly known as:  HYCODAN  Take 5 mLs by mouth every 6 (six) hours as needed for cough.     multivitamin with minerals Tabs tablet  Take 1 tablet by mouth daily.     naproxen sodium 220 MG tablet  Commonly known as:  ANAPROX  Take 220 mg by mouth 2 (two) times daily as needed (pain/headaches).     predniSONE 10 MG (21)  Tbpk tablet  Commonly known as:  STERAPRED UNI-PAK 21 TAB  Take as directed.     tiotropium 18 MCG inhalation capsule  Commonly known as:  SPIRIVA  Place 1 capsule (18 mcg total) into inhaler and inhale daily.           Follow-up Information    Follow up with EDWARDS, MICHELLE P, NP. Schedule an appointment as soon as possible for a visit in 1 week.   Specialty:  Internal Medicine   Why:  Hospital follow up.   Contact information:   [address] [place] [address] [phone]        Time coordinating discharge: 25 minutes.  Signed:  RAMA,CHRISTINA  Pager [phone] [facility] 09/09/2015, 2:14 PM

## 2015-12-28 ENCOUNTER — Ambulatory Visit: Payer: No Typology Code available for payment source | Admitting: Neurology

## 2016-02-23 ENCOUNTER — Other Ambulatory Visit: Payer: Self-pay | Admitting: Neurology

## 2016-03-02 ENCOUNTER — Observation Stay (HOSPITAL_COMMUNITY)
Admission: EM | Admit: 2016-03-02 | Discharge: 2016-03-03 | Disposition: A | Discharge status: Home / Self Care | Payer: Self-pay | Attending: Family Medicine | Admitting: Family Medicine

## 2016-03-02 ENCOUNTER — Other Ambulatory Visit: Payer: Self-pay

## 2016-03-02 ENCOUNTER — Emergency Department (HOSPITAL_COMMUNITY): Payer: Self-pay

## 2016-03-02 ENCOUNTER — Encounter (HOSPITAL_COMMUNITY): Payer: Self-pay

## 2016-03-02 DIAGNOSIS — G4734 Idiopathic sleep related nonobstructive alveolar hypoventilation: Secondary | ICD-10-CM | POA: Diagnosis present

## 2016-03-02 DIAGNOSIS — F17210 Nicotine dependence, cigarettes, uncomplicated: Secondary | ICD-10-CM | POA: Insufficient documentation

## 2016-03-02 DIAGNOSIS — R0902 Hypoxemia: Secondary | ICD-10-CM

## 2016-03-02 DIAGNOSIS — G2581 Restless legs syndrome: Secondary | ICD-10-CM | POA: Insufficient documentation

## 2016-03-02 DIAGNOSIS — J449 Chronic obstructive pulmonary disease, unspecified: Secondary | ICD-10-CM | POA: Diagnosis present

## 2016-03-02 DIAGNOSIS — Z79899 Other long term (current) drug therapy: Secondary | ICD-10-CM | POA: Insufficient documentation

## 2016-03-02 DIAGNOSIS — I10 Essential (primary) hypertension: Secondary | ICD-10-CM | POA: Insufficient documentation

## 2016-03-02 DIAGNOSIS — J440 Chronic obstructive pulmonary disease with acute lower respiratory infection: Secondary | ICD-10-CM | POA: Insufficient documentation

## 2016-03-02 DIAGNOSIS — F17200 Nicotine dependence, unspecified, uncomplicated: Secondary | ICD-10-CM | POA: Diagnosis present

## 2016-03-02 DIAGNOSIS — J441 Chronic obstructive pulmonary disease with (acute) exacerbation: Secondary | ICD-10-CM | POA: Insufficient documentation

## 2016-03-02 DIAGNOSIS — J9601 Acute respiratory failure with hypoxia: Principal | ICD-10-CM | POA: Insufficient documentation

## 2016-03-02 DIAGNOSIS — J209 Acute bronchitis, unspecified: Secondary | ICD-10-CM | POA: Diagnosis present

## 2016-03-02 DIAGNOSIS — Z720 Tobacco use: Secondary | ICD-10-CM

## 2016-03-02 LAB — CBC WITH DIFFERENTIAL/PLATELET
Basophils Absolute: 0 10*3/uL (ref 0.0–0.1)
Basophils Relative: 1 %
EOS PCT: 5 %
Eosinophils Absolute: 0.3 10*3/uL (ref 0.0–0.7)
HEMATOCRIT: 41.1 % (ref 36.0–46.0)
Hemoglobin: 13.5 g/dL (ref 12.0–15.0)
LYMPHS ABS: 1.9 10*3/uL (ref 0.7–4.0)
LYMPHS PCT: 28 %
MCH: 29.1 pg (ref 26.0–34.0)
MCHC: 32.8 g/dL (ref 30.0–36.0)
MCV: 88.6 fL (ref 78.0–100.0)
Monocytes Absolute: 0.6 10*3/uL (ref 0.1–1.0)
Monocytes Relative: 9 %
NEUTROS ABS: 4 10*3/uL (ref 1.7–7.7)
Neutrophils Relative %: 57 %
PLATELETS: 272 10*3/uL (ref 150–400)
RBC: 4.64 MIL/uL (ref 3.87–5.11)
RDW: 13.7 % (ref 11.5–15.5)
WBC: 6.9 10*3/uL (ref 4.0–10.5)

## 2016-03-02 LAB — BLOOD GAS, ARTERIAL
Acid-Base Excess: 0.5 mmol/L (ref 0.0–2.0)
BICARBONATE: 24.6 meq/L — AB (ref 20.0–24.0)
DRAWN BY: 331471
O2 Content: 4 L/min
O2 Saturation: 85.2 %
PH ART: 7.409 (ref 7.350–7.450)
Patient temperature: 98.6
TCO2: 22.1 mmol/L (ref 0–100)
pCO2 arterial: 39.7 mmHg (ref 35.0–45.0)
pO2, Arterial: 50.3 mmHg — ABNORMAL LOW (ref 80.0–100.0)

## 2016-03-02 LAB — BASIC METABOLIC PANEL
Anion gap: 7 (ref 5–15)
BUN: 12 mg/dL (ref 6–20)
CHLORIDE: 105 mmol/L (ref 101–111)
CO2: 27 mmol/L (ref 22–32)
Calcium: 9 mg/dL (ref 8.9–10.3)
Creatinine, Ser: 0.94 mg/dL (ref 0.44–1.00)
GFR calc Af Amer: >60 mL/min (ref >60)
GFR calc non Af Amer: >60 mL/min (ref >60)
GLUCOSE: 101 mg/dL — AB (ref 65–99)
POTASSIUM: 3.6 mmol/L (ref 3.5–5.1)
SODIUM: 139 mmol/L (ref 135–145)

## 2016-03-02 LAB — I-STAT TROPONIN, ED: Troponin i, poc: 0 ng/mL (ref 0.00–0.08)

## 2016-03-02 MED ORDER — SODIUM CHLORIDE 0.9 % IV BOLUS (SEPSIS)
500.0000 mL | Freq: Once | INTRAVENOUS | Status: AC
  Administered 2016-03-02: 500 mL via INTRAVENOUS

## 2016-03-02 MED ORDER — ALBUTEROL (5 MG/ML) CONTINUOUS INHALATION SOLN
10.0000 mg/h | INHALATION_SOLUTION | Freq: Once | RESPIRATORY_TRACT | Status: AC
  Administered 2016-03-02: 10 mg/h via RESPIRATORY_TRACT

## 2016-03-02 MED ORDER — SODIUM CHLORIDE 0.9% FLUSH: 3.0000 mL | INTRAVENOUS | Status: DC | PRN

## 2016-03-02 MED ORDER — SODIUM CHLORIDE 0.9% FLUSH
3.0000 mL | Freq: Two times a day (BID) | INTRAVENOUS | Status: DC
  Administered 2016-03-02 – 2016-03-03 (×2): 3 mL via INTRAVENOUS

## 2016-03-02 MED ORDER — HYDROCHLOROTHIAZIDE 25 MG PO TABS
25.0000 mg | ORAL_TABLET | Freq: Every day | ORAL | Status: DC
  Administered 2016-03-02 – 2016-03-03 (×2): 25 mg via ORAL
  Filled 2016-03-02 (×2): qty 1

## 2016-03-02 MED ORDER — IPRATROPIUM-ALBUTEROL 0.5-2.5 (3) MG/3ML IN SOLN
3.0000 mL | Freq: Four times a day (QID) | RESPIRATORY_TRACT | Status: DC
  Administered 2016-03-02: 3 mL via RESPIRATORY_TRACT
  Filled 2016-03-02: qty 3

## 2016-03-02 MED ORDER — ALBUTEROL SULFATE (2.5 MG/3ML) 0.083% IN NEBU: 2.5000 mg | INHALATION_SOLUTION | RESPIRATORY_TRACT | Status: DC | PRN

## 2016-03-02 MED ORDER — GABAPENTIN 400 MG PO CAPS
1600.0000 mg | ORAL_CAPSULE | Freq: Every day | ORAL | Status: DC
  Administered 2016-03-02: 1600 mg via ORAL
  Filled 2016-03-02 (×2): qty 4

## 2016-03-02 MED ORDER — ACETAMINOPHEN 325 MG PO TABS: 650.0000 mg | ORAL_TABLET | Freq: Four times a day (QID) | ORAL | Status: DC | PRN

## 2016-03-02 MED ORDER — NICOTINE 21 MG/24HR TD PT24
21.0000 mg | MEDICATED_PATCH | Freq: Every day | TRANSDERMAL | Status: DC
  Administered 2016-03-02 – 2016-03-03 (×2): 21 mg via TRANSDERMAL
  Filled 2016-03-02 (×2): qty 1

## 2016-03-02 MED ORDER — ALBUTEROL SULFATE (2.5 MG/3ML) 0.083% IN NEBU
5.0000 mg | INHALATION_SOLUTION | Freq: Once | RESPIRATORY_TRACT | Status: AC
  Administered 2016-03-02: 5 mg via RESPIRATORY_TRACT
  Filled 2016-03-02: qty 6

## 2016-03-02 MED ORDER — ACETAMINOPHEN 650 MG RE SUPP: 650.0000 mg | Freq: Four times a day (QID) | RECTAL | Status: DC | PRN

## 2016-03-02 MED ORDER — BACLOFEN 10 MG PO TABS
10.0000 mg | ORAL_TABLET | Freq: Every day | ORAL | Status: DC
  Administered 2016-03-02: 10 mg via ORAL
  Filled 2016-03-02 (×2): qty 1

## 2016-03-02 MED ORDER — ALBUTEROL (5 MG/ML) CONTINUOUS INHALATION SOLN
15.0000 mg/h | INHALATION_SOLUTION | Freq: Once | RESPIRATORY_TRACT | Status: AC
  Administered 2016-03-02: 15 mg/h via RESPIRATORY_TRACT
  Filled 2016-03-02: qty 20

## 2016-03-02 MED ORDER — METHYLPREDNISOLONE SODIUM SUCC 40 MG IJ SOLR
40.0000 mg | Freq: Two times a day (BID) | INTRAMUSCULAR | Status: DC
  Administered 2016-03-02 – 2016-03-03 (×2): 40 mg via INTRAVENOUS
  Filled 2016-03-02 (×3): qty 1

## 2016-03-02 MED ORDER — METHYLPREDNISOLONE SODIUM SUCC 125 MG IJ SOLR
125.0000 mg | Freq: Once | INTRAMUSCULAR | Status: AC
  Administered 2016-03-02: 125 mg via INTRAVENOUS
  Filled 2016-03-02: qty 2

## 2016-03-02 MED ORDER — IPRATROPIUM-ALBUTEROL 0.5-2.5 (3) MG/3ML IN SOLN
3.0000 mL | Freq: Three times a day (TID) | RESPIRATORY_TRACT | Status: DC
  Administered 2016-03-03 (×2): 3 mL via RESPIRATORY_TRACT
  Filled 2016-03-02 (×2): qty 3

## 2016-03-02 MED ORDER — BENZONATATE 100 MG PO CAPS
200.0000 mg | ORAL_CAPSULE | Freq: Two times a day (BID) | ORAL | Status: DC | PRN
  Administered 2016-03-02: 200 mg via ORAL
  Filled 2016-03-02: qty 2

## 2016-03-02 MED ORDER — SODIUM CHLORIDE 0.9 % IV SOLN: 250.0000 mL | INTRAVENOUS | Status: DC | PRN

## 2016-03-02 MED ORDER — MAGNESIUM SULFATE 2 GM/50ML IV SOLN
2.0000 g | Freq: Once | INTRAVENOUS | Status: AC
  Administered 2016-03-02: 2 g via INTRAVENOUS
  Filled 2016-03-02: qty 50

## 2016-03-02 NOTE — Progress Notes (Signed)
Pt seen by [facility], stacy to assist with renewal of her orange card and to [facility] a resource to go get assist with filling her prescriptions  Confirms pcp is [facility] [place]

## 2016-03-02 NOTE — ED Notes (Signed)
Patient c/o SOB, expiratory wheezing x 3 days. Patient has a history of asthma.

## 2016-03-02 NOTE — ED Provider Notes (Signed)
CSN: [id number]     Arrival date & time 03/02/16  [id number] History   First MD Initiated Contact with Patient 03/02/16 1006     Chief Complaint  Patient presents with  . Shortness of Breath  . Wheezing     (Consider location/radiation/quality/duration/timing/severity/associated sxs/prior Treatment) HPI   [name] is a 60 y.o. female who presents for evaluation of shortness of breath present for 3 days, with cough productive of green sputum. She denies fever, chills, nausea, vomiting. She came here by private vehicle for evaluation. She uses a nebulizer and inhalers at home, but continues to smoke cigarettes. Last episode similar to this was 6 months ago. There are no other known modifying factors.   Past Medical History  Diagnosis Date  . Hypertension   . Asthma   . Thyroid disease   . RLS (restless legs syndrome) 06/22/2015  . Nocturnal leg cramps 06/22/2015   Past Surgical History  Procedure Laterality Date  . Abdominal hysterectomy    . Appendectomy     Family History  Problem Relation Age of Onset  . Kidney failure Mother   . Heart failure Mother   . Heart attack Father   . Heart attack Brother    Social History  Substance Use Topics  . Smoking status: Current Every Day Smoker -- 1.00 packs/day    Types: Cigarettes  . Smokeless tobacco: Never Used  . Alcohol Use: No   OB History    No data available     Review of Systems  All other systems reviewed and are negative.     Allergies  Review of patient's allergies indicates no known allergies.  Home Medications   Prior to Admission medications   Medication Sig Start Date End Date Taking? Authorizing Provider  albuterol (PROVENTIL HFA;VENTOLIN HFA) 108 (90 BASE) MCG/ACT inhaler Inhale 2 puffs into the lungs every 6 (six) hours as needed for wheezing or shortness of breath.   Yes Historical Provider, MD  baclofen (LIORESAL) 10 MG tablet Take 1 tablet (10 mg total) by mouth at bedtime. 06/22/15  Yes York Spanielharles K  Willis, MD  calcium carbonate (TUMS - DOSED IN MG ELEMENTAL CALCIUM) 500 MG chewable tablet Chew 4 tablets by mouth daily as needed for indigestion or heartburn.   Yes Historical Provider, MD  Dextromethorphan HBr (VICKS DAYQUIL COUGH) 15 MG/15ML LIQD Take 30 mLs by mouth once.   Yes Historical Provider, MD  gabapentin (NEURONTIN) 400 MG capsule Take 1,600 mg by mouth at bedtime.  07/27/15  Yes Historical Provider, MD  hydrochlorothiazide (HYDRODIURIL) 25 MG tablet Take 25 mg by mouth daily. 09/03/15  Yes Historical Provider, MD  Multiple Vitamin (MULTIVITAMIN WITH MINERALS) TABS tablet Take 1 tablet by mouth daily.   Yes Historical Provider, MD  albuterol (PROVENTIL) (5 MG/ML) 0.5% nebulizer solution Take 0.5 mLs (2.5 mg total) by nebulization every 6 (six) hours as needed for wheezing or shortness of breath. 03/03/16   Standley Brookinganiel P Goodrich, MD  nicotine (NICODERM CQ - DOSED IN MG/24 HOURS) 21 mg/24hr patch Place 1 patch (21 mg total) onto the skin daily. 03/03/16   Standley Brookinganiel P Goodrich, MD  predniSONE (DELTASONE) 10 MG tablet Take daily by mouth: 40 mg x3 days, then 20 mg x3 days, then 10 mg x3 days, then stop. 03/03/16   Standley Brookinganiel P Goodrich, MD  tiotropium (SPIRIVA) 18 MCG inhalation capsule Place 1 capsule (18 mcg total) into inhaler and inhale daily. 03/03/16   Standley Brookinganiel P Goodrich, MD   BP 130/72 mmHg  Pulse 80  Temp(Src) 97.5 F (36.4 C) (Oral)  Resp 21  Ht 5\' 3"  (1.6 m)  Wt 200 lb (90.719 kg)  BMI 35.44 kg/m2  SpO2 88% Physical Exam  Constitutional: She is oriented to person, place, and time. She appears well-developed and well-nourished.  HENT:  Head: Normocephalic and atraumatic.  Right Ear: External ear normal.  Left Ear: External ear normal.  Eyes: Conjunctivae and EOM are normal. Pupils are equal, round, and reactive to light.  Neck: Normal range of motion and phonation normal. Neck supple.  Cardiovascular: Normal rate, regular rhythm and normal heart sounds.   Pulmonary/Chest: Effort  normal. No respiratory distress. She exhibits no bony tenderness.  Decreased air movement bilaterally. With generalized wheezing, no rhonchi or rales.  Abdominal: Soft. There is no tenderness.  Musculoskeletal: Normal range of motion. She exhibits edema. She exhibits no tenderness.  Neurological: She is alert and oriented to person, place, and time. No cranial nerve deficit or sensory deficit. She exhibits normal muscle tone. Coordination normal.  Skin: Skin is warm, dry and intact.  Psychiatric: She has a normal mood and affect. Her behavior is normal. Judgment and thought content normal.  Nursing note and vitals reviewed.   ED Course  Procedures (including critical care time) Medications  hydrochlorothiazide (HYDRODIURIL) tablet 25 mg (25 mg Oral Given 03/03/16 1046)  gabapentin (NEURONTIN) capsule 1,600 mg (1,600 mg Oral Given 03/02/16 2134)  baclofen (LIORESAL) tablet 10 mg (10 mg Oral Given 03/02/16 2132)  sodium chloride flush (NS) 0.9 % injection 3 mL (3 mLs Intravenous Given 03/03/16 1000)  sodium chloride flush (NS) 0.9 % injection 3 mL (not administered)  0.9 %  sodium chloride infusion (not administered)  acetaminophen (TYLENOL) tablet 650 mg (not administered)    Or  acetaminophen (TYLENOL) suppository 650 mg (not administered)  nicotine (NICODERM CQ - dosed in mg/24 hours) patch 21 mg (21 mg Transdermal Patch Applied 03/03/16 1049)  methylPREDNISolone sodium succinate (SOLU-MEDROL) 40 mg/mL injection 40 mg (40 mg Intravenous Given 03/03/16 1046)  benzonatate (TESSALON) capsule 200 mg (200 mg Oral Given 03/02/16 2132)  albuterol (PROVENTIL) (2.5 MG/3ML) 0.083% nebulizer solution 2.5 mg (not administered)  ipratropium-albuterol (DUONEB) 0.5-2.5 (3) MG/3ML nebulizer solution 3 mL (3 mLs Nebulization Given 03/03/16 0815)  albuterol (PROVENTIL) (2.5 MG/3ML) 0.083% nebulizer solution 5 mg (5 mg Nebulization Given 03/02/16 1007)  albuterol (PROVENTIL,VENTOLIN) solution continuous neb (15  mg/hr Nebulization Given 03/02/16 1230)  methylPREDNISolone sodium succinate (SOLU-MEDROL) 125 mg/2 mL injection 125 mg (125 mg Intravenous Given 03/02/16 1241)  magnesium sulfate IVPB 2 g 50 mL (0 g Intravenous Stopped 03/02/16 1341)  sodium chloride 0.9 % bolus 500 mL (0 mLs Intravenous Stopped 03/02/16 1422)  albuterol (PROVENTIL,VENTOLIN) solution continuous neb (10 mg/hr Nebulization Given 03/02/16 1605)    Patient Vitals for the past 24 hrs:  BP Temp Temp src Pulse Resp SpO2  03/03/16 0836 - - - - - (!) 88 %  03/03/16 0816 - - - - - 94 %  03/03/16 0543 130/72 mmHg 97.5 F (36.4 C) Oral 80 (!) 21 95 %  03/02/16 2211 (!) 116/42 mmHg 98 F (36.7 C) Oral 90 (!) 21 95 %  03/02/16 2011 - - - - - 96 %  03/02/16 1806 (!) 105/56 mmHg 98.6 F (37 C) Oral (!) 101 - 93 %  03/02/16 1730 111/64 mmHg - - 104 22 94 %  03/02/16 1700 99/57 mmHg - - 105 22 93 %  03/02/16 1630 110/69 mmHg - - 113 25 96 %  03/02/16 1600 99/71 mmHg - - 110 26 93 %  03/02/16 1530 127/63 mmHg - - 106 (!) 33 95 %  03/02/16 1519 112/55 mmHg - - 110 26 98 %  03/02/16 1500 112/55 mmHg - - 119 (!) 27 97 %  03/02/16 1417 - - - - - 94 %  03/02/16 1330 126/66 mmHg - - (!) 131 - (!) 86 %   13:35- Reevaluation with update and discussion. After initial assessment and treatment, an updated evaluation reveals After treatments, she is sleepy, and had a period of hypoxia, to the 85% range. Given increased nasal cannula oxygen to 5 L with improvement above 90%. Additional labs ordered.Mancel Bale L    15:10- arterial blood gas indicates hypoxia despite oxygen supplementation. Patient not currently using oxygen at home. She'll require admission for further management and treatment. She is not currently in respiratory distress.  3:14 PM-Consult complete with Hospitalist. Patient case explained and discussed. She declines to admit patient for further evaluation and treatment. She would like the patient evaluated by critical care for  admission. Call ended at 15:27  15:55- case discussed with intensivist. He feels that the patient should be admitted by the hospitalist.  16:05- discussed with hospitalist, Dr. Irene Limbo, who will admit the patient.  Labs Review Labs Reviewed  BASIC METABOLIC PANEL - Abnormal; Notable for the following:    Glucose, Bld 101 (*)    All other components within normal limits  BLOOD GAS, ARTERIAL - Abnormal; Notable for the following:    pO2, Arterial 50.3 (*)    Bicarbonate 24.6 (*)    All other components within normal limits  CBC WITH DIFFERENTIAL/PLATELET  Rosezena Sensor, ED    Imaging Review Dg Chest 2 View  03/02/2016  CLINICAL DATA:  Cough EXAM: CHEST  2 VIEW COMPARISON:  09/06/2015 FINDINGS: Mild vascular congestion. Negative for edema or effusion. Negative for pneumonia. Heart size upper normal. IMPRESSION: Pulmonary vascular congestion without edema Electronically Signed   By: Marlan Palau M.D.   On: 03/02/2016 13:52   I have personally reviewed and evaluated these images and lab results as part of my medical decision-making.   EKG Interpretation   Date/Time:  Thursday March 02 2016 13:37:29 EDT Ventricular Rate:  120 PR Interval:    QRS Duration: 93 QT Interval:  330 QTC Calculation: 467 R Axis:   139 Text Interpretation:  Sinus tachycardia LAE, consider biatrial enlargement  Right axis deviation Minimal ST depression, diffuse leads Since last  tracing rate faster and ST abnormality is new Confirmed by Adabelle Griffiths  MD,  Rula Keniston ([id number]) on 03/02/2016 1:40:49 PM      MDM   Final diagnoses:  COPD exacerbation (HCC)  Hypoxia  Tobacco abuse    COPD exacerbation with hypoxia, but no acute respiratory distress. She will require admission to a telemetry unit. I doubt impending respiratory distress, failure. Doubt pneumonia, PE or ACS.  Nursing Notes Reviewed/ Care Coordinated, and agree without changes. Applicable Imaging Reviewed.  Interpretation of Laboratory Data  incorporated into ED treatment  Plan: Admit    Mancel Bale, MD 03/03/16 1328

## 2016-03-02 NOTE — H&P (Signed)
History and Physical  Kim OchsVivian Perez WJX:[id number] DOB: 17-Mar-1956 DOA: 03/02/2016  PCP: Grayce SessionsEDWARDS, MICHELLE P, NP  Patient coming from: home  Chief Complaint: short of breath  HPI:  60 year old woman, history of COPD, ongoing smoking, presented to the emergency department with increasing shortness of breath. Admitted for acute hypoxic respiratory failure and COPD exacerbation.  Smokes 1-2 packs per day. Started with cold symptoms 4-5 days ago. Developed progressive shortness of breath especially worse over the last 24 hours. A lot of coughing last night. No specific aggravating or alleviating factors. No systemic symptoms.  ED Course: Afebrile, tachycardic, hypoxic on room air, normotensive. Required nasal cannula. Per emergency department physician, never truly required high flow nasal cannula.  Pertinent labs: PH 7.41/39/50; BMP and CBC unremarkable. Troponin within normal limits. EKG: Independently reviewed. As below Imaging: independently reviewed. Pulmonary vascular congestion. No acute disease noted.  Review of Systems:  Negative for fever, visual changes, sore throat, rash, new muscle aches, dysuria, bleeding, n/v/abdominal pain.  Some chest pain from coughing  Past Medical History  Diagnosis Date  . Hypertension   . Asthma   . Thyroid disease   . RLS (restless legs syndrome) 06/22/2015  . Nocturnal leg cramps 06/22/2015    Past Surgical History  Procedure Laterality Date  . Abdominal hysterectomy    . Appendectomy       reports that she has been smoking Cigarettes.  She has been smoking about 1.00 pack per day. She has never used smokeless tobacco. She reports that she does not drink alcohol or use illicit drugs.   No Known Allergies  Family History  Problem Relation Age of Onset  . Kidney failure Mother   . Heart failure Mother   . Heart attack Father   . Heart attack Brother      Prior to Admission medications   Medication Sig Start Date End Date Taking?  Authorizing Provider  albuterol (PROVENTIL HFA;VENTOLIN HFA) 108 (90 BASE) MCG/ACT inhaler Inhale 2 puffs into the lungs every 6 (six) hours as needed for wheezing or shortness of breath.   Yes Historical Provider, MD  albuterol (PROVENTIL) (5 MG/ML) 0.5% nebulizer solution Take 2.5 mg by nebulization every 6 (six) hours as needed for wheezing or shortness of breath.    Yes Historical Provider, MD  baclofen (LIORESAL) 10 MG tablet Take 1 tablet (10 mg total) by mouth at bedtime. 06/22/15  Yes York Spanielharles K Willis, MD  calcium carbonate (TUMS - DOSED IN MG ELEMENTAL CALCIUM) 500 MG chewable tablet Chew 4 tablets by mouth daily as needed for indigestion or heartburn.   Yes Historical Provider, MD  Dextromethorphan HBr (VICKS DAYQUIL COUGH) 15 MG/15ML LIQD Take 30 mLs by mouth once.   Yes Historical Provider, MD  gabapentin (NEURONTIN) 400 MG capsule Take 1,600 mg by mouth at bedtime.  07/27/15  Yes Historical Provider, MD  hydrochlorothiazide (HYDRODIURIL) 25 MG tablet Take 25 mg by mouth daily. 09/03/15  Yes Historical Provider, MD  Multiple Vitamin (MULTIVITAMIN WITH MINERALS) TABS tablet Take 1 tablet by mouth daily.   Yes Historical Provider, MD  tiotropium (SPIRIVA) 18 MCG inhalation capsule Place 1 capsule (18 mcg total) into inhaler and inhale daily. Patient not taking: Reported on 03/02/2016 09/09/15   Maryruth Bunhristina P Rama, MD    Physical Exam: Filed Vitals:   03/02/16 1330 03/02/16 1417 03/02/16 1500 03/02/16 1519  BP: 126/66  112/55 112/55  Pulse: 131  119 110  Temp:      TempSrc:      Resp:  27 26  Height:      Weight:      SpO2: 86% 94% 97% 98%    Constitutional:  . Appears calm and comfortable, no acute distress Eyes:  . PERRL and irises appear normal . Normal conjunctivae and lids ENMT:  . nose appears normal . grossly normal hearing Neck:  . neck appears normal, no masses, normal ROM, supple . no thyromegaly Respiratory:  . Few wheezes. No rhonchi or rales. Marland Kitchen. Respiratory  effort mildly increased. Speaks in full sentences. No retractions or accessory muscle use Cardiovascular:  . RRR, no m/r/g . No LE extremity edema   . 2+ dorsalis pedal pulses bilaterally Abdomen:  . Abdomen appears normal; no tenderness or masses Musculoskeletal:  . Digits/nails hands: no clubbing, cyanosis, petechiae, infection . RUE, LUE, RLE, LLE   o strength and tone normal, no atrophy, no abnormal movements o No tenderness, masses Skin:  . No rashes, lesions, ulcers . palpation of skin: no induration or nodules Neurologic:  . Grossly normal Psychiatric:  . judgement and insight appear normal . Mental status o Mood, affect appropriate  Wt Readings from Last 3 Encounters:  03/02/16 90.719 kg (200 lb)  09/06/15 92.3 kg (203 lb 7.8 oz)  06/22/15 89.359 kg (197 lb)    I have personally reviewed following labs and imaging studies  Labs on Admission:  CBC:  Recent Labs Lab 03/02/16 1210  WBC 6.9  NEUTROABS 4.0  HGB 13.5  HCT 41.1  MCV 88.6  PLT 272   Basic Metabolic Panel:  Recent Labs Lab 03/02/16 1210  NA 139  K 3.6  CL 105  CO2 27  GLUCOSE 101*  BUN 12  CREATININE 0.94  CALCIUM 9.0   Urine analysis:    Component Value Date/Time   COLORURINE YELLOW 08/29/2015 1935   APPEARANCEUR CLEAR 08/29/2015 1935   LABSPEC 1.025 08/29/2015 1935   PHURINE 6.0 08/29/2015 1935   GLUCOSEU NEGATIVE 08/29/2015 1935   HGBUR NEGATIVE 08/29/2015 1935   HGBUR negative 03/25/2009 0943   BILIRUBINUR NEGATIVE 08/29/2015 1935   KETONESUR NEGATIVE 08/29/2015 1935   PROTEINUR NEGATIVE 08/29/2015 1935   UROBILINOGEN 0.2 03/09/2014 2122   NITRITE NEGATIVE 08/29/2015 1935   LEUKOCYTESUR NEGATIVE 08/29/2015 1935     Radiological Exams on Admission: Dg Chest 2 View  03/02/2016  CLINICAL DATA:  Cough EXAM: CHEST  2 VIEW COMPARISON:  09/06/2015 FINDINGS: Mild vascular congestion. Negative for edema or effusion. Negative for pneumonia. Heart size upper normal. IMPRESSION:  Pulmonary vascular congestion without edema Electronically Signed   By: Marlan Palauharles  Clark M.D.   On: 03/02/2016 13:52    EKG: Independently reviewed. Sinus tachycardia, no acute changes  Principal Problem:   Acute respiratory failure with hypoxia (HCC) Active Problems:   TOBACCO ABUSE   COPD exacerbation (HCC)   Acute bronchitis   Assessment/Plan 1. Acute hypoxemic respiratory failure secondary to COPD. PCO2 normal. No distress 2. COPD/asthma exacerbation, acute bronchitis 3. Tobacco use disorder   Observation to medical bed  Steroids, bronchodilators, supplemental oxygen (wean as tolerated)  I strongly encouraged smoking cessation  DVT prophylaxis:SCDs Code Status: full  Family Communication: daughter and friend atbedside Disposition Plan: obs    Time spent: 2850 minutes  Brendia Sacksaniel Elayjah Chaney, MD  [facility] Direct contact: [phone] --Via amion app OR  --[email or web address]; password [email or web address]  7PM-7AM contact night coverage as above  03/02/2016, 4:08 PM

## 2016-03-02 NOTE — Progress Notes (Signed)
Rt placed pt on HFNC due to ABG.

## 2016-03-03 MED ORDER — TIOTROPIUM BROMIDE MONOHYDRATE 18 MCG IN CAPS: 18.0000 ug | ORAL_CAPSULE | Freq: Every day | RESPIRATORY_TRACT | Status: DC

## 2016-03-03 MED ORDER — ALBUTEROL SULFATE (5 MG/ML) 0.5% IN NEBU: 2.5000 mg | INHALATION_SOLUTION | Freq: Four times a day (QID) | RESPIRATORY_TRACT | Status: DC | PRN

## 2016-03-03 MED ORDER — PREDNISONE 10 MG PO TABS: ORAL_TABLET | ORAL | Status: DC

## 2016-03-03 MED ORDER — NICOTINE 21 MG/24HR TD PT24: 21.0000 mg | MEDICATED_PATCH | Freq: Every day | TRANSDERMAL | Status: DC

## 2016-03-03 NOTE — Progress Notes (Signed)
  PROGRESS NOTE  Laureen OchsVivian Rydberg [id number] DOB: 07-09-1956 DOA: 03/02/2016 PCP: Grayce SessionsEDWARDS, MICHELLE P, NP  Brief Narrative: 60 year old woman, history of COPD, ongoing smoking, presented to the emergency department with increasing shortness of breath. Admitted for acute hypoxic respiratory failure and COPD exacerbation.  Assessment/Plan: 1. Acute hypoxemic respiratory failure secondary to COPD. PCO2 normal on admission. Still hypoxic today with ambulation. 2. COPD, asthma exacerbation, acute bronchitis. Clinically resolved. 3. Tobacco use disorder, 1-2 packs per day. Recommended smoking cessation. Patient has nicotine patches at home.   Doing well. Change to oral steroids and discharge home.  Has f/u with PCP next week. Suggest PFTs when improved. Consider referral to pulmonology.  Home oxygen 2 L [place]   Brendia Sacksaniel Menachem Urbanek, MD  [facility] Direct contact: [phone] --Via amion app OR  --[email or web address]; password [email or web address]  7PM-7AM contact night coverage as above 03/03/2016, 12:02 PM    Consultants:    Procedures:    Antimicrobials:    HPI/Subjective: Feeling better, no SOB. Some coughing.   Objective: Filed Vitals:   03/02/16 2211 03/03/16 0543 03/03/16 0816 03/03/16 0836  BP: 116/42 130/72    Pulse: 90 80    Temp: 98 F (36.7 C) 97.5 F (36.4 C)    TempSrc: Oral Oral    Resp: 21 21    Height:      Weight:      SpO2: 95% 95% 94% 88%    Intake/Output Summary (Last 24 hours) at 03/03/16 1202 Last data filed at 03/03/16 1000  Gross per 24 hour  Intake    743 ml  Output      0 ml  Net    743 ml     Filed Weights   03/02/16 1004  Weight: 90.719 kg (200 lb)    Exam:    Constitutional:  . Appears calm and comfortable Respiratory:  . Few wheezes, improved air movement, no rhonchi or rales . Respiratory effort normal. No retractions or accessory muscle use Cardiovascular:  . RRR, no m/r/g Psychiatric:  . judgement and insight appear  normal . Mental status o Mood, affect appropriate  I have personally reviewed following labs and imaging studies:  No new data  Scheduled Meds: . baclofen  10 mg Oral QHS  . gabapentin  1,600 mg Oral QHS  . hydrochlorothiazide  25 mg Oral Daily  . ipratropium-albuterol  3 mL Nebulization TID  . methylPREDNISolone (SOLU-MEDROL) injection  40 mg Intravenous Q12H  . nicotine  21 mg Transdermal Daily  . sodium chloride flush  3 mL Intravenous Q12H   Continuous Infusions:   Principal Problem:   Acute respiratory failure with hypoxia (HCC) Active Problems:   TOBACCO ABUSE   COPD exacerbation (HCC)   Acute bronchitis

## 2016-03-03 NOTE — Progress Notes (Addendum)
SATURATION QUALIFICATIONS: (This note is used to comply with regulatory documentation for home oxygen)  Patient Saturations on Room Air at Rest = 94%  Patient Saturations on Room Air while Ambulating = 88% briefly dropped to 87% for a couple of seconds while patient was walking/talking at the same time.  Patient Saturations on 2 Liters of oxygen while Ambulating = 90%  Please briefly explain why patient needs home oxygen:  Hypoxic during ambulation

## 2016-03-03 NOTE — Care Management Note (Signed)
Case Management Note  Patient Details  Name: [name] MRN: [id number] Date of Birth: Jun 05, 1956  Subjective/Objective: [facility] has delivered home 02 to rm, they will deliver additional 02 tanks, & concentrator to home-they will discuss different types of 02 tanks @ that time.Nsg notified.                   Action/Plan:d/c home.   Expected Discharge Date:   (unknown)               Expected Discharge Plan:  Home/Self Care  In-House Referral:     Discharge planning Services  CM Consult  Post Acute Care Choice:    Choice offered to:     DME Arranged:  Oxygen DME Agency:  [facility]  HH Arranged:    HH Agency:     Status of Service:  Completed, signed off  If discussed at Long Length of Stay Meetings, dates discussed:    Additional Comments:  Lanier ClamMahabir, Anyla Israelson, RN 03/03/2016, 3:15 PM

## 2016-03-03 NOTE — Discharge Summary (Signed)
Physician Discharge Summary  Laureen OchsVivian Napolitano ZOX:[id number] DOB: 1955-12-27 DOA: 03/02/2016  PCP: Grayce SessionsEDWARDS, MICHELLE P, NP  Admit date: 03/02/2016 Discharge date: 03/03/2016  Recommendations for Outpatient Follow-up:  1. Resolution of COPD exacerbation 2. Continue to encourage smoking cessation 3. Wean off home oxygen as able (started this hospitalization) 4. Consider outpatient PFTs    Follow-up Information    Follow up with EDWARDS, MICHELLE P, NP. Schedule an appointment as soon as possible for a visit in 1 week.   Specialty:  Internal Medicine   Contact information:   [address]  [place] [id number] [phone]      Discharge Diagnoses:  1. Acute hypoxemic respiratory failure secondary to COPD 2. COPD exacerbation, acute bronchitis 3. Tobacco use disorder  Discharge Condition: improved Disposition: home  Diet recommendation: regular  Filed Weights   03/02/16 1004  Weight: 90.719 kg (200 lb)    History of present illness:  60 year old woman, history of COPD, ongoing smoking, presented to the emergency department with increasing shortness of breath. Admitted for acute hypoxic respiratory failure and COPD exacerbation.  Hospital Course:  Treated with steroids, bronchodilators, supplemental oxygen with a rapid improvement. Hypoxic with ambulation, start home oxygen. Strongly encouraged smoking cessation. As outpatient follow-up with PCP next week. Hospitalization was uncomplicated, finish steroid taper as an outpatient.  Consultants:  None   Procedures:  None   Antimicrobials:  None  Discharge Instructions  Discharge Instructions    Activity as tolerated - No restrictions    Complete by:  As directed      Diet general    Complete by:  As directed      Discharge instructions    Complete by:  As directed   Call your physician or seek immediate medical attention for shortness of breath, wheezing, fatigue or worsening of condition. Please stop smoking!            Current Discharge Medication List    START taking these medications   Details  nicotine (NICODERM CQ - DOSED IN MG/24 HOURS) 21 mg/24hr patch Place 1 patch (21 mg total) onto the skin daily.    predniSONE (DELTASONE) 10 MG tablet Take daily by mouth: 40 mg x3 days, then 20 mg x3 days, then 10 mg x3 days, then stop. Qty: 21 tablet, Refills: 0      CONTINUE these medications which have CHANGED   Details  albuterol (PROVENTIL) (5 MG/ML) 0.5% nebulizer solution Take 0.5 mLs (2.5 mg total) by nebulization every 6 (six) hours as needed for wheezing or shortness of breath. Qty: 30 mL, Refills: 0    tiotropium (SPIRIVA) 18 MCG inhalation capsule Place 1 capsule (18 mcg total) into inhaler and inhale daily. Qty: 30 capsule, Refills: 1      CONTINUE these medications which have NOT CHANGED   Details  albuterol (PROVENTIL HFA;VENTOLIN HFA) 108 (90 BASE) MCG/ACT inhaler Inhale 2 puffs into the lungs every 6 (six) hours as needed for wheezing or shortness of breath.    baclofen (LIORESAL) 10 MG tablet Take 1 tablet (10 mg total) by mouth at bedtime. Qty: 30 each, Refills: 3    calcium carbonate (TUMS - DOSED IN MG ELEMENTAL CALCIUM) 500 MG chewable tablet Chew 4 tablets by mouth daily as needed for indigestion or heartburn.    Dextromethorphan HBr (VICKS DAYQUIL COUGH) 15 MG/15ML LIQD Take 30 mLs by mouth once.    gabapentin (NEURONTIN) 400 MG capsule Take 1,600 mg by mouth at bedtime.  Refills: 3    hydrochlorothiazide (  HYDRODIURIL) 25 MG tablet Take 25 mg by mouth daily.    Multiple Vitamin (MULTIVITAMIN WITH MINERALS) TABS tablet Take 1 tablet by mouth daily.       No Known Allergies  The results of significant diagnostics from this hospitalization (including imaging, microbiology, ancillary and laboratory) are listed below for reference.    Significant Diagnostic Studies: Dg Chest 2 View  03/02/2016  CLINICAL DATA:  Cough EXAM: CHEST  2 VIEW COMPARISON:  09/06/2015  FINDINGS: Mild vascular congestion. Negative for edema or effusion. Negative for pneumonia. Heart size upper normal. IMPRESSION: Pulmonary vascular congestion without edema Electronically Signed   By: Marlan Palauharles  Clark M.D.   On: 03/02/2016 13:52    Labs: Basic Metabolic Panel:  Recent Labs Lab 03/02/16 1210  NA 139  K 3.6  CL 105  CO2 27  GLUCOSE 101*  BUN 12  CREATININE 0.94  CALCIUM 9.0   CBC:  Recent Labs Lab 03/02/16 1210  WBC 6.9  NEUTROABS 4.0  HGB 13.5  HCT 41.1  MCV 88.6  PLT 272    Principal Problem:   Acute respiratory failure with hypoxia (HCC) Active Problems:   TOBACCO ABUSE   COPD exacerbation (HCC)   Acute bronchitis   Time coordinating discharge: 25 minutes  Signed:  Brendia Sacksaniel Goodrich, MD [facility] 03/03/2016, 1:47 PM

## 2016-11-30 ENCOUNTER — Other Ambulatory Visit: Payer: Self-pay | Admitting: Obstetrics and Gynecology

## 2016-11-30 DIAGNOSIS — J449 Chronic obstructive pulmonary disease, unspecified: Secondary | ICD-10-CM

## 2016-12-14 ENCOUNTER — Ambulatory Visit: Payer: Disability Insurance | Attending: Obstetrics and Gynecology

## 2016-12-14 DIAGNOSIS — J449 Chronic obstructive pulmonary disease, unspecified: Secondary | ICD-10-CM | POA: Diagnosis present

## 2016-12-14 MED ORDER — ALBUTEROL SULFATE (2.5 MG/3ML) 0.083% IN NEBU
2.5000 mg | INHALATION_SOLUTION | Freq: Once | RESPIRATORY_TRACT | Status: AC
  Administered 2016-12-14: 2.5 mg via RESPIRATORY_TRACT
  Filled 2016-12-14: qty 3

## 2017-05-09 ENCOUNTER — Telehealth: Payer: Self-pay | Admitting: Emergency Medicine

## 2017-05-09 ENCOUNTER — Ambulatory Visit (INDEPENDENT_AMBULATORY_CARE_PROVIDER_SITE_OTHER): Payer: Medicaid Other | Admitting: Emergency Medicine

## 2017-05-09 ENCOUNTER — Encounter: Payer: Self-pay | Admitting: Emergency Medicine

## 2017-05-09 VITALS — BP 118/62 | HR 54 | Ht 63.0 in | Wt 184.0 lb

## 2017-05-09 DIAGNOSIS — J9601 Acute respiratory failure with hypoxia: Secondary | ICD-10-CM

## 2017-05-09 DIAGNOSIS — Z23 Encounter for immunization: Secondary | ICD-10-CM

## 2017-05-09 DIAGNOSIS — J449 Chronic obstructive pulmonary disease, unspecified: Secondary | ICD-10-CM

## 2017-05-09 MED ORDER — FLUTICASONE-UMECLIDIN-VILANT 100-62.5-25 MCG/INH IN AEPB: 1.0000 | INHALATION_SPRAY | Freq: Every day | RESPIRATORY_TRACT | 0 refills | Status: DC

## 2017-05-09 NOTE — Assessment & Plan Note (Signed)
COPD with a minimal smoking history of long-standing asthma dating back to childhood. She has had pulmonary function testing during an evaluation for disability. I don't have access to these records now but I will obtain them. She has a history of documented exertional hypoxemia and we will confirm this today with walk, order ONO. Her albuterol use is frequent, only controller medication is Qvar. I would like to try her on Trelegy. If she benefits then will check to see if this is covered by her medicaid. If not then we will investivate ICS/LABA or LAMA. We will give Prevnar and flu shot today  Please stop Qvar for now We will try starting Trelegy 1 puff daily. Please rinse and gargle after using. If you benefit from this medication please call our office and let us know so that we can order through your pharmacy. If Trelegy is not covered by Medicaid then we can look for a similar alternative. Walking oximetry today did not show that you need to continue her oxygen with exertion. You may stop doing so for now. We will perform an overnight oximetry to see if you need to wear oxygen at night while sleeping. We will obtain a release of medical information form in order to get your pulmonary function testing results Flu shot today Prevnar 13 shot today Follow with Dr Delton Coombes next available to assess your status on the new medication.

## 2017-05-09 NOTE — Progress Notes (Signed)
Patient seen in the office today and instructed on use of Trelegy.  Patient expressed understanding and demonstrated technique. Emelia LoronCherina Tully Burgo [facility] 05/09/17

## 2017-05-09 NOTE — Assessment & Plan Note (Signed)
She did not desaturate on walking oximetry today. I have told her that she can stop using exertional oxygen for now. We will perform an overnight oximetry. If she desaturates on that test then she likely needs a formal sleep evaluation rule out obstructive sleep apnea.

## 2017-05-09 NOTE — Patient Instructions (Signed)
Please stop Qvar for now We will try starting Trelegy 1 puff daily. Please rinse and gargle after using. If you benefit from this medication please call our office and let us know so that we can order through your pharmacy. If Trelegy is not covered by Medicaid then we can look for a similar alternative. Walking oximetry today did not show that you need to continue her oxygen with exertion. You may stop doing so for now. We will perform an overnight oximetry to see if you need to wear oxygen at night while sleeping. We will obtain a release of medical information form in order to get your pulmonary function testing results Flu shot today Prevnar 13 shot today Follow with Dr Delton Coombes next available to assess your status on the new medication.

## 2017-05-09 NOTE — Telephone Encounter (Signed)
Spoke with pt, who states during she was calling to give the name of the office she had PFT previously. Pt states [facility] is the name. I have made Cherina aware verbally as pt singed a medical release during her OV today with RB. Nothing further needed.

## 2017-05-09 NOTE — Assessment & Plan Note (Signed)
She stopped proximal he one year ago. Discussed this and congratulated her.

## 2017-05-09 NOTE — Progress Notes (Signed)
Subjective:    Patient ID: [name], female    DOB: 10/09/1955, 61 y.o.   MRN: [id number]  HPI 61 year old former smoker (7 pack years) with a history of hypertension, restless leg syndrome, childhood asthma. Has been on BD's for asthma as an adult, most recently on QVAR and albuterol prn. COPD that was dx when she was admitted for a flare last year. She was started on O2 after that hospitalization. She has averaged about 3 flares a year for the last several years. She uses albuterol about 4-5 times a day. She is on 2L/min at all times. She has heavy tanks. She had PFT's in March and April when she was being evaluated for disability. She needs to be re-walked for O2.     Review of Systems  Constitutional: Negative for fever and unexpected weight change.  HENT: Negative for congestion, dental problem, ear pain, nosebleeds, postnasal drip, rhinorrhea, sinus pressure, sneezing, sore throat and trouble swallowing.   Eyes: Negative for redness and itching.  Respiratory: Positive for chest tightness and shortness of breath. Negative for cough and wheezing.   Cardiovascular: Negative for palpitations and leg swelling.  Gastrointestinal: Negative for nausea and vomiting.  Genitourinary: Negative for dysuria.  Musculoskeletal: Negative for joint swelling.  Skin: Negative for rash.  Neurological: Negative for headaches.  Hematological: Does not bruise/bleed easily.  Psychiatric/Behavioral: Negative for dysphoric mood. The patient is not nervous/anxious.     Past Medical History:  Diagnosis Date  . Asthma   . Hypertension   . Nocturnal leg cramps 06/22/2015  . RLS (restless legs syndrome) 06/22/2015  . Thyroid disease      Family History  Problem Relation Age of Onset  . Kidney failure Mother   . Heart failure Mother   . Heart attack Father   . Heart attack Brother   COPD in mother DM in several members No FHx lung cancer  Social History   Social History  . Marital status:  Legally Separated    Spouse name: N/A  . Number of children: 2  . 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 Smoking status: Former Smoker    Packs/day: 1.00    Types: Cigarettes    Quit date: 05/09/2016  . Smokeless tobacco: Never Used  . Alcohol use No  . Drug use: No  . Sexual activity: Not on file   Other Topics Concern  . Not on file   Social History Narrative   Patient drinks about 1 soda daily.   Patient is right handed.   she has worked in [employer], has worked in [employer] business.  [place] lived in [place]  No Known Allergies   Outpatient Medications Prior to Visit  Medication Sig Dispense Refill  . albuterol (PROVENTIL HFA;VENTOLIN HFA) 108 (90 BASE) MCG/ACT inhaler Inhale 2 puffs into the lungs every 6 (six) hours as needed for wheezing or shortness of breath.    Marland Kitchen. albuterol (PROVENTIL) (5 MG/ML) 0.5% nebulizer solution Take 0.5 mLs (2.5 mg total) by nebulization every 6 (six) hours as needed for wheezing or shortness of breath. 30 mL 0  . baclofen (LIORESAL) 10 MG tablet Take 1 tablet (10 mg total) by mouth at bedtime. 30 each 3  . calcium carbonate (TUMS - DOSED IN MG ELEMENTAL CALCIUM) 500 MG chewable tablet Chew 4 tablets by mouth daily as needed for indigestion or heartburn.    . gabapentin (  NEURONTIN) 400 MG capsule Take 1,600 mg by mouth at bedtime.   3  . hydrochlorothiazide (HYDRODIURIL) 25 MG tablet Take 25 mg by mouth daily.    . Multiple Vitamin (MULTIVITAMIN WITH MINERALS) TABS tablet Take 1 tablet by mouth daily.    Marland Kitchen Dextromethorphan HBr (VICKS DAYQUIL COUGH) 15 MG/15ML LIQD Take 30 mLs by mouth once.    . nicotine (NICODERM CQ - DOSED IN MG/24 HOURS) 21 mg/24hr patch Place 1 patch (21 mg total) onto the skin daily.    . predniSONE (DELTASONE) 10 MG tablet Take daily by mouth: 40 mg x3 days, then 20 mg x3 days, then 10 mg x3 days, then stop. 21 tablet 0  . tiotropium (SPIRIVA) 18 MCG inhalation  capsule Place 1 capsule (18 mcg total) into inhaler and inhale daily. 30 capsule 1   No facility-administered medications prior to visit.         Objective:   Physical Exam Vitals:   05/09/17 0943  BP: 118/62  Pulse: (!) 54  SpO2: 98%  Weight: 184 lb (83.5 kg)  Height: 5\' 3"  (1.6 m)   Gen: Pleasant, well-nourished, in no distress,  normal affect  ENT: No lesions,  mouth clear,  oropharynx clear w upper and lower dentures, no postnasal drip  Neck: No JVD, no stridor  Lungs: No use of accessory muscles, bilateral end-exp wheezes, no crackles.   Cardiovascular: RRR, heart sounds normal, no murmur or gallops, no peripheral edema  Musculoskeletal: No deformities, no cyanosis or clubbing  Neuro: alert, non focal  Skin: Warm, no lesions or rashes     Assessment & Plan:  COPD (chronic obstructive pulmonary disease) (HCC) COPD with a minimal smoking history of long-standing asthma dating back to childhood. She has had pulmonary function testing during an evaluation for disability. I don't have access to these records now but I will obtain them. She has a history of documented exertional hypoxemia and we will confirm this today with walk, order ONO. Her albuterol use is frequent, only controller medication is Qvar. I would like to try her on Trelegy. If she benefits then will check to see if this is covered by her medicaid. If not then we will investivate ICS/LABA or LAMA. We will give Prevnar and flu shot today  Please stop Qvar for now We will try starting Trelegy 1 puff daily. Please rinse and gargle after using. If you benefit from this medication please call our office and let us know so that we can order through your pharmacy. If Trelegy is not covered by Medicaid then we can look for a similar alternative. Walking oximetry today did not show that you need to continue her oxygen with exertion. You may stop doing so for now. We will perform an overnight oximetry to see if you need  to wear oxygen at night while sleeping. We will obtain a release of medical information form in order to get your pulmonary function testing results Flu shot today Prevnar 13 shot today Follow with Dr Delton Coombes next available to assess your status on the new medication.  TOBACCO ABUSE She stopped proximal he one year ago. Discussed this and congratulated her.  Acute respiratory failure with hypoxia (HCC) She did not desaturate on walking oximetry today. I have told her that she can stop using exertional oxygen for now. We will perform an overnight oximetry. If she desaturates on that test then she likely needs a formal sleep evaluation rule out obstructive sleep apnea.  Levy Pupa, MD, PhD  05/09/2017, 10:40 AM  [facility] [phone] or if no answer [phone]

## 2017-05-17 ENCOUNTER — Encounter: Payer: Self-pay | Admitting: Emergency Medicine

## 2017-06-28 ENCOUNTER — Encounter: Payer: Self-pay | Admitting: Emergency Medicine

## 2017-06-28 ENCOUNTER — Ambulatory Visit (INDEPENDENT_AMBULATORY_CARE_PROVIDER_SITE_OTHER): Payer: Medicaid Other | Admitting: Emergency Medicine

## 2017-06-28 VITALS — BP 128/80 | HR 60 | Ht 62.0 in | Wt 183.0 lb

## 2017-06-28 DIAGNOSIS — G4734 Idiopathic sleep related nonobstructive alveolar hypoventilation: Secondary | ICD-10-CM

## 2017-06-28 MED ORDER — FLUTICASONE-UMECLIDIN-VILANT 100-62.5-25 MCG/INH IN AEPB: 1.0000 | INHALATION_SPRAY | Freq: Every day | RESPIRATORY_TRACT | 5 refills | Status: DC

## 2017-06-28 NOTE — Assessment & Plan Note (Signed)
We will continue Trelegy, order this through your pharmacy. If the cost is high then we may decide to change to an alternative combination of inhalers. Follow with Dr Delton CoombesByrum in 3 months to review your sleep study or sooner if you have any problems.

## 2017-06-28 NOTE — Assessment & Plan Note (Signed)
No longer hypoxemic during the day but we did confirm nocturnal hypoxemia on her ONO on RA. I believe she needs a  Split night sleep study. Discussed potential treatment for obstructive sleep apnea should we confirm this

## 2017-06-28 NOTE — Progress Notes (Signed)
   Subjective:    Patient ID: [name], female    DOB: 09-Apr-1956, 61 y.o.   MRN: [id number]  HPI 61 year old former smoker (7 pack years) with a history of hypertension, restless leg syndrome, childhood asthma. Has been on BD's for asthma as an adult, most recently on QVAR and albuterol prn. COPD that was dx when she was admitted for a flare last year. She was started on O2 after that hospitalization. She has averaged about 3 flares a year for the last several years. She uses albuterol about 4-5 times a day. She is on 2L/min at all times. She has heavy tanks. She had PFT's in March and April when she was being evaluated for disability. She needs to be re-walked for O2.   ROV 06/28/17 -- This is a follow-up visit for COPD/fixed asthma, recent hypoxemic respiratory failure that appeared to have resolved on our initial visit 05/09/17. At her initial visit we did a trial of Trelegy, stopped QVAR, to see if she would benefit. She believes that it has helped significantly - better breathing. Activity has increased and benefited. No apparent side effects. She is using albuterol about every other day. She had an ONO that showed desats to as low as 81%.     Review of Systems  Constitutional: Negative for fever and unexpected weight change.  HENT: Negative for congestion, dental problem, ear pain, nosebleeds, postnasal drip, rhinorrhea, sinus pressure, sneezing, sore throat and trouble swallowing.   Eyes: Negative for redness and itching.  Respiratory: Positive for chest tightness and shortness of breath. Negative for cough and wheezing.   Cardiovascular: Negative for palpitations and leg swelling.  Gastrointestinal: Negative for nausea and vomiting.  Genitourinary: Negative for dysuria.  Musculoskeletal: Negative for joint swelling.  Skin: Negative for rash.  Neurological: Negative for headaches.  Hematological: Does not bruise/bleed easily.  Psychiatric/Behavioral: Negative for dysphoric mood.  The patient is not nervous/anxious.        Objective:   Physical Exam Vitals:   06/28/17 0958 06/28/17 0959  BP:  128/80  Pulse:  60  SpO2:  92%  Weight: 183 lb (83 kg)   Height: 5\' 2"  (1.575 m)    Gen: Pleasant, well-nourished, in no distress,  normal affect  ENT: No lesions,  mouth clear,  oropharynx clear w upper and lower dentures, no postnasal drip  Neck: No JVD, no stridor  Lungs: No use of accessory muscles, bilateral end-exp wheezes, no crackles.   Cardiovascular: RRR, heart sounds normal, no murmur or gallops, no peripheral edema  Musculoskeletal: No deformities, no cyanosis or clubbing  Neuro: alert, non focal  Skin: Warm, no lesions or rashes     Assessment & Plan:  Nocturnal hypoxemia No longer hypoxemic during the day but we did confirm nocturnal hypoxemia on her ONO on RA. I believe she needs a  Split night sleep study. Discussed potential treatment for obstructive sleep apnea should we confirm this  COPD (chronic obstructive pulmonary disease) (HCC) We will continue Trelegy, order this through your pharmacy. If the cost is high then we may decide to change to an alternative combination of inhalers. Follow with Dr Delton CoombesByrum in 3 months to review your sleep study or sooner if you have any problems.  Levy Pupaobert Clarita Mcelvain, MD, PhD 06/28/2017, 10:19 AM [facility] [phone] or if no answer [phone]

## 2017-06-28 NOTE — Patient Instructions (Signed)
We will continue Trelegy, order this through your pharmacy. If the cost is high then we may decide to change to an alternative combination of inhalers. We will perform a split night sleep study your oxygen level was low on your overnight oximetry test. Follow with Dr Delton CoombesByrum in 3 months to review your sleep study or sooner if you have any problems.

## 2017-07-06 ENCOUNTER — Telehealth: Payer: Self-pay

## 2017-07-06 NOTE — Telephone Encounter (Signed)
PA for Trelegy received from the pharmacy. Medicaid preferred medications are:  Flovent Qvar Advair  Dulera Symbicort Spiriva ' She has to try and fail 2 of these alternatives before insurance will pay for Trelegy. In her chart she has previously tried Symbicort 160 and Spiriva handihaler 18 mcg. Please advise.

## 2017-07-06 NOTE — Telephone Encounter (Signed)
She has failed Symbicort and Spiriva. That should qualify her

## 2017-08-14 ENCOUNTER — Ambulatory Visit (HOSPITAL_BASED_OUTPATIENT_CLINIC_OR_DEPARTMENT_OTHER): Payer: Medicaid Other | Attending: Emergency Medicine | Admitting: Pulmonary Disease

## 2017-08-14 VITALS — Ht 65.0 in | Wt 180.0 lb

## 2017-08-14 DIAGNOSIS — G4761 Periodic limb movement disorder: Secondary | ICD-10-CM | POA: Insufficient documentation

## 2017-08-14 DIAGNOSIS — G4736 Sleep related hypoventilation in conditions classified elsewhere: Secondary | ICD-10-CM | POA: Insufficient documentation

## 2017-08-14 DIAGNOSIS — J449 Chronic obstructive pulmonary disease, unspecified: Secondary | ICD-10-CM | POA: Insufficient documentation

## 2017-08-14 DIAGNOSIS — R0902 Hypoxemia: Secondary | ICD-10-CM

## 2017-08-14 DIAGNOSIS — G4734 Idiopathic sleep related nonobstructive alveolar hypoventilation: Secondary | ICD-10-CM

## 2017-08-15 NOTE — Procedures (Signed)
   Patient Name: [name], Coriana Study Date: 08/14/2017 Gender: Female D.O.B: 21-Nov-1955 Age (years): 61 Referring Provider: Levy Pupaobert Byrum Height (inches): 65 Interpreting Physician: Coralyn HellingVineet Leonetta Mcgivern MD, ABSM Weight (lbs): 180 RPSGT: Shelah LewandowskyGregory, Kenyon BMI: 30 MRN: [id number] Neck Size: 13.50  CLINICAL INFORMATION Sleep Study Type: NPSG  Indication for sleep study: COPD, Fatigue, Morning Headaches, Snoring  Epworth Sleepiness Score: 9  SLEEP STUDY TECHNIQUE As per the AASM Manual for the Scoring of Sleep and Associated Events v2.3 (April 2016) with a hypopnea requiring 4% desaturations.  The channels recorded and monitored were frontal, central and occipital EEG, electrooculogram (EOG), submentalis EMG (chin), nasal and oral airflow, thoracic and abdominal wall motion, anterior tibialis EMG, snore microphone, electrocardiogram, and pulse oximetry.  MEDICATIONS Medications self-administered by patient taken the night of the study : N/A  SLEEP ARCHITECTURE The study was initiated at 10:35:44 PM and ended at 5:03:12 AM.  Sleep onset time was 8.8 minutes and the sleep efficiency was 76.1%. The total sleep time was 295.0 minutes.  Stage REM latency was 173.5 minutes.  The patient spent 12.54% of the night in stage N1 sleep, 73.39% in stage N2 sleep, 0.00% in stage N3 and 14.07% in REM.  Alpha intrusion was absent.  Supine sleep was 27.81%.  RESPIRATORY PARAMETERS The overall apnea/hypopnea index (AHI) was 1.0 per hour. There were 5 total apneas, including 0 obstructive, 5 central and 0 mixed apneas. There were 0 hypopneas and 7 RERAs.  The AHI during Stage REM sleep was 1.4 per hour.  AHI while supine was 0.7 per hour.  The mean oxygen saturation was 88.92%. The minimum SpO2 during sleep was 84.00%.  She spent 155 minutes of test time with an SpO2 < 88%.  She was started on 1 liter oxygen and increased to 2 liters.  She had improvement in her oxygenation.  soft snoring was noted  during this study.  CARDIAC DATA The 2 lead EKG demonstrated sinus rhythm. The mean heart rate was 52.92 beats per minute. Other EKG findings include: None.  LEG MOVEMENT DATA The total PLMS were 241 with a resulting PLMS index of 49.02. Associated arousal with leg movement index was 15.9 .  IMPRESSIONS - She did not have significant sleep apnea.  Her AHI was 1.0. - She had significant oxygen desaturation while asleep, and required the use of 2 liters supplemental oxygen. - She had an mild increase in her periodic limb movement index.  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 - She should be assessed for the presence of restless leg syndrome.  [Electronically signed] 08/15/2017 04:03 PM  Coralyn HellingVineet Octavie Westerhold MD, ABSM Diplomate, American Board of Sleep Medicine   NPI: [id number]

## 2017-10-04 ENCOUNTER — Ambulatory Visit: Payer: Medicaid Other | Admitting: Emergency Medicine

## 2017-10-04 ENCOUNTER — Encounter: Payer: Self-pay | Admitting: Emergency Medicine

## 2017-10-04 DIAGNOSIS — J449 Chronic obstructive pulmonary disease, unspecified: Secondary | ICD-10-CM | POA: Diagnosis not present

## 2017-10-04 DIAGNOSIS — G4734 Idiopathic sleep related nonobstructive alveolar hypoventilation: Secondary | ICD-10-CM

## 2017-10-04 DIAGNOSIS — G2581 Restless legs syndrome: Secondary | ICD-10-CM

## 2017-10-04 MED ORDER — FLUTICASONE-UMECLIDIN-VILANT 100-62.5-25 MCG/INH IN AEPB: 1.0000 | INHALATION_SPRAY | Freq: Every day | RESPIRATORY_TRACT | 5 refills | Status: DC

## 2017-10-04 NOTE — Assessment & Plan Note (Signed)
Fairly more clinical benefit from Trelegy compared with Symbicort, Spiriva, or the combination of the 2.  She will need a prior authorization for this.  We will work on it.  Continue albuterol as needed.  Vaccines are up-to-date.   We will work on completing a prior authorization for Trelegy since you did better on this medication.  We will plan to start 1 inhalation once a day.  Please remember to rinse and gargle after using. Once we get the Trelegy please stop your Symbicort. Keep albuterol available to use 1 nebulizer treatment or 2 puffs up to every 4 hours if needed for shortness of breath, wheezing, chest tightness. Your flu shot and your pneumonia shots are up-to-date.  You will need another Pneumovax after age 62 Follow with Dr Delton CoombesByrum in 6 months or sooner if you have any problems

## 2017-10-04 NOTE — Assessment & Plan Note (Signed)
Confirmed on her sleep study from 08/14/17.  She did not have clinically significant obstructive sleep apnea.  She is using 2 L/min effectively.

## 2017-10-04 NOTE — Progress Notes (Signed)
Subjective:    Patient ID: [name], female    DOB: July 14, 1956, 62 y.o.   MRN: [id number]  HPI 62 year old former smoker (7 pack years) with a history of hypertension, restless leg syndrome, childhood asthma. Has been on BD's for asthma as an adult, most recently on QVAR and albuterol prn. COPD that was dx when she was admitted for a flare last year. She was started on O2 after that hospitalization. She has averaged about 3 flares a year for the last several years. She uses albuterol about 4-5 times a day. She is on 2L/min at all times. She has heavy tanks. She had PFT's in March and April when she was being evaluated for disability. She needs to be re-walked for O2.   ROV 06/28/17 -- This is a follow-up visit for COPD/fixed asthma, recent hypoxemic respiratory failure that appeared to have resolved on our initial visit 05/09/17. At her initial visit we did a trial of Trelegy, stopped QVAR, to see if she would benefit. She believes that it has helped significantly - better breathing. Activity has increased and benefited. No apparent side effects. She is using albuterol about every other day. She had an ONO that showed desats to as low as 81%.   ROV 10/04/17 --62 year old woman follows up today for obstructive lung disease, COPD with associated fixed asthma.  She also has confirmed nocturnal hypoxemia by ONO.  Based on this we performed a sleep study on 08/14/17.  She did not have significant sleep apnea saturations were confirmed.  She was titrated to 2 L/min oxygen.  She also had a mild increase in her periodic limb movement index. She uses baclofen for cramping, doesn't feel jerking when she is on this.  We had used Trelegy and she had benefited but apparently needed prior authorization. Currently managed on Symbicort only. She is using albuterol nebs prn, about 2x a week. Some wheeze at night, no cough. No chest pain. She is active, is able to shop, do her chores. Flu shot up to date.      Review of Systems  Constitutional: Negative for fever and unexpected weight change.  HENT: Negative for congestion, dental problem, ear pain, nosebleeds, postnasal drip, rhinorrhea, sinus pressure, sneezing, sore throat and trouble swallowing.   Eyes: Negative for redness and itching.  Respiratory: Positive for chest tightness and shortness of breath. Negative for cough and wheezing.   Cardiovascular: Negative for palpitations and leg swelling.  Gastrointestinal: Negative for nausea and vomiting.  Genitourinary: Negative for dysuria.  Musculoskeletal: Negative for joint swelling.  Skin: Negative for rash.  Neurological: Negative for headaches.  Hematological: Does not bruise/bleed easily.  Psychiatric/Behavioral: Negative for dysphoric mood. The patient is not nervous/anxious.        Objective:   Physical Exam Vitals:   10/04/17 0953 10/04/17 0954  BP:  122/68  Pulse:  68  SpO2:  94%  Weight: 184 lb (83.5 kg)   Height: 5\' 5"  (1.651 m)    Gen: Pleasant, well-nourished, in no distress,  normal affect  ENT: No lesions,  mouth clear,  oropharynx clear w upper and lower dentures, no postnasal drip  Neck: No JVD, no stridor  Lungs: No use of accessory muscles, no wheezing, no crackles  Cardiovascular: RRR, heart sounds normal, no murmur or gallops, no peripheral edema  Musculoskeletal: No deformities, no cyanosis or clubbing  Neuro: alert, non focal  Skin: Warm, no lesions or rashes     Assessment & Plan:  COPD (chronic obstructive pulmonary  disease) (HCC) Fairly more clinical benefit from Trelegy compared with Symbicort, Spiriva, or the combination of the 2.  She will need a prior authorization for this.  We will work on it.  Continue albuterol as needed.  Vaccines are up-to-date.   We will work on completing a prior authorization for Trelegy since you did better on this medication.  We will plan to start 1 inhalation once a day.  Please remember to rinse and gargle  after using. Once we get the Trelegy please stop your Symbicort. Keep albuterol available to use 1 nebulizer treatment or 2 puffs up to every 4 hours if needed for shortness of breath, wheezing, chest tightness. Your flu shot and your pneumonia shots are up-to-date.  You will need another Pneumovax after age 62 Follow with Dr Delton CoombesByrum in 6 months or sooner if you have any problems  RLS (restless legs syndrome) She did have periodic limb movements on her sleep study.  She believes that her leg discomfort is currently adequately treated with baclofen.  May need to consider other medications as we go forward  Nocturnal hypoxemia Confirmed on her sleep study from 08/14/17.  She did not have clinically significant obstructive sleep apnea.  She is using 2 L/min effectively.  Levy Pupaobert Brixton Franko, MD, PhD 10/04/2017, 10:11 AM [facility] [phone] or if no answer [phone]

## 2017-10-04 NOTE — Patient Instructions (Signed)
We will work on completing a prior authorization for Trelegy since you did better on this medication.  We will plan to start 1 inhalation once a day.  Please remember to rinse and gargle after using. Once we get the Trelegy please stop your Symbicort. Keep albuterol available to use 1 nebulizer treatment or 2 puffs up to every 4 hours if needed for shortness of breath, wheezing, chest tightness. Your flu shot and your pneumonia shots are up-to-date.  You will need another Pneumovax after age 62 Follow with Dr Delton CoombesByrum in 6 months or sooner if you have any problems

## 2017-10-04 NOTE — Assessment & Plan Note (Signed)
She did have periodic limb movements on her sleep study.  She believes that her leg discomfort is currently adequately treated with baclofen.  May need to consider other medications as we go forward

## 2017-10-08 ENCOUNTER — Other Ambulatory Visit: Payer: Self-pay | Admitting: Emergency Medicine

## 2018-11-22 ENCOUNTER — Encounter (HOSPITAL_COMMUNITY): Payer: Self-pay | Admitting: Emergency Medicine

## 2018-11-22 ENCOUNTER — Inpatient Hospital Stay (HOSPITAL_COMMUNITY)
Admission: EM | Admit: 2018-11-22 | Discharge: 2018-11-25 | DRG: 871 | Disposition: A | Discharge status: Home / Self Care | Payer: Medicare Other | Attending: Internal Medicine | Admitting: Internal Medicine

## 2018-11-22 ENCOUNTER — Emergency Department (HOSPITAL_COMMUNITY): Payer: Medicare Other

## 2018-11-22 DIAGNOSIS — J13 Pneumonia due to Streptococcus pneumoniae: Secondary | ICD-10-CM | POA: Diagnosis not present

## 2018-11-22 DIAGNOSIS — Z7951 Long term (current) use of inhaled steroids: Secondary | ICD-10-CM | POA: Diagnosis not present

## 2018-11-22 DIAGNOSIS — G2581 Restless legs syndrome: Secondary | ICD-10-CM | POA: Diagnosis not present

## 2018-11-22 DIAGNOSIS — J440 Chronic obstructive pulmonary disease with acute lower respiratory infection: Secondary | ICD-10-CM | POA: Diagnosis not present

## 2018-11-22 DIAGNOSIS — J441 Chronic obstructive pulmonary disease with (acute) exacerbation: Secondary | ICD-10-CM | POA: Diagnosis present

## 2018-11-22 DIAGNOSIS — J9621 Acute and chronic respiratory failure with hypoxia: Secondary | ICD-10-CM | POA: Diagnosis present

## 2018-11-22 DIAGNOSIS — I959 Hypotension, unspecified: Secondary | ICD-10-CM | POA: Diagnosis not present

## 2018-11-22 DIAGNOSIS — I10 Essential (primary) hypertension: Secondary | ICD-10-CM | POA: Diagnosis present

## 2018-11-22 DIAGNOSIS — Z23 Encounter for immunization: Secondary | ICD-10-CM | POA: Diagnosis not present

## 2018-11-22 DIAGNOSIS — J449 Chronic obstructive pulmonary disease, unspecified: Secondary | ICD-10-CM | POA: Diagnosis not present

## 2018-11-22 DIAGNOSIS — J9601 Acute respiratory failure with hypoxia: Secondary | ICD-10-CM

## 2018-11-22 DIAGNOSIS — E860 Dehydration: Secondary | ICD-10-CM | POA: Diagnosis not present

## 2018-11-22 DIAGNOSIS — N179 Acute kidney failure, unspecified: Secondary | ICD-10-CM | POA: Diagnosis not present

## 2018-11-22 DIAGNOSIS — R197 Diarrhea, unspecified: Secondary | ICD-10-CM

## 2018-11-22 DIAGNOSIS — R6520 Severe sepsis without septic shock: Secondary | ICD-10-CM

## 2018-11-22 DIAGNOSIS — E876 Hypokalemia: Secondary | ICD-10-CM | POA: Diagnosis present

## 2018-11-22 DIAGNOSIS — F17210 Nicotine dependence, cigarettes, uncomplicated: Secondary | ICD-10-CM | POA: Diagnosis not present

## 2018-11-22 DIAGNOSIS — J181 Lobar pneumonia, unspecified organism: Secondary | ICD-10-CM | POA: Diagnosis not present

## 2018-11-22 DIAGNOSIS — Z8249 Family history of ischemic heart disease and other diseases of the circulatory system: Secondary | ICD-10-CM

## 2018-11-22 DIAGNOSIS — A419 Sepsis, unspecified organism: Principal | ICD-10-CM | POA: Diagnosis present

## 2018-11-22 DIAGNOSIS — Z841 Family history of disorders of kidney and ureter: Secondary | ICD-10-CM

## 2018-11-22 DIAGNOSIS — J189 Pneumonia, unspecified organism: Secondary | ICD-10-CM | POA: Diagnosis not present

## 2018-11-22 DIAGNOSIS — R531 Weakness: Secondary | ICD-10-CM | POA: Diagnosis not present

## 2018-11-22 DIAGNOSIS — Z87891 Personal history of nicotine dependence: Secondary | ICD-10-CM

## 2018-11-22 DIAGNOSIS — F17200 Nicotine dependence, unspecified, uncomplicated: Secondary | ICD-10-CM | POA: Diagnosis present

## 2018-11-22 LAB — CBC WITH DIFFERENTIAL/PLATELET
ABS IMMATURE GRANULOCYTES: 0.36 10*3/uL — AB (ref 0.00–0.07)
Basophils Absolute: 0.1 10*3/uL (ref 0.0–0.1)
Basophils Relative: 0 %
EOS ABS: 0 10*3/uL (ref 0.0–0.5)
Eosinophils Relative: 0 %
HEMATOCRIT: 37.7 % (ref 36.0–46.0)
Hemoglobin: 12.3 g/dL (ref 12.0–15.0)
Immature Granulocytes: 2 %
LYMPHS ABS: 2.2 10*3/uL (ref 0.7–4.0)
Lymphocytes Relative: 11 %
MCH: 29.1 pg (ref 26.0–34.0)
MCHC: 32.6 g/dL (ref 30.0–36.0)
MCV: 89.3 fL (ref 80.0–100.0)
MONOS PCT: 5 %
Monocytes Absolute: 1 10*3/uL (ref 0.1–1.0)
NEUTROS ABS: 17.4 10*3/uL — AB (ref 1.7–7.7)
NEUTROS PCT: 82 %
Platelets: 236 10*3/uL (ref 150–400)
RBC: 4.22 MIL/uL (ref 3.87–5.11)
RDW: 13.5 % (ref 11.5–15.5)
WBC: 21 10*3/uL — ABNORMAL HIGH (ref 4.0–10.5)
nRBC: 0 % (ref 0.0–0.2)

## 2018-11-22 LAB — COMPREHENSIVE METABOLIC PANEL
ALBUMIN: 3.1 g/dL — AB (ref 3.5–5.0)
ALT: 10 U/L (ref 0–44)
ANION GAP: 12 (ref 5–15)
AST: 15 U/L (ref 15–41)
Alkaline Phosphatase: 98 U/L (ref 38–126)
BUN: 21 mg/dL (ref 8–23)
CALCIUM: 8.4 mg/dL — AB (ref 8.9–10.3)
CO2: 26 mmol/L (ref 22–32)
Chloride: 96 mmol/L — ABNORMAL LOW (ref 98–111)
Creatinine, Ser: 1.71 mg/dL — ABNORMAL HIGH (ref 0.44–1.00)
GFR calc Af Amer: 37 mL/min — ABNORMAL LOW (ref >60)
GFR calc non Af Amer: 32 mL/min — ABNORMAL LOW (ref >60)
GLUCOSE: 146 mg/dL — AB (ref 70–99)
Potassium: 2.7 mmol/L — CL (ref 3.5–5.1)
SODIUM: 134 mmol/L — AB (ref 135–145)
Total Bilirubin: 2.5 mg/dL — ABNORMAL HIGH (ref 0.3–1.2)
Total Protein: 6.7 g/dL (ref 6.5–8.1)

## 2018-11-22 LAB — LACTIC ACID, PLASMA
Lactic Acid, Venous: 1 mmol/L (ref 0.5–1.9)
Lactic Acid, Venous: 1 mmol/L (ref 0.5–1.9)

## 2018-11-22 MED ORDER — SODIUM CHLORIDE 0.9 % IV SOLN
2.0000 g | INTRAVENOUS | Status: DC
  Administered 2018-11-22: 2 g via INTRAVENOUS
  Filled 2018-11-22: qty 20

## 2018-11-22 MED ORDER — SODIUM CHLORIDE 0.9 % IV BOLUS
1000.0000 mL | Freq: Once | INTRAVENOUS | Status: AC
  Administered 2018-11-22: 1000 mL via INTRAVENOUS

## 2018-11-22 MED ORDER — POTASSIUM CHLORIDE 10 MEQ/100ML IV SOLN
10.0000 meq | INTRAVENOUS | Status: AC
  Administered 2018-11-23 (×4): 10 meq via INTRAVENOUS
  Filled 2018-11-22 (×5): qty 100

## 2018-11-22 MED ORDER — SODIUM CHLORIDE 0.9 % IV SOLN
500.0000 mg | INTRAVENOUS | Status: DC
  Administered 2018-11-22: 500 mg via INTRAVENOUS
  Filled 2018-11-22: qty 500

## 2018-11-22 NOTE — ED Notes (Signed)
[place], triage RN aware of previously charted vital signs.

## 2018-11-22 NOTE — ED Provider Notes (Addendum)
[facility]-EMERGENCY DEPT Provider Note   CSN: [id number] Arrival date & time: 11/22/18  2011    History   Chief Complaint Chief Complaint  Patient presents with  . Weakness  . Diarrhea    HPI [name] is a 63 y.o. female.     HPI Patient presents with weakness nausea and diarrhea.  Has had since Monday with today being Friday.  States she is feeling worse all over.  Had a somewhat decreased oral intake.  History of COPD and has had a cough.  States her cough has not really changed however.  No real sputum production.  No fevers but has had some chills.  No sore throat. Past Medical History:  Diagnosis Date  . Asthma   . Hypertension   . Nocturnal leg cramps 06/22/2015  . RLS (restless legs syndrome) 06/22/2015  . Thyroid disease     Patient Active Problem List   Diagnosis Date Noted  . Acute respiratory failure with hypoxia (HCC) 11/23/2018  . AKI (acute kidney injury) (HCC) 11/23/2018  . CAP (community acquired pneumonia) 11/23/2018  . Sepsis (HCC) 11/23/2018  . Diarrhea 11/23/2018  . Dehydration 11/23/2018  . Hypokalemia 11/23/2018  . COPD (chronic obstructive pulmonary disease) (HCC) 09/06/2015  . Nocturnal hypoxemia 09/06/2015  . Nicotine dependence 09/06/2015  . RLS (restless legs syndrome) 06/22/2015  . Nocturnal leg cramps 06/22/2015  . DEPRESSION/ANXIETY 06/30/2010  . OTHER CONVULSIONS 06/30/2010  . TORTICOLLIS 06/16/2010  . ONYCHOMYCOSIS 03/01/2009  . TOBACCO ABUSE 11/20/2008  . ASTHMA 11/20/2008  . HYPERTENSION, BENIGN ESSENTIAL 09/11/2005    Past Surgical History:  Procedure Laterality Date  . ABDOMINAL HYSTERECTOMY    . APPENDECTOMY       OB History   No obstetric history on file.      Home Medications    Prior to Admission medications   Medication Sig Start Date End Date Taking? Authorizing Provider  albuterol (PROVENTIL HFA;VENTOLIN HFA) 108 (90 BASE) MCG/ACT inhaler Inhale 2 puffs into the lungs every 6  (six) hours as needed for wheezing or shortness of breath.   Yes [provider]  albuterol (PROVENTIL) (5 MG/ML) 0.5% nebulizer solution Take 0.5 mLs (2.5 mg total) by nebulization every 6 (six) hours as needed for wheezing or shortness of breath. 03/03/16  Yes Standley Brooking, MD  aspirin 81 MG chewable tablet Chew 81 mg by mouth daily.   Yes [provider]  Cholecalciferol (VITAMIN D) 50 MCG (2000 UT) tablet Take 2,000 Units by mouth daily.   Yes [provider]  escitalopram (LEXAPRO) 10 MG tablet Take 10 mg by mouth daily. 09/18/18  Yes [provider]  hydrochlorothiazide (HYDRODIURIL) 25 MG tablet Take 25 mg by mouth daily. 09/03/15  Yes [provider]  Multiple Vitamin (MULTIVITAMIN WITH MINERALS) TABS tablet Take 1 tablet by mouth daily.   Yes [provider]  OXYGEN Inhale 2 L into the lungs at bedtime.   Yes [provider]  Probiotic Product (PROBIOTIC DAILY) CAPS Take 1 capsule by mouth daily.   Yes [provider]  budesonide-formoterol (SYMBICORT) 160-4.5 MCG/ACT inhaler Inhale 2 puffs into the lungs 2 (two) times daily. 11/25/18   Pokhrel, Rebekah Chesterfield, MD  gabapentin (NEURONTIN) 400 MG capsule Take 1,600 mg by mouth at bedtime.  07/27/15   [provider]  potassium chloride 20 MEQ TBCR Take 20 mEq by mouth daily for 5 days. 11/25/18 11/30/18  Pokhrel, Rebekah Chesterfield, MD  traMADol (ULTRAM) 50 MG tablet Take 50 mg by  mouth every 6 (six) hours as needed for moderate pain.  06/18/18   [provider]    Family History Family History  Problem Relation Age of Onset  . Kidney failure Mother   . Heart failure Mother   . Heart attack Father   . Heart attack Brother     Social History Social History   Tobacco Use  . Smoking status: Former Smoker    Packs/day: 1.00    Types: Cigarettes    Last attempt to quit: 05/09/2016    Years since quitting: 2.5  . Smokeless tobacco: Never Used  Substance Use Topics   . Alcohol use: No  . Drug use: No     Allergies   Patient has no known allergies.   Review of Systems Review of Systems  Constitutional: Positive for appetite change and chills. Negative for fever.  HENT: Negative for congestion.   Respiratory: Positive for cough.   Gastrointestinal: Positive for diarrhea and nausea.  Endocrine: Negative for polyuria.  Genitourinary: Negative for dysuria.  Musculoskeletal: Negative for back pain.  Skin: Negative for rash.  Neurological: Negative for weakness.  Psychiatric/Behavioral: Negative for confusion.     Physical Exam Updated Vital Signs BP 109/89 (BP Location: Left Arm)   Pulse 80   Temp 98.7 F (37.1 C) (Oral)   Resp 20   Ht  (1.651 m)   Wt 91.9 kg   SpO2 97%   BMI 33.71 kg/m   Physical Exam Vitals signs and nursing note reviewed.  HENT:     Head: Atraumatic.     Mouth/Throat:     Mouth: Mucous membranes are dry.  Eyes:     Pupils: Pupils are equal, round, and reactive to light.  Neck:     Musculoskeletal: Neck supple.  Cardiovascular:     Rate and Rhythm: Regular rhythm.  Pulmonary:     Breath sounds: No wheezing or rhonchi.     Comments: Somewhat harsh breath sounds. Abdominal:     Tenderness: There is no abdominal tenderness.  Musculoskeletal:     Right lower leg: No edema.     Left lower leg: No edema.  Skin:    General: Skin is warm.     Capillary Refill: Capillary refill takes less than 2 seconds.  Neurological:     General: No focal deficit present.     Mental Status: She is alert.  Psychiatric:        Mood and Affect: Mood normal.      ED Treatments / Results  Labs (all labs ordered are listed, but only abnormal results are displayed) Labs Reviewed  COMPREHENSIVE METABOLIC PANEL - Abnormal; Notable for the following components:      Result Value   Sodium 134 (*)    Potassium 2.7 (*)    Chloride 96 (*)    Glucose, Bld 146 (*)    Creatinine, Ser 1.71 (*)    Calcium 8.4 (*)     Albumin 3.1 (*)    Total Bilirubin 2.5 (*)    GFR calc non Af Amer 32 (*)    GFR calc Af Amer 37 (*)    All other components within normal limits  CBC WITH DIFFERENTIAL/PLATELET - Abnormal; Notable for the following components:   WBC 21.0 (*)    Neutro Abs 17.4 (*)    Abs Immature Granulocytes 0.36 (*)    All other components within normal limits  PHOSPHORUS - Abnormal; Notable for the following components:   Phosphorus 2.2 (*)  All other components within normal limits  BLOOD GAS, VENOUS - Abnormal; Notable for the following components:   Acid-Base Excess 2.7 (*)    All other components within normal limits  PHOSPHORUS - Abnormal; Notable for the following components:   Phosphorus 1.9 (*)    All other components within normal limits  COMPREHENSIVE METABOLIC PANEL - Abnormal; Notable for the following components:   Potassium 2.9 (*)    Glucose, Bld 136 (*)    Creatinine, Ser 1.27 (*)    Calcium 7.6 (*)    Total Protein 6.3 (*)    Albumin 2.9 (*)    Total Bilirubin 1.7 (*)    GFR calc non Af Amer 45 (*)    GFR calc Af Amer 52 (*)    All other components within normal limits  CBC - Abnormal; Notable for the following components:   WBC 16.1 (*)    All other components within normal limits  STREP PNEUMONIAE URINARY ANTIGEN - Abnormal; Notable for the following components:   Strep Pneumo Urinary Antigen POSITIVE (*)    All other components within normal limits  CBC - Abnormal; Notable for the following components:   WBC 13.8 (*)    RBC 3.57 (*)    Hemoglobin 10.3 (*)    HCT 32.6 (*)    All other components within normal limits  BASIC METABOLIC PANEL - Abnormal; Notable for the following components:   Potassium 3.1 (*)    Calcium 7.8 (*)    All other components within normal limits  CULTURE, BLOOD (ROUTINE X 2)  CULTURE, BLOOD (ROUTINE X 2)  RESPIRATORY PANEL BY PCR  MRSA PCR SCREENING  LACTIC ACID, PLASMA  LACTIC ACID, PLASMA  URINALYSIS, ROUTINE W REFLEX MICROSCOPIC   MAGNESIUM  LACTATE DEHYDROGENASE  CREATININE, URINE, RANDOM  SODIUM, URINE, RANDOM  MAGNESIUM  TSH  HIV ANTIBODY (ROUTINE TESTING W REFLEX)  LEGIONELLA PNEUMOPHILA SEROGP 1 UR AG  INFLUENZA PANEL BY PCR (TYPE A & B)  PROCALCITONIN  LEGIONELLA PNEUMOPHILA SEROGP 1 UR AG  MAGNESIUM    EKG EKG Interpretation  Date/Time:  Friday November 22 2018 22:33:54 EDT Ventricular Rate:  63 PR Interval:    QRS Duration: 83 QT Interval:  585 QTC Calculation: 599 R Axis:   92 Text Interpretation:  Sinus rhythm Right axis deviation Borderline T wave abnormalities Prolonged QT interval Confirmed by Benjiman Core [phone]) on 11/22/2018 10:59:01 PM Also confirmed by Benjiman Core [phone]), editor Barbette Hair [phone])  on 11/23/2018 10:14:08 AM   Radiology No results found.  Procedures Procedures (including critical care time)  Medications Ordered in ED Medications  0.9 %  sodium chloride infusion ( Intravenous Stopping Infusion hung by another clincian 11/23/18 1200)  sodium chloride 0.9 % bolus 1,000 mL (0 mLs Intravenous Stopped 11/22/18 2327)  sodium chloride 0.9 % bolus 1,000 mL (0 mLs Intravenous Stopped 11/23/18 0045)  potassium chloride 10 mEq in 100 mL IVPB (0 mEq Intravenous Stopped 11/23/18 0613)  vancomycin (VANCOCIN) 1,500 mg in sodium chloride 0.9 % 500 mL IVPB (0 mg Intravenous Stopped 11/23/18 0445)  potassium chloride 10 mEq in 100 mL IVPB (0 mEq Intravenous Stopped 11/23/18 1224)  magnesium sulfate IVPB 1 g 100 mL ( Intravenous Stopped 11/23/18 0648)  pneumococcal 23 valent vaccine (PNU-IMMUNE) injection 0.5 mL (0.5 mLs Intramuscular Given 11/25/18 0936)  sodium chloride 0.9 % bolus 1,000 mL (0 mLs Intravenous Stopping Infusion hung by another clincian 11/23/18 0900)     Initial Impression / Assessment and Plan / ED  Course  I have reviewed the triage vital signs and the nursing notes.  Pertinent labs & imaging results that were available during my care of the patient were  reviewed by me and considered in my medical decision making (see chart for details).        Patient presents with mostly diarrhea.  Generalized weakness.  History of COPD.  Has had a cough but no real sputum change.  X-ray shows possible pneumonia.  White count elevated.  Initial hypotension has improved with IV fluids.  Normal lactic acid.  However with mild hypoxia at times and worsening kidney function feels the patient benefit from admission to hospital.  Code sepsis called after x-ray showed pneumonia and that was the hypotension although I think likely hypotension related more to dehydration from the diarrhea as opposed to sepsis.  Will admit to hospitalist.  CRITICAL CARE Performed by: Benjiman Core Total critical care time: 30 minutes Critical care time was exclusive of separately billable procedures and treating other patients. Critical care was necessary to treat or prevent imminent or life-threatening deterioration. Critical care was time spent personally by me on the following activities: development of treatment plan with patient and/or surrogate as well as nursing, discussions with consultants, evaluation of patient's response to treatment, examination of patient, obtaining history from patient or surrogate, ordering and performing treatments and interventions, ordering and review of laboratory studies, ordering and review of radiographic studies, pulse oximetry and re-evaluation of patient's condition.   Final Clinical Impressions(s) / ED Diagnoses   Final diagnoses:  Community acquired pneumonia, unspecified laterality  AKI (acute kidney injury) (HCC)  Diarrhea, unspecified type    ED Discharge Orders         Ordered    budesonide-formoterol (SYMBICORT) 160-4.5 MCG/ACT inhaler  2 times daily     11/25/18 0805    levofloxacin (LEVAQUIN) 750 MG tablet  Daily     11/25/18 0805    Diet - low sodium heart healthy     11/25/18 0805    Increase activity slowly      11/25/18 0805    Discharge instructions    Comments:  Follow up with your primary care physician in one week. Complete the course of antibiotics. Use oxygen at 2 liters per minute. Discuss about Chest xray in your next visit.   11/25/18 0805    potassium chloride 20 MEQ TBCR  Daily     11/25/18 0805           Benjiman Core, MD 11/22/18 7829    Benjiman Core, MD 12/05/18 [phone]

## 2018-11-22 NOTE — H&P (Addendum)
[name] HQR:[id number] DOB: Oct 27, 1955 DOA: 11/22/2018     PCP: Kerin Perna, NP   Outpatient Specialists:    Pulmonary  Dr. Halford Chessman    Patient arrived to ER on 11/22/18 at 2011  Patient coming from: home Lives   With family    Chief Complaint:  Chief Complaint  Patient presents with  . Weakness  . Diarrhea    HPI: [name] is a 63 y.o. female with medical history significant of COPD, hypertension, restless leg syndrome, Graves disease, nocturnal hypoxemia    Presented with   Flu like symptoms weakness, nausea and diarrhea for the past 5 days been feeling sore all over.  Decreased p.o. intake. Reports subjective fever at home increased shortness of breath. She has a nebulizer machine at home. She tried to use it every day, reports some wheezing no chest pain  Denies worsening cough or sputum production no fever but had some chills Reports she has an adopted 2y old at home She is not in day care Her daughter had similar illness last week she had a respiratory infection And was treated with some antibiotics. Her daughter did not have any diarrhea.  No recent antibiotics recently   No travel hx States they only go to church and grocery store   Regarding pertinent Chronic problems: known hx of COPD and asthma to be admitted to the hospital last year with COPD exacerbation Usually well maintained on Qvar and albuterol as needed at home. There is no nocturnal hypoxemia on sleep study but did not have significant obstructive sleep apnea she uses 2 L of oxygen when sleeping While in ER: Found to be hypoxic down to 87% room air coarse breath sounds The following Work up has been ordered so far:  Orders Placed This Encounter  Procedures  . Blood Culture (routine x 2)  . DG Chest 2 View  . Lactic acid, plasma  . Comprehensive metabolic panel  . CBC with Differential  . Urinalysis, Routine w reflex microscopic  . Diet NPO time specified  . Cardiac monitoring   . Refer to Sidebar Report for: Sepsis Bundle ED/IP  . Document vital signs within 1-hour of fluid bolus completion and notify provider of bolus completion  . Document Actual / Estimated Weight  . Insert peripheral IV x 2  . Initiate Carrier Fluid Protocol  . Re-check Vital Signs  . Call Code Sepsis ([facility] [phone]) Reason for Consult? tracking  . Consult to hospitalist  . Pulse oximetry, continuous  . ED EKG 12-Lead  . EKG 12-Lead    Following Medications were ordered in ER: Medications  cefTRIAXone (ROCEPHIN) 2 g in sodium chloride 0.9 % 100 mL IVPB (0 g Intravenous Stopped 11/22/18 2327)  azithromycin (ZITHROMAX) 500 mg in sodium chloride 0.9 % 250 mL IVPB (500 mg Intravenous New Bag/Given 11/22/18 2329)  sodium chloride 0.9 % bolus 1,000 mL (0 mLs Intravenous Stopped 11/22/18 2327)  sodium chloride 0.9 % bolus 1,000 mL (1,000 mLs Intravenous New Bag/Given 11/22/18 2254)    Significant initial  Findings: Abnormal Labs Reviewed  COMPREHENSIVE METABOLIC PANEL - Abnormal; Notable for the following components:      Result Value   Sodium 134 (*)    Potassium 2.7 (*)    Chloride 96 (*)    Glucose, Bld 146 (*)    Creatinine, Ser 1.71 (*)    Calcium 8.4 (*)    Albumin 3.1 (*)    Total Bilirubin 2.5 (*)    GFR calc  non Af Amer 32 (*)    GFR calc Af Amer 37 (*)    All other components within normal limits  CBC WITH DIFFERENTIAL/PLATELET - Abnormal; Notable for the following components:   WBC 21.0 (*)    Neutro Abs 17.4 (*)    Abs Immature Granulocytes 0.36 (*)    All other components within normal limits     Lactic Acid, Venous    Component Value Date/Time   LATICACIDVEN 1.0 11/22/2018 2112    Na 134   Cr    Up from baseline see below Lab Results  Component Value Date   CREATININE 1.71 (H) 11/22/2018   CREATININE 0.94 03/02/2016   CREATININE 1.15 (H) 09/07/2015    WBC  21  HG/HCT   Stable     Component Value Date/Time   HGB 12.3 11/22/2018 2112   HCT 37.7  11/22/2018 2112    Troponin (Point of Care Test) No results for input(s): TROPIPOC in the last 72 hours.    UA  ordered  CXR -left upper lung pneumonia   ECG:  Personally reviewed by me showing: HR : 63 Rhythm:  NSR, right axis deviation T wave abnormality  nonspecific changes,   QTC 599      ED Triage Vitals  Enc Vitals Group     BP 11/22/18 2026 (!) 85/60     Pulse Rate 11/22/18 2026 79     Resp 11/22/18 2026 20     Temp 11/22/18 2026 98 F (36.7 C)     Temp Source 11/22/18 2026 Oral     SpO2 11/22/18 2026 94 %     Weight 11/22/18 2026 185 lb (83.9 kg)     Height 11/22/18 2026 5' 5"  (1.651 m)     Head Circumference --      Peak Flow --      Pain Score 11/22/18 2111 0     Pain Loc --      Pain Edu? --      Excl. in [place]? --   TMAX(24)@       Latest  Blood pressure 111/65, pulse 61, temperature 98 F (36.7 C), temperature source Oral, resp. rate (!) 25, height 5' 5"  (1.651 m), weight 83.9 kg, SpO2 (!) 87 %.     Hospitalist was called for admission for respiratory failure secondary to community-acquired pneumonia   Review of Systems:    Pertinent positives include:  chills, fatigue,  non-productive cough abdominal pain, nausea, vomiting, diarrhea, Constitutional:  No weight loss, night sweats, Fevers, weight loss  HEENT:  No headaches, Difficulty swallowing,Tooth/dental problems,Sore throat,  No sneezing, itching, ear ache, nasal congestion, post nasal drip,  Cardio-vascular:  No chest pain, Orthopnea, PND, anasarca, dizziness, palpitations.no Bilateral lower extremity swelling  GI:  No heartburn, indigestion,  change in bowel habits, loss of appetite, melena, blood in stool, hematemesis Resp:  no shortness of breath at rest. No dyspnea on exertion, No excess mucus, no productive cough, No, No coughing up of blood.No change in color of mucus.No wheezing. Skin:  no rash or lesions. No jaundice GU:  no dysuria, change in color of urine, no urgency or frequency.  No straining to urinate.  No flank pain.  Musculoskeletal:  No joint pain or no joint swelling. No decreased range of motion. No back pain.  Psych:  No change in mood or affect. No depression or anxiety. No memory loss.  Neuro: no localizing neurological complaints, no tingling, no weakness, no double vision, no gait abnormality,  no slurred speech, no confusion  All systems reviewed and apart from [place] all are negative  Past Medical History:   Past Medical History:  Diagnosis Date  . Asthma   . Hypertension   . Nocturnal leg cramps 06/22/2015  . RLS (restless legs syndrome) 06/22/2015  . Thyroid disease       Past Surgical History:  Procedure Laterality Date  . ABDOMINAL HYSTERECTOMY    . APPENDECTOMY      Social History:  Ambulatory   Independently      reports that she quit smoking about 2 years ago. Her smoking use included cigarettes. She smoked 1.00 pack per day. She has never used smokeless tobacco. She reports that she does not drink alcohol or use drugs.     Family History:   Family History  Problem Relation Age of Onset  . Kidney failure Mother   . Heart failure Mother   . Heart attack Father   . Heart attack Brother     Allergies: No Known Allergies   Prior to Admission medications   Medication Sig Start Date End Date Taking? Authorizing Provider  albuterol (PROVENTIL HFA;VENTOLIN HFA) 108 (90 BASE) MCG/ACT inhaler Inhale 2 puffs into the lungs every 6 (six) hours as needed for wheezing or shortness of breath.    [provider]  albuterol (PROVENTIL) (5 MG/ML) 0.5% nebulizer solution Take 0.5 mLs (2.5 mg total) by nebulization every 6 (six) hours as needed for wheezing or shortness of breath. 03/03/16   Samuella Cota, MD  baclofen (LIORESAL) 10 MG tablet Take 1 tablet (10 mg total) by mouth at bedtime. 06/22/15   Kathrynn Ducking, MD  budesonide-formoterol [facility]) 160-4.5 MCG/ACT inhaler Inhale 2 puffs into the lungs 2 (two) times  daily.    [provider]  calcium carbonate (TUMS - DOSED IN MG ELEMENTAL CALCIUM) 500 MG chewable tablet Chew 4 tablets by mouth daily as needed for indigestion or heartburn.    [provider]  gabapentin (NEURONTIN) 400 MG capsule Take 1,600 mg by mouth at bedtime.  07/27/15   [provider]  hydrochlorothiazide (HYDRODIURIL) 25 MG tablet Take 25 mg by mouth daily. 09/03/15   [provider]  ibuprofen (ADVIL,MOTRIN) 800 MG tablet Take 800 mg by mouth every 8 (eight) hours as needed.    [provider]  methocarbamol (ROBAXIN) 500 MG tablet Take 500 mg by mouth 4 (four) times daily.    [provider]  Multiple Vitamin (MULTIVITAMIN WITH MINERALS) TABS tablet Take 1 tablet by mouth daily.    [provider]  Donnal Debar 100-62.5-25 MCG/INH AEPB inhale 1 PUFFS into THE lungs EVERY DAY 10/08/17   Collene Gobble, MD   Physical Exam: Blood pressure 111/65, pulse 61, temperature 98 F (36.7 C), temperature source Oral, resp. rate (!) 25, height 5' 5"  (1.651 m), weight 83.9 kg, SpO2 (!) 87 %. 1. General:  in  Acute distress increased work of breathing     Chronically ill  acutely ill -appearing 2. Psychological: Alert and  Oriented 3. Head/ENT:     Dry Mucous Membranes                          Head Non traumatic, neck supple                            Poor Dentition 4. SKIN:  decreased Skin turgor,  Skin clean  Dry and intact no rash 5. Heart: Regular rate and rhythm no  Murmur, no Rub or gallop 6. Lungs:  no wheezes or crackles   7. Abdomen: Soft,  non-tender,  Distended obese bowel sounds present 8. Lower extremities: no clubbing, cyanosis, no   edema 9. Neurologically Grossly intact, moving all 4 extremities equally   10. MSK: Normal range of motion   LABS:     Recent Labs  Lab 11/22/18 2112  WBC 21.0*  NEUTROABS 17.4*  HGB 12.3  HCT 37.7  MCV 89.3  PLT 694   Basic Metabolic Panel: Recent Labs  Lab 11/22/18  2112  NA 134*  K 2.7*  CL 96*  CO2 26  GLUCOSE 146*  BUN 21  CREATININE 1.71*  CALCIUM 8.4*      Recent Labs  Lab 11/22/18 2112  AST 15  ALT 10  ALKPHOS 98  BILITOT 2.5*  PROT 6.7  ALBUMIN 3.1*   No results for input(s): LIPASE, AMYLASE in the last 168 hours. No results for input(s): AMMONIA in the last 168 hours.    HbA1C: No results for input(s): HGBA1C in the last 72 hours. CBG: No results for input(s): GLUCAP in the last 168 hours.    Urine analysis:    Component Value Date/Time   COLORURINE YELLOW 08/29/2015 1935   APPEARANCEUR CLEAR 08/29/2015 1935   LABSPEC 1.025 08/29/2015 1935   PHURINE 6.0 08/29/2015 1935   GLUCOSEU NEGATIVE 08/29/2015 [place] NEGATIVE 08/29/2015 1935   HGBUR negative 03/25/2009 [place] 08/29/2015 1935   KETONESUR NEGATIVE 08/29/2015 1935   PROTEINUR NEGATIVE 08/29/2015 1935   UROBILINOGEN 0.2 03/09/2014 2122   NITRITE NEGATIVE 08/29/2015 1935   LEUKOCYTESUR NEGATIVE 08/29/2015 1935    Cultures: No results found for: SDES, SPECREQUEST, CULT, REPTSTATUS   Radiological Exams on Admission: Dg Chest 2 View  Result Date: 11/22/2018 CLINICAL DATA:  Flu-like symptoms. Weakness, nausea, and diarrhea since Monday. Previous smoker. EXAM: CHEST - 2 VIEW COMPARISON:  03/02/2016 FINDINGS: Shallow inspiration. Heart size and pulmonary vascularity are normal. Infiltration in the left upper lung and perihilar region likely representing pneumonia. Follow-up after resolution of acute process is recommended to exclude underlying lesion. Right lung is clear. No blunting of costophrenic angles. No pneumothorax. IMPRESSION: Left upper lung pneumonia. Followup PA and lateral chest X-ray is recommended in 3-4 weeks following appropriate clinical therapy to ensure resolution and exclude underlying malignancy. Electronically Signed   By: Lucienne Capers M.D.   On: 11/22/2018 21:38   Ct Chest Wo Contrast  Result Date: 11/23/2018  CLINICAL DATA:  Pneumonia and dyspnea. EXAM: CT CHEST WITHOUT CONTRAST TECHNIQUE: Multidetector CT imaging of the chest was performed following the standard protocol without IV contrast. COMPARISON:  08/30/2015 FINDINGS: Cardiovascular: Normal heart size. No pericardial effusion. Coronary artery and aortic calcifications. Normal caliber thoracic aorta. Mediastinum/Nodes: Esophagus is decompressed. No significant lymphadenopathy in the chest. Lungs/Pleura: Left upper lung consolidation with air bronchograms consistent with pneumonia. Right lung is clear and expanded. No pleural effusions. No pneumothorax. Airways are patent. Upper Abdomen: No acute abnormalities demonstrated. Musculoskeletal: Degenerative changes in the spine. No destructive bone lesions. IMPRESSION: Left upper lung consolidation with air bronchograms consistent with pneumonia. Followup PA and lateral chest X-ray is recommended in 3-4 weeks following appropriate clinical therapy to ensure resolution and exclude underlying malignancy. Electronically Signed   By: Lucienne Capers M.D.   On: 11/23/2018 00:53    Chart has been reviewed    Assessment/Plan  63 y.o. female with medical history significant of COPD, hypertension, restless leg syndrome, Graves disease, nocturnal hypoxemia    Admitted for CAP with acute respiratory failure  Present on Admission: . CAP (community acquired pneumonia) -    will admit for treatment of CAP will start on appropriate antibiotic coverage.   Obtain:  sputum cultures,                  Obtain respiratory panel and influenza serologies                  blood cultures                   strep pneumo UA antigen,                    given diarrhea also check for Legionella.                Provide oxygen as needed.                No travel hx or known exposure              CT showing unilateral PNA                 PCCM aware               Droplet precautions until further work-up resulted   . Acute  respiratory failure with hypoxia (HCC) - due to large LUL PNA  . AKI (acute kidney injury) ([place]) - will rehydrate,  . COPD (chronic obstructive pulmonary disease) (HCC) - chronic given severe PNA will hold off on steroids continue as needed nebulizers and inhalers VBG showing no evidence of hypercarbia  . Nicotine dependence - now in remission  . Sepsis ([place]) -  -SIRS criteria met with  elevated white blood cell count,  fever.    With evidence of end organ damage such as  acute renal failure, hypoxia admit to stepdown -Most likely source being:  Pulmonary,   - Obtain serial lactic acid and procalcitonin level.  - Initiate IV antibiotics   - await results of blood and sputum culture  - Rehydrate aggressively  Dehydration - will rehydrate Diarrhea - will rehydrate, check gastric panel hypokalemia- - will replace and repeat in AM,  check magnesium level and replace as needed   Other plan as per orders.  DVT prophylaxis:  Lovenox     Code Status:  FULL CODE   as per patient I had personally discussed CODE STATUS with patient    Family Communication:   Family not  at  Bedside    Disposition Plan:      To home once workup is complete and patient is stable   Consults called: none  Admission status:   inpatient     Expect 2 midnight stay secondary to severity of patient's current illness including   hemodynamic instability despite optimal treatment (  Hypotension hypoxia )  Severe lab/radiological/exam abnormalities including:  Large Left upper lobe PNA   and extensive comorbidities including:   COPD  That are currently affecting medical management.   I expect  patient to be hospitalized for 2 midnights requiring inpatient medical care.  Patient is at high risk for adverse outcome (such as loss of life or disability) if not treated.  Indication for inpatient stay as follows:  Severe change from baseline regarding mental status Hemodynamic instability despite maximal medical  therapy,    New  or worsening hypoxia  Need for IV antibiotics, IV fluids,        Level of care     SDU tele indefinitely please discontinue once patient no longer qualifies      Johny Pitstick 11/22/2018, 1:15 AM    [facility]     after 2 AM please page floor coverage PA If 7AM-7PM, please contact the day team taking care of the patient using [email or web address]

## 2018-11-22 NOTE — ED Triage Notes (Signed)
Pt comes to ed via pov, flu like symptoms. Weakness and nausea and diarrhea since Monday.

## 2018-11-22 NOTE — ED Notes (Signed)
Asked for urine on purwick

## 2018-11-23 ENCOUNTER — Encounter (HOSPITAL_COMMUNITY): Payer: Self-pay | Admitting: *Deleted

## 2018-11-23 ENCOUNTER — Inpatient Hospital Stay (HOSPITAL_COMMUNITY): Payer: Medicare Other

## 2018-11-23 ENCOUNTER — Other Ambulatory Visit: Payer: Self-pay

## 2018-11-23 DIAGNOSIS — G2581 Restless legs syndrome: Secondary | ICD-10-CM | POA: Diagnosis present

## 2018-11-23 DIAGNOSIS — J9601 Acute respiratory failure with hypoxia: Secondary | ICD-10-CM | POA: Diagnosis not present

## 2018-11-23 DIAGNOSIS — A419 Sepsis, unspecified organism: Secondary | ICD-10-CM | POA: Diagnosis present

## 2018-11-23 DIAGNOSIS — N179 Acute kidney failure, unspecified: Secondary | ICD-10-CM | POA: Diagnosis present

## 2018-11-23 DIAGNOSIS — R197 Diarrhea, unspecified: Secondary | ICD-10-CM | POA: Diagnosis present

## 2018-11-23 DIAGNOSIS — J449 Chronic obstructive pulmonary disease, unspecified: Secondary | ICD-10-CM | POA: Diagnosis not present

## 2018-11-23 DIAGNOSIS — J9621 Acute and chronic respiratory failure with hypoxia: Secondary | ICD-10-CM | POA: Diagnosis present

## 2018-11-23 DIAGNOSIS — Z841 Family history of disorders of kidney and ureter: Secondary | ICD-10-CM | POA: Diagnosis not present

## 2018-11-23 DIAGNOSIS — Z7951 Long term (current) use of inhaled steroids: Secondary | ICD-10-CM | POA: Diagnosis not present

## 2018-11-23 DIAGNOSIS — I959 Hypotension, unspecified: Secondary | ICD-10-CM | POA: Diagnosis present

## 2018-11-23 DIAGNOSIS — J13 Pneumonia due to Streptococcus pneumoniae: Secondary | ICD-10-CM | POA: Diagnosis present

## 2018-11-23 DIAGNOSIS — Z23 Encounter for immunization: Secondary | ICD-10-CM | POA: Diagnosis not present

## 2018-11-23 DIAGNOSIS — E860 Dehydration: Secondary | ICD-10-CM | POA: Diagnosis present

## 2018-11-23 DIAGNOSIS — F17210 Nicotine dependence, cigarettes, uncomplicated: Secondary | ICD-10-CM

## 2018-11-23 DIAGNOSIS — J440 Chronic obstructive pulmonary disease with acute lower respiratory infection: Secondary | ICD-10-CM | POA: Diagnosis present

## 2018-11-23 DIAGNOSIS — J189 Pneumonia, unspecified organism: Secondary | ICD-10-CM | POA: Diagnosis present

## 2018-11-23 DIAGNOSIS — E876 Hypokalemia: Secondary | ICD-10-CM | POA: Diagnosis present

## 2018-11-23 DIAGNOSIS — Z87891 Personal history of nicotine dependence: Secondary | ICD-10-CM | POA: Diagnosis not present

## 2018-11-23 DIAGNOSIS — J441 Chronic obstructive pulmonary disease with (acute) exacerbation: Secondary | ICD-10-CM | POA: Diagnosis present

## 2018-11-23 DIAGNOSIS — Z8249 Family history of ischemic heart disease and other diseases of the circulatory system: Secondary | ICD-10-CM | POA: Diagnosis not present

## 2018-11-23 DIAGNOSIS — J181 Lobar pneumonia, unspecified organism: Secondary | ICD-10-CM | POA: Diagnosis not present

## 2018-11-23 DIAGNOSIS — I10 Essential (primary) hypertension: Secondary | ICD-10-CM | POA: Diagnosis present

## 2018-11-23 LAB — COMPREHENSIVE METABOLIC PANEL
ALBUMIN: 2.9 g/dL — AB (ref 3.5–5.0)
ALK PHOS: 102 U/L (ref 38–126)
ALT: 9 U/L (ref 0–44)
AST: 15 U/L (ref 15–41)
Anion gap: 11 (ref 5–15)
BUN: 17 mg/dL (ref 8–23)
CO2: 24 mmol/L (ref 22–32)
Calcium: 7.6 mg/dL — ABNORMAL LOW (ref 8.9–10.3)
Chloride: 103 mmol/L (ref 98–111)
Creatinine, Ser: 1.27 mg/dL — ABNORMAL HIGH (ref 0.44–1.00)
GFR calc Af Amer: 52 mL/min — ABNORMAL LOW (ref >60)
GFR calc non Af Amer: 45 mL/min — ABNORMAL LOW (ref >60)
GLUCOSE: 136 mg/dL — AB (ref 70–99)
Potassium: 2.9 mmol/L — ABNORMAL LOW (ref 3.5–5.1)
Sodium: 138 mmol/L (ref 135–145)
Total Bilirubin: 1.7 mg/dL — ABNORMAL HIGH (ref 0.3–1.2)
Total Protein: 6.3 g/dL — ABNORMAL LOW (ref 6.5–8.1)

## 2018-11-23 LAB — CBC
HCT: 40 % (ref 36.0–46.0)
Hemoglobin: 12.3 g/dL (ref 12.0–15.0)
MCH: 28.6 pg (ref 26.0–34.0)
MCHC: 30.8 g/dL (ref 30.0–36.0)
MCV: 93 fL (ref 80.0–100.0)
PLATELETS: 233 10*3/uL (ref 150–400)
RBC: 4.3 MIL/uL (ref 3.87–5.11)
RDW: 13.6 % (ref 11.5–15.5)
WBC: 16.1 10*3/uL — ABNORMAL HIGH (ref 4.0–10.5)
nRBC: 0 % (ref 0.0–0.2)

## 2018-11-23 LAB — RESPIRATORY PANEL BY PCR

## 2018-11-23 LAB — PHOSPHORUS
Phosphorus: 2.2 mg/dL — ABNORMAL LOW (ref 2.5–4.6)
Phosphorus: 1.9 mg/dL — ABNORMAL LOW (ref 2.5–4.6)

## 2018-11-23 LAB — LACTATE DEHYDROGENASE: LDH: 140 U/L (ref 98–192)

## 2018-11-23 LAB — MRSA PCR SCREENING: MRSA by PCR: NEGATIVE

## 2018-11-23 LAB — URINALYSIS, ROUTINE W REFLEX MICROSCOPIC
Bilirubin Urine: NEGATIVE
GLUCOSE, UA: NEGATIVE mg/dL
Hgb urine dipstick: NEGATIVE
KETONES UR: NEGATIVE mg/dL
LEUKOCYTE UA: NEGATIVE
NITRITE: NEGATIVE
Protein, ur: NEGATIVE mg/dL
SPECIFIC GRAVITY, URINE: 1.005 (ref 1.005–1.030)
pH: 6 (ref 5.0–8.0)

## 2018-11-23 LAB — TSH: TSH: 2.424 u[IU]/mL (ref 0.350–4.500)

## 2018-11-23 LAB — BLOOD GAS, VENOUS
ACID-BASE EXCESS: 2.7 mmol/L — AB (ref 0.0–2.0)
Bicarbonate: 27.6 mmol/L (ref 20.0–28.0)
Drawn by: 11249
O2 Content: 2 L/min
O2 Saturation: 36.4 %
Patient temperature: 98.6
pCO2, Ven: 46.2 mmHg (ref 44.0–60.0)
pH, Ven: 7.394 (ref 7.250–7.430)

## 2018-11-23 LAB — HIV ANTIBODY (ROUTINE TESTING W REFLEX): HIV Screen 4th Generation wRfx: NONREACTIVE

## 2018-11-23 LAB — MAGNESIUM
Magnesium: 2 mg/dL (ref 1.7–2.4)
Magnesium: 2.1 mg/dL (ref 1.7–2.4)

## 2018-11-23 LAB — INFLUENZA PANEL BY PCR (TYPE A & B)
Influenza A By PCR: NEGATIVE
Influenza B By PCR: NEGATIVE

## 2018-11-23 LAB — SODIUM, URINE, RANDOM: Sodium, Ur: 27 mmol/L

## 2018-11-23 LAB — PROCALCITONIN: PROCALCITONIN: 0.75 ng/mL

## 2018-11-23 LAB — CREATININE, URINE, RANDOM: CREATININE, URINE: 87.03 mg/dL

## 2018-11-23 MED ORDER — SODIUM CHLORIDE 0.9 % IV SOLN
INTRAVENOUS | Status: AC
  Administered 2018-11-23 (×2): via INTRAVENOUS

## 2018-11-23 MED ORDER — POTASSIUM CHLORIDE 10 MEQ/100ML IV SOLN
10.0000 meq | INTRAVENOUS | Status: AC
  Administered 2018-11-23 (×5): 10 meq via INTRAVENOUS
  Filled 2018-11-23 (×5): qty 100

## 2018-11-23 MED ORDER — SODIUM CHLORIDE 0.9 % IV SOLN
1.0000 g | INTRAVENOUS | Status: DC
  Administered 2018-11-23 – 2018-11-24 (×2): 1 g via INTRAVENOUS
  Filled 2018-11-23 (×2): qty 1

## 2018-11-23 MED ORDER — SODIUM CHLORIDE 0.9 % IV BOLUS
1000.0000 mL | Freq: Once | INTRAVENOUS | Status: AC
  Administered 2018-11-23: 1000 mL via INTRAVENOUS

## 2018-11-23 MED ORDER — ALBUTEROL SULFATE (2.5 MG/3ML) 0.083% IN NEBU: 2.5000 mg | INHALATION_SOLUTION | Freq: Four times a day (QID) | RESPIRATORY_TRACT | Status: DC | PRN

## 2018-11-23 MED ORDER — MOMETASONE FURO-FORMOTEROL FUM 200-5 MCG/ACT IN AERO
2.0000 | INHALATION_SPRAY | Freq: Two times a day (BID) | RESPIRATORY_TRACT | Status: DC
  Administered 2018-11-23 – 2018-11-25 (×5): 2 via RESPIRATORY_TRACT
  Filled 2018-11-23: qty 8.8

## 2018-11-23 MED ORDER — PNEUMOCOCCAL VAC POLYVALENT 25 MCG/0.5ML IJ INJ
0.5000 mL | INJECTION | INTRAMUSCULAR | Status: AC
  Administered 2018-11-25: 0.5 mL via INTRAMUSCULAR
  Filled 2018-11-23: qty 0.5

## 2018-11-23 MED ORDER — GUAIFENESIN ER 600 MG PO TB12
600.0000 mg | ORAL_TABLET | Freq: Two times a day (BID) | ORAL | Status: DC
  Administered 2018-11-23 – 2018-11-25 (×5): 600 mg via ORAL
  Filled 2018-11-23 (×6): qty 1

## 2018-11-23 MED ORDER — GABAPENTIN 400 MG PO CAPS
1600.0000 mg | ORAL_CAPSULE | Freq: Every day | ORAL | Status: DC
  Administered 2018-11-23 – 2018-11-24 (×3): 1600 mg via ORAL
  Filled 2018-11-23 (×3): qty 4

## 2018-11-23 MED ORDER — SODIUM CHLORIDE 0.9 % IV SOLN
INTRAVENOUS | Status: DC
  Administered 2018-11-23: 19:00:00 via INTRAVENOUS

## 2018-11-23 MED ORDER — ENOXAPARIN SODIUM 40 MG/0.4ML ~~LOC~~ SOLN
40.0000 mg | Freq: Every day | SUBCUTANEOUS | Status: DC
  Administered 2018-11-23 – 2018-11-24 (×3): 40 mg via SUBCUTANEOUS
  Filled 2018-11-23 (×3): qty 0.4

## 2018-11-23 MED ORDER — MAGNESIUM SULFATE IN D5W 1-5 GM/100ML-% IV SOLN
1.0000 g | Freq: Once | INTRAVENOUS | Status: AC
  Administered 2018-11-23: 1 g via INTRAVENOUS
  Filled 2018-11-23: qty 100

## 2018-11-23 MED ORDER — ACETAMINOPHEN 325 MG PO TABS: 650.0000 mg | ORAL_TABLET | Freq: Four times a day (QID) | ORAL | Status: DC | PRN

## 2018-11-23 MED ORDER — SODIUM CHLORIDE 0.9 % IV SOLN
500.0000 mg | INTRAVENOUS | Status: DC
  Administered 2018-11-23 – 2018-11-24 (×2): 500 mg via INTRAVENOUS
  Filled 2018-11-23 (×2): qty 500

## 2018-11-23 MED ORDER — IPRATROPIUM BROMIDE 0.02 % IN SOLN
0.5000 mg | Freq: Four times a day (QID) | RESPIRATORY_TRACT | Status: DC
  Administered 2018-11-23 (×2): 0.5 mg via RESPIRATORY_TRACT
  Filled 2018-11-23 (×2): qty 2.5

## 2018-11-23 MED ORDER — VANCOMYCIN HCL 10 G IV SOLR
1500.0000 mg | Freq: Once | INTRAVENOUS | Status: AC
  Administered 2018-11-23: 1500 mg via INTRAVENOUS
  Filled 2018-11-23: qty 1500

## 2018-11-23 MED ORDER — HYDROCODONE-ACETAMINOPHEN 5-325 MG PO TABS
1.0000 | ORAL_TABLET | ORAL | Status: DC | PRN
  Administered 2018-11-23: 1 via ORAL
  Filled 2018-11-23: qty 1

## 2018-11-23 MED ORDER — ACETAMINOPHEN 650 MG RE SUPP: 650.0000 mg | Freq: Four times a day (QID) | RECTAL | Status: DC | PRN

## 2018-11-23 MED ORDER — VANCOMYCIN HCL IN DEXTROSE 1-5 GM/200ML-% IV SOLN: 1000.0000 mg | INTRAVENOUS | Status: DC

## 2018-11-23 MED ORDER — IPRATROPIUM-ALBUTEROL 0.5-2.5 (3) MG/3ML IN SOLN
3.0000 mL | Freq: Four times a day (QID) | RESPIRATORY_TRACT | Status: DC
  Administered 2018-11-23 – 2018-11-24 (×6): 3 mL via RESPIRATORY_TRACT
  Filled 2018-11-23 (×6): qty 3

## 2018-11-23 NOTE — Progress Notes (Signed)
   D/w [facility] MD Dr Tyson Babinski   - Morrell Riddle covid based on fact she has high wbc, procalc - up;  0.75, LDH is normal 140 - patient stable per [facility] MD - wil order urine strep and leg  Ccm will not do formal consult      SIGNATURE    Dr. Kalman Shan, M.D., F.C.C.P,  Pulmonary and Critical Care Medicine Staff Physician, [facility] Director - Interstitial Lung Disease  Program  Pulmonary Fibrosis [facility] [place], [place], [address]  Pager: [phone], If no answer or between  15:00h - 7:00h: call [phone] Telephone: [phone]  12:39 PM 11/23/2018

## 2018-11-23 NOTE — ED Notes (Signed)
ED TO INPATIENT HANDOFF REPORT  ED Nurse Name and Phone #: jon rn wled   S Name/Age/Gender [name] 63 y.o. female Room/Bed: WA03/WA03  Code Status   Code Status: Prior  Home/SNF/Other Home Patient oriented to: self, place, time and situation Is this baseline? Yes   Triage Complete: Triage complete  Chief Complaint multiple symptoms  Triage Note Pt comes to ed via pov, flu like symptoms. Weakness and nausea and diarrhea since Monday.    Allergies No Known Allergies  Level of Care/Admitting Diagnosis ED Disposition    ED Disposition Condition Comment   Admit  Hospital Area: [facility] [[id number]]  Level of Care: Stepdown [14]  Admit to SDU based on following criteria: Hemodynamic compromise or significant risk of instability:  Patient requiring short term acute titration and management of vasoactive drips, and invasive monitoring (i.e., CVP and Arterial line).  Diagnosis: Acute respiratory failure with hypoxia [facility]) [[id number]]  Admitting Physician: Therisa Doyne [[id number]]  Attending Physician: Therisa Doyne [[id number]]  Estimated length of stay: 3 - 4 days  Certification:: I certify this patient will need inpatient services for at least 2 midnights  PT Class (Do Not Modify): Inpatient [101]  PT Acc Code (Do Not Modify): Private [1]       B Medical/Surgery History Past Medical History:  Diagnosis Date  . Asthma   . Hypertension   . Nocturnal leg cramps 06/22/2015  . RLS (restless legs syndrome) 06/22/2015  . Thyroid disease    Past Surgical History:  Procedure Laterality Date  . ABDOMINAL HYSTERECTOMY    . APPENDECTOMY       A IV Location/Drains/Wounds Patient Lines/Drains/Airways Status   Active Line/Drains/Airways    Name:   Placement date:   Placement time:   Site:   Days:   Peripheral IV 11/22/18 Right Wrist   11/22/18    2149    Wrist   1   Peripheral IV 11/22/18 Left Hand   11/22/18    2250    Hand   1           Intake/Output Last 24 hours No intake or output data in the 24 hours ending 11/23/18 5397  Labs/Imaging Results for orders placed or performed during the hospital encounter of 11/22/18 (from the past 48 hour(s))  Lactic acid, plasma     Status: None   Collection Time: 11/22/18  9:12 PM  Result Value Ref Range   Lactic Acid, Venous 1.0 0.5 - 1.9 mmol/L    Comment: Performed at [facility], [address], [place], [place] [address]  Comprehensive metabolic panel     Status: Abnormal   Collection Time: 11/22/18  9:12 PM  Result Value Ref Range   Sodium 134 (L) 135 - 145 mmol/L   Potassium 2.7 (LL) 3.5 - 5.1 mmol/L    Comment: CRITICAL RESULT CALLED TO, READ BACK BY AND VERIFIED WITH: TALKINGTON,J RN @2253  ON 11/22/2018 JACKSON,K    Chloride 96 (L) 98 - 111 mmol/L   CO2 26 22 - 32 mmol/L   Glucose, Bld 146 (H) 70 - 99 mg/dL   BUN 21 8 - 23 mg/dL   Creatinine, Ser 9.37 (H) 0.44 - 1.00 mg/dL   Calcium 8.4 (L) 8.9 - 10.3 mg/dL   Total Protein 6.7 6.5 - 8.1 g/dL   Albumin 3.1 (L) 3.5 - 5.0 g/dL   AST 15 15 - 41 U/L   ALT 10 0 - 44 U/L   Alkaline Phosphatase 98 38 -  126 U/L   Total Bilirubin 2.5 (H) 0.3 - 1.2 mg/dL   GFR calc non Af Amer 32 (L) >60 mL/min   GFR calc Af Amer 37 (L) >60 mL/min   Anion gap 12 5 - 15    Comment: Performed at [facility], [address] [place], [place] [address]  CBC with Differential     Status: Abnormal   Collection Time: 11/22/18  9:12 PM  Result Value Ref Range   WBC 21.0 (H) 4.0 - 10.5 K/uL   RBC 4.22 3.87 - 5.11 MIL/uL   Hemoglobin 12.3 12.0 - 15.0 g/dL   HCT 78.237.7 95.636.0 - 21.346.0 %   MCV 89.3 80.0 - 100.0 fL   MCH 29.1 26.0 - 34.0 pg   MCHC 32.6 30.0 - 36.0 g/dL   RDW 08.613.5 57.811.5 - 46.915.5 %   Platelets 236 150 - 400 K/uL   nRBC 0.0 0.0 - 0.2 %   Neutrophils Relative % 82 %   Neutro Abs 17.4 (H) 1.7 - 7.7 K/uL   Lymphocytes Relative 11 %   Lymphs Abs 2.2 0.7 - 4.0 K/uL   Monocytes Relative 5 %    Monocytes Absolute 1.0 0.1 - 1.0 K/uL   Eosinophils Relative 0 %   Eosinophils Absolute 0.0 0.0 - 0.5 K/uL   Basophils Relative 0 %   Basophils Absolute 0.1 0.0 - 0.1 K/uL   Immature Granulocytes 2 %   Abs Immature Granulocytes 0.36 (H) 0.00 - 0.07 K/uL    Comment: Performed at [facility], [address] [place], [place] [address]  Lactic acid, plasma     Status: None   Collection Time: 11/22/18 11:12 PM  Result Value Ref Range   Lactic Acid, Venous 1.0 0.5 - 1.9 mmol/L    Comment: Performed at [facility], [address] [place], [place] [address]   Dg Chest 2 View  Result Date: 11/22/2018 CLINICAL DATA:  Flu-like symptoms. Weakness, nausea, and diarrhea since Monday. Previous smoker. EXAM: CHEST - 2 VIEW COMPARISON:  03/02/2016 FINDINGS: Shallow inspiration. Heart size and pulmonary vascularity are normal. Infiltration in the left upper lung and perihilar region likely representing pneumonia. Follow-up after resolution of acute process is recommended to exclude underlying lesion. Right lung is clear. No blunting of costophrenic angles. No pneumothorax. IMPRESSION: Left upper lung pneumonia. Followup PA and lateral chest X-ray is recommended in 3-4 weeks following appropriate clinical therapy to ensure resolution and exclude underlying malignancy. Electronically Signed   By: Burman NievesWilliam  Stevens M.D.   On: 11/22/2018 21:38    Pending Labs Unresulted Labs (From admission, onward)    Start     Ordered   11/22/18 2353  Phosphorus  Add-on,   R     11/22/18 2352   11/22/18 2353  Respiratory Panel by PCR  (Respiratory virus panel with precautions)  Once,   R     11/22/18 2353   11/22/18 2353  Gastrointestinal Panel by PCR , Stool  (Gastrointestinal Panel by PCR, Stool)  Once,   R     11/22/18 2353   11/22/18 2352  Magnesium  Add-on,   R     11/22/18 2352   11/22/18 2352  Lactate dehydrogenase  Add-on,   R     11/22/18 2352   11/22/18 2217  Blood  Culture (routine x 2)  BLOOD CULTURE X 2,   STAT     11/22/18 2218   11/22/18 2112  Urinalysis, Routine w reflex microscopic  ONCE - STAT,   STAT     11/22/18 2112   Signed and Held  HIV antibody (Routine Screening)  Tomorrow morning,   R     Signed and Held   Signed and Held  Culture, sputum-assessment  Once,   R    Question:  Patient immune status  Answer:  Normal   Signed and Held   Signed and Held  Gram stain  Once,   R    Question:  Patient immune status  Answer:  Normal   Signed and Held   Signed and Held  Strep pneumoniae urinary antigen  Once,   R     Signed and Held   Signed and Held  Legionella Pneumophila Serogp 1 Ur Ag  Once,   R     Signed and Held   Signed and Held  Influenza panel by PCR (type A & B)  (Influenza PCR Panel)  Once,   R     Signed and Held   Signed and Held  Procalcitonin  Add-on,   R     Signed and Held   Signed and Held  Magnesium  Tomorrow morning,   R    Comments:  Call MD if <1.5    Signed and Held   Signed and Held  Phosphorus  Tomorrow morning,   R     Signed and Held   Signed and Held  TSH  Once,   R    Comments:  Cancel if already done within 1 month and notify MD    Signed and Held   Signed and Held  Comprehensive metabolic panel  Once,   R    Comments:  Cal MD for K<3.5 or >5.0    Signed and Held   Signed and Held  CBC  Once,   R    Comments:  Call for hg <8.0    Signed and Held          Vitals/Pain Today's Vitals   11/22/18 2315 11/22/18 2330 11/22/18 2345 11/23/18 0000  BP: 111/65 100/68 106/64 102/73  Pulse: 61 64 68 66  Resp: (!) 25 19 (!) 25 (!) 29  Temp:      TempSrc:      SpO2: (!) 87% (!) 88% (!) 88% (!) 89%  Weight:      Height:      PainSc:        Isolation Precautions Enteric precautions (UV disinfection)  Medications Medications  cefTRIAXone (ROCEPHIN) 2 g in sodium chloride 0.9 % 100 mL IVPB (0 g Intravenous Stopped 11/22/18 2327)  azithromycin (ZITHROMAX) 500 mg in sodium chloride 0.9 % 250 mL IVPB  (500 mg Intravenous New Bag/Given 11/22/18 2329)  potassium chloride 10 mEq in 100 mL IVPB (has no administration in time range)  sodium chloride 0.9 % bolus 1,000 mL (0 mLs Intravenous Stopped 11/22/18 2327)  sodium chloride 0.9 % bolus 1,000 mL (1,000 mLs Intravenous New Bag/Given 11/22/18 2254)    Mobility walks Low fall risk   Focused Assessments Pulmonary Assessment Handoff:  Lung sounds: L Breath Sounds: Diminished R Breath Sounds: Diminished O2 Device: Room Air        R Recommendations: See Admitting Provider Note  Report given to:   Additional Notes:

## 2018-11-23 NOTE — Progress Notes (Signed)
eLink Physician-Brief Progress Note Patient Name: [name] DOB: 1955/11/29 MRN: [id number]   Date of Service  11/23/2018  HPI/Events of Note  62/F with hx of COPD, HTN , presenting to the hospital with 3 day hx of cough, shortness of breath, myalgia and diarrhea.  Her daughter had similar symptoms over the past week and is already getting better. The patient has not had any travel.  She does go to church.    The patient has LUL infiltrate. Wbc 21, H&H 12.3/37.7, platelets 236   eICU Interventions  Agree with azithromycin and ceftriaxone as discussed with Dr. Adela Glimpse.  Check flu, respiratory viral panel, sputum culture, strep and legionella. No steroids other than inhaled steroids in [place].   Neb treatments around the clock.  Potassium repleted.  Lovenox for DVT prophylaxis.  No indication for GI prophylaxis.       Intervention Category Evaluation Type: New Patient Evaluation  Larinda Buttery 11/23/2018, 2:15 AM

## 2018-11-23 NOTE — Progress Notes (Signed)
PROGRESS NOTE  [name] SFK:[id number] DOB: 07-Feb-1956 DOA: 11/22/2018 PCP: Grayce Sessions, NP   LOS: 0 days   Brief narrative: [name] is a 63 y.o. female with medical history significant of COPD, hypertension, restless leg syndrome, Graves disease, nocturnal hypoxemia with oxygen 2 L/min at home presented to the hospital with flulike symptoms including weakness, nausea and diarrhea for 5 days with generalized myalgia and subjective fever and shortness of breath at home.  Patient also complains of wheezing cough despite using her home nebulizers.  Patient did have a daughter who had similar symptoms last week.   Subjective:  Complains of feeling little better with dyspnea today.  Denies any nausea vomiting or diarrhea today.  Has not had a bowel movement since yesterday.  Assessment/Plan:  Active Problems:   COPD (chronic obstructive pulmonary disease) (HCC)   Nicotine dependence   Acute respiratory failure with hypoxia (HCC)   AKI (acute kidney injury) (HCC)   CAP (community acquired pneumonia)   Sepsis (HCC)   Diarrhea   Dehydration   Hypokalemia  Community-acquired pneumonia.  Respiratory viral panel was negative.  Flu swab was negative.  Continue antibiotics with Rocephin and Zithromax.  Calcitonin was 0.7.  Hypotension likely secondary to volume depletion.  Will give 1 L IV fluid bolus this morning.  Continue to monitor closely.  Hypophosphatemia.  We will replace with potassium phosphate.  Hypokalemia.  We will continue to replenish aggressively.   Acute on chronic respiratory failure with hypoxia- due to large LUL PNA.  Continue Rocephin and Zithromax.  Follow-up blood cultures.  Follow-up Legionella and strep urinary antigen.  Continue oxygen, nebulizers, Supportive care.  Acute kidney injury likely secondary to volume depletion.  Patient received IV fluid hydration with improvement in renal function.  Will consider discontinuation of IV fluids by  tomorrow.  COPD exacerbation likely secondary to pneumonia.  Continue on oxygen nebulizers and antibiotics for now.   Nicotine dependence - now in remission  Sepsis secondary to pneumonia..  Patient made evidence of sirs criteria on presentation with acute kidney injury and hypoxia..   - await results of blood and sputum culture.  Lactic acid was within normal limits. Rehydrate aggressively  Diarrhea - will rehydrate, no further bowel movements.  VTE Prophylaxis: Lovenox  Code Status: Full code  Family Communication: No one is available at bedside  Disposition Plan: Home likely in 2 to 3 days.  Continue in the stepdown unit due to hypotension and respiratory failure.  Consultants:  None  Procedures:  None  Antibiotics: Anti-infectives (From admission, onward)   Start     Dose/Rate Route Frequency Ordered Stop   11/24/18 1800  vancomycin (VANCOCIN) IVPB 1000 mg/200 mL premix     1,000 mg 200 mL/hr over 60 Minutes Intravenous Every 36 hours 11/23/18 0159     11/23/18 2200  cefTRIAXone (ROCEPHIN) 1 g in sodium chloride 0.9 % 100 mL IVPB     1 g 200 mL/hr over 30 Minutes Intravenous Every 24 hours 11/23/18 0138 11/30/18 2159   11/23/18 2200  azithromycin (ZITHROMAX) 500 mg in sodium chloride 0.9 % 250 mL IVPB     500 mg 250 mL/hr over 60 Minutes Intravenous Every 24 hours 11/23/18 0138 11/30/18 2159   11/23/18 0200  vancomycin (VANCOCIN) 1,500 mg in sodium chloride 0.9 % 500 mL IVPB     1,500 mg 250 mL/hr over 120 Minutes Intravenous  Once 11/23/18 0158 11/23/18 0445   11/22/18 2230  cefTRIAXone (ROCEPHIN) 2 g in sodium  chloride 0.9 % 100 mL IVPB  Status:  Discontinued     2 g 200 mL/hr over 30 Minutes Intravenous Every 24 hours 11/22/18 2218 11/23/18 0155   11/22/18 2230  azithromycin (ZITHROMAX) 500 mg in sodium chloride 0.9 % 250 mL IVPB  Status:  Discontinued     500 mg 250 mL/hr over 60 Minutes Intravenous Every 24 hours 11/22/18 2218 11/23/18 0155       Objective: Vitals:   11/23/18 0800 11/23/18 0815  BP: (!) 76/43 (!) 85/46  Pulse: 71 75  Resp: (!) 27 (!) 21  Temp: 98.4 F (36.9 C)   SpO2: 96% 98%    Intake/Output Summary (Last 24 hours) at 11/23/2018 1040 Last data filed at 11/23/2018 0912 Gross per 24 hour  Intake 1810.56 ml  Output 700 ml  Net 1110.56 ml   Filed Weights   11/22/18 2026 11/23/18 0139  Weight: 83.9 kg 91.9 kg   Body mass index is 33.71 kg/m.   Physical Exam: GENERAL: Patient is alert awake and oriented. Not in obvious distress.  Nasal cannula oxygen. HENT: No scleral pallor or icterus. Pupils equally reactive to light. Oral mucosa is dry. NECK: is supple, no palpable thyroid enlargement. CHEST: Diminished breath sounds bilaterally.  No overt wheezes noted. CVS: S1 and S2 heard, no murmur. Regular rate and rhythm. No pericardial rub. ABDOMEN: Soft, non-tender, bowel sounds are present. No palpable hepato-splenomegaly. EXTREMITIES: No edema. CNS: Cranial nerves are intact. No focal motor or sensory deficits. SKIN: warm and dry without rashes.  Decreased skin turgor.  Data Review: I have personally reviewed the following laboratory data and studies,  CBC: Recent Labs  Lab 11/22/18 2112 11/23/18 0325  WBC 21.0* 16.1*  NEUTROABS 17.4*  --   HGB 12.3 12.3  HCT 37.7 40.0  MCV 89.3 93.0  PLT 236 233   Basic Metabolic Panel: Recent Labs  Lab 11/22/18 2112 11/23/18 0144 11/23/18 0325  NA 134*  --  138  K 2.7*  --  2.9*  CL 96*  --  103  CO2 26  --  24  GLUCOSE 146*  --  136*  BUN 21  --  17  CREATININE 1.71*  --  1.27*  CALCIUM 8.4*  --  7.6*  MG  --  2.0 2.1  PHOS  --  2.2* 1.9*   Liver Function Tests: Recent Labs  Lab 11/22/18 2112 11/23/18 0325  AST 15 15  ALT 10 9  ALKPHOS 98 102  BILITOT 2.5* 1.7*  PROT 6.7 6.3*  ALBUMIN 3.1* 2.9*   No results for input(s): LIPASE, AMYLASE in the last 168 hours. No results for input(s): AMMONIA in the last 168 hours. Cardiac Enzymes: No  results for input(s): CKTOTAL, CKMB, CKMBINDEX, TROPONINI in the last 168 hours. BNP (last 3 results) No results for input(s): BNP in the last 8760 hours.  ProBNP (last 3 results) No results for input(s): PROBNP in the last 8760 hours.  CBG: No results for input(s): GLUCAP in the last 168 hours. Recent Results (from the past 240 hour(s))  Blood Culture (routine x 2)     Status: None (Preliminary result)   Collection Time: 11/22/18 10:22 PM  Result Value Ref Range Status   Specimen Description   Final    BLOOD RIGHT WRIST Performed at [facility], [address] [place], [place] [address]    Special Requests   Final    BOTTLES DRAWN AEROBIC AND ANAEROBIC Blood Culture adequate volume Performed at [facility], [address], [place], [place] [address]    Culture PENDING  Incomplete   Report Status PENDING  Incomplete  Respiratory Panel by PCR     Status: None   Collection Time: 11/23/18 12:45 AM  Result Value Ref Range Status   Adenovirus NOT DETECTED NOT DETECTED Final   Coronavirus 229E NOT DETECTED NOT DETECTED Final    Comment: (NOTE) The Coronavirus on the Respiratory Panel, DOES NOT test for the novel  Coronavirus (2019 nCoV)    Coronavirus HKU1 NOT DETECTED NOT DETECTED Final   Coronavirus NL63 NOT DETECTED NOT DETECTED Final   Coronavirus OC43 NOT DETECTED NOT DETECTED Final   Metapneumovirus NOT DETECTED NOT DETECTED Final   Rhinovirus / Enterovirus NOT DETECTED NOT DETECTED Final   Influenza A NOT DETECTED NOT DETECTED Final   Influenza B NOT DETECTED NOT DETECTED Final   Parainfluenza Virus 1 NOT DETECTED NOT DETECTED Final   Parainfluenza Virus 2 NOT DETECTED NOT DETECTED Final   Parainfluenza Virus 3 NOT DETECTED NOT DETECTED Final   Parainfluenza Virus 4 NOT DETECTED NOT DETECTED Final   Respiratory Syncytial Virus NOT DETECTED NOT DETECTED Final   Bordetella pertussis NOT DETECTED NOT DETECTED Final   Chlamydophila pneumoniae NOT  DETECTED NOT DETECTED Final   Mycoplasma pneumoniae NOT DETECTED NOT DETECTED Final    Comment: Performed at [facility], [address] [place], [place] [address]  MRSA PCR Screening     Status: None   Collection Time: 11/23/18  1:30 AM  Result Value Ref Range Status   MRSA by PCR NEGATIVE NEGATIVE Final    Comment:        The GeneXpert MRSA Assay (FDA approved for NASAL specimens only), is one component of a comprehensive MRSA colonization surveillance program. It is not intended to diagnose MRSA infection nor to guide or monitor treatment for MRSA infections. Performed at [facility], [address] [place], [place] [address]      Studies: Dg Chest 2 View  Result Date: 11/22/2018 CLINICAL DATA:  Flu-like symptoms. Weakness, nausea, and diarrhea since Monday. Previous smoker. EXAM: CHEST - 2 VIEW COMPARISON:  03/02/2016 FINDINGS: Shallow inspiration. Heart size and pulmonary vascularity are normal. Infiltration in the left upper lung and perihilar region likely representing pneumonia. Follow-up after resolution of acute process is recommended to exclude underlying lesion. Right lung is clear. No blunting of costophrenic angles. No pneumothorax. IMPRESSION: Left upper lung pneumonia. Followup PA and lateral chest X-ray is recommended in 3-4 weeks following appropriate clinical therapy to ensure resolution and exclude underlying malignancy. Electronically Signed   By: Burman Nieves M.D.   On: 11/22/2018 21:38   Ct Chest Wo Contrast  Result Date: 11/23/2018 CLINICAL DATA:  Pneumonia and dyspnea. EXAM: CT CHEST WITHOUT CONTRAST TECHNIQUE: Multidetector CT imaging of the chest was performed following the standard protocol without IV contrast. COMPARISON:  08/30/2015 FINDINGS: Cardiovascular: Normal heart size. No pericardial effusion. Coronary artery and aortic calcifications. Normal caliber thoracic aorta. Mediastinum/Nodes: Esophagus is decompressed. No  significant lymphadenopathy in the chest. Lungs/Pleura: Left upper lung consolidation with air bronchograms consistent with pneumonia. Right lung is clear and expanded. No pleural effusions. No pneumothorax. Airways are patent. Upper Abdomen: No acute abnormalities demonstrated. Musculoskeletal: Degenerative changes in the spine. No destructive bone lesions. IMPRESSION: Left upper lung consolidation with air bronchograms consistent with pneumonia. Followup PA and lateral chest X-ray is recommended in 3-4 weeks following appropriate clinical therapy to ensure resolution and exclude underlying  malignancy. Electronically Signed   By: Burman Nieves M.D.   On: 11/23/2018 00:53    Scheduled Meds: . enoxaparin (LOVENOX) injection  40 mg Subcutaneous QHS  . gabapentin  1,600 mg Oral QHS  . guaiFENesin  600 mg Oral BID  . ipratropium-albuterol  3 mL Nebulization Q6H  . mometasone-formoterol  2 puff Inhalation BID  . [START ON 11/24/2018] pneumococcal 23 valent vaccine  0.5 mL Intramuscular Tomorrow-1000    Continuous Infusions: . sodium chloride 125 mL/hr at 11/23/18 0912  . azithromycin    . cefTRIAXone (ROCEPHIN)  IV    . potassium chloride 10 mEq (11/23/18 1021)  . [START ON 11/24/2018] vancomycin       Joycelyn Das, MD  [facility] 11/23/2018

## 2018-11-23 NOTE — Progress Notes (Signed)
Pharmacy Antibiotic Note  Kynnedi Der is a 63 y.o. female admitted on 11/22/2018 with sepsis.  Pharmacy has been consulted for Vancomycin dosing.  Plan: Vancomycin 1500mg  iv x1, then Vancomycin 1000 mg IV Q 36 hrs. Goal AUC 400-550. Expected AUC: 485 SCr used: 1.71   Height: 5\' 5"  (165.1 cm) Weight: 202 lb 9.6 oz (91.9 kg) IBW/kg (Calculated) : 57  Temp (24hrs), Avg:98 F (36.7 C), Min:98 F (36.7 C), Max:98 F (36.7 C)  Recent Labs  Lab 11/22/18 2112 11/22/18 2312 11/23/18 0325  WBC 21.0*  --  16.1*  CREATININE 1.71*  --   --   LATICACIDVEN 1.0 1.0  --     Estimated Creatinine Clearance: 38.2 mL/min (A) (by C-G formula based on SCr of 1.71 mg/dL (H)).    No Known Allergies  Antimicrobials this admission: Ceftriaxone 11/22/2018 >> Azithromycin 11/22/2018 >> Vancomycin 11/23/2018 >>  Dose adjustments this admission: -  Microbiology results: -  Thank you for allowing pharmacy to be a part of this patient's care.  Aleene Davidson Crowford 11/23/2018 4:19 AM

## 2018-11-24 ENCOUNTER — Encounter (HOSPITAL_COMMUNITY): Payer: Self-pay

## 2018-11-24 LAB — BASIC METABOLIC PANEL
Anion gap: 9 (ref 5–15)
BUN: 10 mg/dL (ref 8–23)
CHLORIDE: 108 mmol/L (ref 98–111)
CO2: 24 mmol/L (ref 22–32)
Calcium: 7.8 mg/dL — ABNORMAL LOW (ref 8.9–10.3)
Creatinine, Ser: 0.92 mg/dL (ref 0.44–1.00)
GFR calc Af Amer: >60 mL/min (ref >60)
GFR calc non Af Amer: >60 mL/min (ref >60)
Glucose, Bld: 99 mg/dL (ref 70–99)
Potassium: 3.1 mmol/L — ABNORMAL LOW (ref 3.5–5.1)
Sodium: 141 mmol/L (ref 135–145)

## 2018-11-24 LAB — CBC
HCT: 32.6 % — ABNORMAL LOW (ref 36.0–46.0)
Hemoglobin: 10.3 g/dL — ABNORMAL LOW (ref 12.0–15.0)
MCH: 28.9 pg (ref 26.0–34.0)
MCHC: 31.6 g/dL (ref 30.0–36.0)
MCV: 91.3 fL (ref 80.0–100.0)
NRBC: 0 % (ref 0.0–0.2)
Platelets: 240 10*3/uL (ref 150–400)
RBC: 3.57 MIL/uL — ABNORMAL LOW (ref 3.87–5.11)
RDW: 14.1 % (ref 11.5–15.5)
WBC: 13.8 10*3/uL — ABNORMAL HIGH (ref 4.0–10.5)

## 2018-11-24 LAB — LEGIONELLA PNEUMOPHILA SEROGP 1 UR AG: L. pneumophila Serogp 1 Ur Ag: NEGATIVE

## 2018-11-24 LAB — STREP PNEUMONIAE URINARY ANTIGEN: Strep Pneumo Urinary Antigen: POSITIVE — AB

## 2018-11-24 LAB — MAGNESIUM: Magnesium: 2.3 mg/dL (ref 1.7–2.4)

## 2018-11-24 MED ORDER — SODIUM CHLORIDE 0.9 % IV SOLN
INTRAVENOUS | Status: DC | PRN
  Administered 2018-11-24: 250 mL via INTRAVENOUS

## 2018-11-24 NOTE — Progress Notes (Signed)
Pt transferred to 1601. Left unit in wheelchair pushed by this Clinical research associate. Left instable condition. VWilliams,RN.

## 2018-11-24 NOTE — Progress Notes (Signed)
Patient had a formed brown stool.

## 2018-11-24 NOTE — Progress Notes (Signed)
PROGRESS NOTE  [name] QAS:[id number] DOB: 15-Feb-1956 DOA: 11/22/2018 PCP: Grayce Sessions, NP   LOS: 1 day   Brief narrative: [name] is a 63 y.o. female with medical history significant of COPD, hypertension, restless leg syndrome, Graves disease, nocturnal hypoxemia with oxygen 2 L/min at home presented to the hospital with flulike symptoms including weakness, nausea and diarrhea for 5 days with generalized myalgia and subjective fever and shortness of breath at home.  Patient also complained of wheezing cough despite using her home nebulizers.  Patient did have a daughter who had similar symptoms last week.   Subjective:  Patient continues to feel better today.  Has less dyspnea.  Has dry cough.  Denies any chest pain palpitation fever or chills.  Assessment/Plan:  Active Problems:   COPD (chronic obstructive pulmonary disease) (HCC)   Nicotine dependence   Acute respiratory failure with hypoxia (HCC)   AKI (acute kidney injury) (HCC)   CAP (community acquired pneumonia)   Sepsis (HCC)   Diarrhea   Dehydration   Hypokalemia  Left upper lobe community-acquired pneumonia secondary to Streptococcus pneumoniae.  Respiratory viral panel was negative but urinary strep antigen was positive.  Flu swab was negative.  Continue antibiotics with Rocephin and Zithromax.  procalcitonin was 0.7.  Continue treatment with current regimen.  Patient is currently on nasal cannula at 2 L/min.  Stable for transfer out of the intensive care unit.  Cytosis has improved  Hypotension likely secondary to volume depletion.  Improved with IV fluid hydration.  Discontinue IV fluids today.  Hypophosphatemia.  Continue to replenish with potassium phosphate.  Hypokalemia.  Continue to replenish as necessary.  Check labs from today.  Acute on chronic respiratory failure with hypoxia- due to large LUL PNA.  Continue Rocephin and Zithromax.  Follow-up blood cultures.  Follow-up Legionella and  strep urinary antigen.  Continue oxygen, nebulizers, Supportive care.  Acute kidney injury likely secondary to volume depletion.  Patient received IV fluid hydration with improvement in renal function.  Discontinue IV fluids today. COPD exacerbation likely secondary to pneumonia.  Continue on oxygen nebulizers and antibiotics for now.  Avoid wheezing.  Avoid steroids for now.  Nicotine dependence - now in remission  Sepsis secondary to pneumonia.  Patient made evidence of SIRS criteria on presentation with acute kidney injury and hypoxia.Marland Kitchen await results of blood and sputum culture.  Lactic acid was within normal limits.   Diarrhea - resolved spontaneously.  VTE Prophylaxis: Lovenox  Code Status: Full code  Family Communication: No one is available at bedside  Disposition Plan: Home likely in 1 to 2 days.  Transfer out of the stepdown unit today.  Consultants:  None  Procedures:  None  Antibiotics: Anti-infectives (From admission, onward)   Start     Dose/Rate Route Frequency Ordered Stop   11/24/18 1800  vancomycin (VANCOCIN) IVPB 1000 mg/200 mL premix  Status:  Discontinued     1,000 mg 200 mL/hr over 60 Minutes Intravenous Every 36 hours 11/23/18 0159 11/23/18 1140   11/23/18 2200  cefTRIAXone (ROCEPHIN) 1 g in sodium chloride 0.9 % 100 mL IVPB     1 g 200 mL/hr over 30 Minutes Intravenous Every 24 hours 11/23/18 0138 11/30/18 2159   11/23/18 2200  azithromycin (ZITHROMAX) 500 mg in sodium chloride 0.9 % 250 mL IVPB     500 mg 250 mL/hr over 60 Minutes Intravenous Every 24 hours 11/23/18 0138 11/30/18 2159   11/23/18 0200  vancomycin (VANCOCIN) 1,500 mg in sodium chloride  0.9 % 500 mL IVPB     1,500 mg 250 mL/hr over 120 Minutes Intravenous  Once 11/23/18 0158 11/23/18 0445   11/22/18 2230  cefTRIAXone (ROCEPHIN) 2 g in sodium chloride 0.9 % 100 mL IVPB  Status:  Discontinued     2 g 200 mL/hr over 30 Minutes Intravenous Every 24 hours 11/22/18 2218 11/23/18 0155    11/22/18 2230  azithromycin (ZITHROMAX) 500 mg in sodium chloride 0.9 % 250 mL IVPB  Status:  Discontinued     500 mg 250 mL/hr over 60 Minutes Intravenous Every 24 hours 11/22/18 2218 11/23/18 0155      Objective: Vitals:   11/24/18 0400 11/24/18 0800  BP: 111/65 120/67  Pulse: 90 60  Resp: (!) 24 14  Temp: 98.1 F (36.7 C) 98.3 F (36.8 C)  SpO2: 97% 98%    Intake/Output Summary (Last 24 hours) at 11/24/2018 0813 Last data filed at 11/24/2018 0500 Gross per 24 hour  Intake 2455.63 ml  Output 2400 ml  Net 55.63 ml   Filed Weights   11/22/18 2026 11/23/18 0139  Weight: 83.9 kg 91.9 kg   Body mass index is 33.71 kg/m.   Physical Exam: General:  Average built, not in obvious distress.  On nasal cannula oxygen at 2 L/min. HENT: Normocephalic, pupils equally reacting to light and accommodation.  No scleral pallor or icterus noted. Oral mucosa is moist.  Chest: Diminished breath sounds bilaterally. CVS: S1 &S2 heard. No murmur.  Regular rate and rhythm. Abdomen: Soft, nontender, nondistended.  Bowel sounds are heard.  Liver is not palpable, no abdominal mass palpated Extremities: No cyanosis, clubbing or edema.  Peripheral pulses are palpable. Psych: Alert, awake and oriented, normal mood CNS:  No cranial nerve deficits.  Power equal in all extremities.  No sensory deficits noted.  No cerebellar signs.   Skin: Warm and dry.  No rashes noted.   Data Review: I have personally reviewed the following laboratory data and studies,  CBC: Recent Labs  Lab 11/22/18 2112 11/23/18 0325  WBC 21.0* 16.1*  NEUTROABS 17.4*  --   HGB 12.3 12.3  HCT 37.7 40.0  MCV 89.3 93.0  PLT 236 233   Basic Metabolic Panel: Recent Labs  Lab 11/22/18 2112 11/23/18 0144 11/23/18 0325  NA 134*  --  138  K 2.7*  --  2.9*  CL 96*  --  103  CO2 26  --  24  GLUCOSE 146*  --  136*  BUN 21  --  17  CREATININE 1.71*  --  1.27*  CALCIUM 8.4*  --  7.6*  MG  --  2.0 2.1  PHOS  --  2.2* 1.9*    Liver Function Tests: Recent Labs  Lab 11/22/18 2112 11/23/18 0325  AST 15 15  ALT 10 9  ALKPHOS 98 102  BILITOT 2.5* 1.7*  PROT 6.7 6.3*  ALBUMIN 3.1* 2.9*   No results for input(s): LIPASE, AMYLASE in the last 168 hours. No results for input(s): AMMONIA in the last 168 hours. Cardiac Enzymes: No results for input(s): CKTOTAL, CKMB, CKMBINDEX, TROPONINI in the last 168 hours. BNP (last 3 results) No results for input(s): BNP in the last 8760 hours.  ProBNP (last 3 results) No results for input(s): PROBNP in the last 8760 hours.  CBG: No results for input(s): GLUCAP in the last 168 hours. Recent Results (from the past 240 hour(s))  Blood Culture (routine x 2)     Status: None (Preliminary result)  Collection Time: 11/22/18 10:22 PM  Result Value Ref Range Status   Specimen Description   Final    BLOOD RIGHT WRIST Performed at [facility], [address] [place], [place] [address]    Special Requests   Final    BOTTLES DRAWN AEROBIC AND ANAEROBIC Blood Culture adequate volume Performed at [facility], [address], [place], [place] [address]    Culture PENDING  Incomplete   Report Status PENDING  Incomplete  Respiratory Panel by PCR     Status: None   Collection Time: 11/23/18 12:45 AM  Result Value Ref Range Status   Adenovirus NOT DETECTED NOT DETECTED Final   Coronavirus 229E NOT DETECTED NOT DETECTED Final    Comment: (NOTE) The Coronavirus on the Respiratory Panel, DOES NOT test for the novel  Coronavirus (2019 nCoV)    Coronavirus HKU1 NOT DETECTED NOT DETECTED Final   Coronavirus NL63 NOT DETECTED NOT DETECTED Final   Coronavirus OC43 NOT DETECTED NOT DETECTED Final   Metapneumovirus NOT DETECTED NOT DETECTED Final   Rhinovirus / Enterovirus NOT DETECTED NOT DETECTED Final   Influenza A NOT DETECTED NOT DETECTED Final   Influenza B NOT DETECTED NOT DETECTED Final   Parainfluenza Virus 1 NOT DETECTED NOT DETECTED Final    Parainfluenza Virus 2 NOT DETECTED NOT DETECTED Final   Parainfluenza Virus 3 NOT DETECTED NOT DETECTED Final   Parainfluenza Virus 4 NOT DETECTED NOT DETECTED Final   Respiratory Syncytial Virus NOT DETECTED NOT DETECTED Final   Bordetella pertussis NOT DETECTED NOT DETECTED Final   Chlamydophila pneumoniae NOT DETECTED NOT DETECTED Final   Mycoplasma pneumoniae NOT DETECTED NOT DETECTED Final    Comment: Performed at [facility], [address], [place], [place] [address]  MRSA PCR Screening     Status: None   Collection Time: 11/23/18  1:30 AM  Result Value Ref Range Status   MRSA by PCR NEGATIVE NEGATIVE Final    Comment:        The GeneXpert MRSA Assay (FDA approved for NASAL specimens only), is one component of a comprehensive MRSA colonization surveillance program. It is not intended to diagnose MRSA infection nor to guide or monitor treatment for MRSA infections. Performed at [facility], [address] [place], [place] [address]      Studies: Dg Chest 2 View  Result Date: 11/22/2018 CLINICAL DATA:  Flu-like symptoms. Weakness, nausea, and diarrhea since Monday. Previous smoker. EXAM: CHEST - 2 VIEW COMPARISON:  03/02/2016 FINDINGS: Shallow inspiration. Heart size and pulmonary vascularity are normal. Infiltration in the left upper lung and perihilar region likely representing pneumonia. Follow-up after resolution of acute process is recommended to exclude underlying lesion. Right lung is clear. No blunting of costophrenic angles. No pneumothorax. IMPRESSION: Left upper lung pneumonia. Followup PA and lateral chest X-ray is recommended in 3-4 weeks following appropriate clinical therapy to ensure resolution and exclude underlying malignancy. Electronically Signed   By: Burman Nieves M.D.   On: 11/22/2018 21:38   Ct Chest Wo Contrast  Result Date: 11/23/2018 CLINICAL DATA:  Pneumonia and dyspnea. EXAM: CT CHEST WITHOUT CONTRAST TECHNIQUE:  Multidetector CT imaging of the chest was performed following the standard protocol without IV contrast. COMPARISON:  08/30/2015 FINDINGS: Cardiovascular: Normal heart size. No pericardial effusion. Coronary artery and aortic calcifications. Normal caliber thoracic aorta. Mediastinum/Nodes: Esophagus is decompressed. No significant lymphadenopathy in the chest. Lungs/Pleura: Left upper lung consolidation with air bronchograms consistent with pneumonia. Right lung  is clear and expanded. No pleural effusions. No pneumothorax. Airways are patent. Upper Abdomen: No acute abnormalities demonstrated. Musculoskeletal: Degenerative changes in the spine. No destructive bone lesions. IMPRESSION: Left upper lung consolidation with air bronchograms consistent with pneumonia. Followup PA and lateral chest X-ray is recommended in 3-4 weeks following appropriate clinical therapy to ensure resolution and exclude underlying malignancy. Electronically Signed   By: Burman Nieves M.D.   On: 11/23/2018 00:53    Scheduled Meds:  enoxaparin (LOVENOX) injection  40 mg Subcutaneous QHS   gabapentin  1,600 mg Oral QHS   guaiFENesin  600 mg Oral BID   ipratropium-albuterol  3 mL Nebulization Q6H   mometasone-formoterol  2 puff Inhalation BID   pneumococcal 23 valent vaccine  0.5 mL Intramuscular Tomorrow-1000    Continuous Infusions:  azithromycin 250 mL/hr at 11/24/18 0214   cefTRIAXone (ROCEPHIN)  IV Stopped (11/23/18 2356)     Joycelyn Das, MD  [facility] 11/24/2018

## 2018-11-25 ENCOUNTER — Encounter (HOSPITAL_COMMUNITY): Payer: Self-pay | Admitting: Internal Medicine

## 2018-11-25 LAB — LEGIONELLA PNEUMOPHILA SEROGP 1 UR AG: L. PNEUMOPHILA SEROGP 1 UR AG: NEGATIVE

## 2018-11-25 MED ORDER — IPRATROPIUM-ALBUTEROL 0.5-2.5 (3) MG/3ML IN SOLN
3.0000 mL | Freq: Three times a day (TID) | RESPIRATORY_TRACT | Status: DC
  Administered 2018-11-25: 3 mL via RESPIRATORY_TRACT
  Filled 2018-11-25: qty 3

## 2018-11-25 MED ORDER — POTASSIUM CHLORIDE ER 20 MEQ PO TBCR: 20.0000 meq | EXTENDED_RELEASE_TABLET | Freq: Every day | ORAL | 0 refills | Status: DC

## 2018-11-25 MED ORDER — BUDESONIDE-FORMOTEROL FUMARATE 160-4.5 MCG/ACT IN AERO: 2.0000 | INHALATION_SPRAY | Freq: Two times a day (BID) | RESPIRATORY_TRACT | 0 refills | Status: DC

## 2018-11-25 MED ORDER — LEVOFLOXACIN 750 MG PO TABS: 750.0000 mg | ORAL_TABLET | Freq: Every day | ORAL | 0 refills | Status: AC

## 2018-11-25 NOTE — Progress Notes (Signed)
Discharge instructions explained to patient. Prescriptions needed to be picked up at pharmacy. Daughter coming to pick up patient and bring her home. Pneumonia vaccine information sheet given to patient and vaccine given to her. Patient discharged via wheelchair.

## 2018-11-25 NOTE — Discharge Summary (Signed)
Physician Discharge Summary  [name] YOV:[id number] DOB: [birth year] DOA: 11/22/2018  PCP: Grayce Sessions, NP  Admit date: 11/22/2018 Discharge date: 11/25/2018  Admitted From: Home  Discharge disposition: home   Recommendations for Outpatient Follow-Up:   Follow up with your primary care provider in one week.   Discharge Diagnosis:   Active Problems:   COPD (chronic obstructive pulmonary disease) (HCC)   Nicotine dependence   Acute respiratory failure with hypoxia (HCC)   AKI (acute kidney injury) (HCC)   CAP (community acquired pneumonia)   Sepsis (HCC)   Diarrhea   Dehydration   Hypokalemia    Discharge Condition: Improved.  Diet recommendation: Low sodium, heart healthy.    Wound care: None.  Code status: Full.   History of Present Illness:   [name] a 63 y.o.femalewith medical history significant ofCOPD, hypertension, restless leg syndrome, Graves disease,nocturnal hypoxemia with oxygen 2 L/min at home presented to the hospital with flulike symptoms including weakness, nausea and diarrhea for 5 days with generalized myalgia and subjective fever and shortness of breath at home.  Patient also complained of wheezing cough despite using her home nebulizers.  Patient did have a daughter who had similar symptoms last week.   Hospital Course:  Patient was admitted to hospital and following conditions were addressed during hospitalization,  Left upper lobe community-acquired pneumonia secondary to Streptococcus pneumoniae.  Respiratory viral panel was negative but urinary strep antigen was positive.  Flu swab was negative.    Patient received Rocephin and Zithromax during hospitalization.  procalcitonin was 0.7.   Was initially in the stepdown unit and was subsequently transferred.  Hypotension likely secondary to volume depletion.  Improved with IV fluid hydration.  IV fluids were subsequently discontinued  Hypophosphatemia.    Was  replenished during hospitalization.  Hypokalemia.    Was replenished.  Will be given potassium supplements on discharge and was advised to follow-up with her primary care physician for BMP.  Acute on chronic respiratory failurewith hypoxia- due to large LUL PNA.    Will be given Levaquin on discharge to complete course.  Blood cultures were negative.  Acute kidney injury likely secondary to volume depletion.  Patient received IV fluid hydration with improvement in renal function.    COPD exacerbation likely secondary to pneumonia.    Patient was continued on  oxygen nebulizers and antibiotics.  improved on discharge  Nicotine dependence- now in remission  Sepsis secondary to pneumonia.  Resolved.  Diarrhea-resolved spontaneously.  Disposition.  At this time, patient is stable for disposition home.  She will have to complete the course of antibiotic and follow-up with her primary care physician as outpatient.   Medical Consultants:    None.   Subjective:   Today, patient feels better, no cough, fever or dyspnea. On oxygen at home.  Discharge Exam:   Vitals:   11/25/18 0802 11/25/18 0805  BP: (!) 148/97 136/85  Pulse: (!) 59 60  Resp: 18   Temp: 98.7 F (37.1 C)   SpO2: 100% 96%   Vitals:   11/24/18 2139 11/25/18 0620 11/25/18 0802 11/25/18 0805  BP:  120/68 (!) 148/97 136/85  Pulse:  65 (!) 59 60  Resp:  18 18   Temp:  98.4 F (36.9 C) 98.7 F (37.1 C)   TempSrc:  Oral Oral   SpO2: 98% 99% 100% 96%  Weight:      Height:        General exam: Appears calm and comfortable ,Not in distress.  On nasal canula 2 L/min HEENT:PERRL,Oral mucosa moist Respiratory system: Bilateral equal air entry, normal vesicular breath sounds, no wheezes or crackles  Cardiovascular system: S1 & S2 heard, RRR.  Gastrointestinal system: Abdomen is nondistended, soft and nontender. No organomegaly or masses felt. Normal bowel sounds heard. Central nervous system: Alert and  oriented. No focal neurological deficits. Extremities: No edema, no clubbing ,no cyanosis, distal peripheral pulses palpable. Skin: No rashes, lesions or ulcers,no icterus ,no pallor MSK: Normal muscle bulk,tone ,power    Procedures:   None  The results of significant diagnostics from this hospitalization (including imaging, microbiology, ancillary and laboratory) are listed below for reference.     Diagnostic Studies:   Dg Chest 2 View  Result Date: 11/22/2018 CLINICAL DATA:  Flu-like symptoms. Weakness, nausea, and diarrhea since Monday. Previous smoker. EXAM: CHEST - 2 VIEW COMPARISON:  03/02/2016 FINDINGS: Shallow inspiration. Heart size and pulmonary vascularity are normal. Infiltration in the left upper lung and perihilar region likely representing pneumonia. Follow-up after resolution of acute process is recommended to exclude underlying lesion. Right lung is clear. No blunting of costophrenic angles. No pneumothorax. IMPRESSION: Left upper lung pneumonia. Followup PA and lateral chest X-ray is recommended in 3-4 weeks following appropriate clinical therapy to ensure resolution and exclude underlying malignancy. Electronically Signed   By: Burman Nieves M.D.   On: 11/22/2018 21:38   Ct Chest Wo Contrast  Result Date: 11/23/2018 CLINICAL DATA:  Pneumonia and dyspnea. EXAM: CT CHEST WITHOUT CONTRAST TECHNIQUE: Multidetector CT imaging of the chest was performed following the standard protocol without IV contrast. COMPARISON:  08/30/2015 FINDINGS: Cardiovascular: Normal heart size. No pericardial effusion. Coronary artery and aortic calcifications. Normal caliber thoracic aorta. Mediastinum/Nodes: Esophagus is decompressed. No significant lymphadenopathy in the chest. Lungs/Pleura: Left upper lung consolidation with air bronchograms consistent with pneumonia. Right lung is clear and expanded. No pleural effusions. No pneumothorax. Airways are patent. Upper Abdomen: No acute abnormalities  demonstrated. Musculoskeletal: Degenerative changes in the spine. No destructive bone lesions. IMPRESSION: Left upper lung consolidation with air bronchograms consistent with pneumonia. Followup PA and lateral chest X-ray is recommended in 3-4 weeks following appropriate clinical therapy to ensure resolution and exclude underlying malignancy. Electronically Signed   By: Burman Nieves M.D.   On: 11/23/2018 00:53     Labs:   Basic Metabolic Panel: Recent Labs  Lab 11/22/18 2112 11/23/18 0144 11/23/18 0325 11/24/18 0806  NA 134*  --  138 141  K 2.7*  --  2.9* 3.1*  CL 96*  --  103 108  CO2 26  --  24 24  GLUCOSE 146*  --  136* 99  BUN 21  --  17 10  CREATININE 1.71*  --  1.27* 0.92  CALCIUM 8.4*  --  7.6* 7.8*  MG  --  2.0 2.1 2.3  PHOS  --  2.2* 1.9*  --    GFR Estimated Creatinine Clearance: 71.1 mL/min (by C-G formula based on SCr of 0.92 mg/dL). Liver Function Tests: Recent Labs  Lab 11/22/18 2112 11/23/18 0325  AST 15 15  ALT 10 9  ALKPHOS 98 102  BILITOT 2.5* 1.7*  PROT 6.7 6.3*  ALBUMIN 3.1* 2.9*   No results for input(s): LIPASE, AMYLASE in the last 168 hours. No results for input(s): AMMONIA in the last 168 hours. Coagulation profile No results for input(s): INR, PROTIME in the last 168 hours.  CBC: Recent Labs  Lab 11/22/18 2112 11/23/18 0325 11/24/18 0806  WBC 21.0* 16.1* 13.8*  NEUTROABS 17.4*  --   --   HGB 12.3 12.3 10.3*  HCT 37.7 40.0 32.6*  MCV 89.3 93.0 91.3  PLT 236 233 240   Cardiac Enzymes: No results for input(s): CKTOTAL, CKMB, CKMBINDEX, TROPONINI in the last 168 hours. BNP: Invalid input(s): POCBNP CBG: No results for input(s): GLUCAP in the last 168 hours. D-Dimer No results for input(s): DDIMER in the last 72 hours. Hgb A1c No results for input(s): HGBA1C in the last 72 hours. Lipid Profile No results for input(s): CHOL, HDL, LDLCALC, TRIG, CHOLHDL, LDLDIRECT in the last 72 hours. Thyroid function studies Recent Labs     11/23/18 0325  TSH 2.424   Anemia work up No results for input(s): VITAMINB12, FOLATE, FERRITIN, TIBC, IRON, RETICCTPCT in the last 72 hours. Microbiology Recent Results (from the past 240 hour(s))  Blood Culture (routine x 2)     Status: None (Preliminary result)   Collection Time: 11/22/18 10:17 PM  Result Value Ref Range Status   Specimen Description   Final    BLOOD LEFT HAND Performed at [facility], [address] [place], [place] [address]    Special Requests   Final    BOTTLES DRAWN AEROBIC AND ANAEROBIC Blood Culture adequate volume Performed at [facility], [address] [place], [place] [address]    Culture   Final    NO GROWTH 1 DAY Performed at [facility], [address], [place], [place] [address]    Report Status PENDING  Incomplete  Blood Culture (routine x 2)     Status: None (Preliminary result)   Collection Time: 11/22/18 10:22 PM  Result Value Ref Range Status   Specimen Description   Final    BLOOD RIGHT WRIST Performed at [facility], [address], [place], [place] [address]    Special Requests   Final    BOTTLES DRAWN AEROBIC AND ANAEROBIC Blood Culture adequate volume Performed at [facility], [address], [place], [place] [address]    Culture   Final    NO GROWTH 1 DAY Performed at [facility], [address] [place], [place] [address]    Report Status PENDING  Incomplete  Respiratory Panel by PCR     Status: None   Collection Time: 11/23/18 12:45 AM  Result Value Ref Range Status   Adenovirus NOT DETECTED NOT DETECTED Final   Coronavirus 229E NOT DETECTED NOT DETECTED Final    Comment: (NOTE) The Coronavirus on the Respiratory Panel, DOES NOT test for the novel  Coronavirus (2019 nCoV)    Coronavirus HKU1 NOT DETECTED NOT DETECTED Final   Coronavirus NL63 NOT DETECTED NOT DETECTED Final   Coronavirus OC43 NOT DETECTED NOT DETECTED Final    Metapneumovirus NOT DETECTED NOT DETECTED Final   Rhinovirus / Enterovirus NOT DETECTED NOT DETECTED Final   Influenza A NOT DETECTED NOT DETECTED Final   Influenza B NOT DETECTED NOT DETECTED Final   Parainfluenza Virus 1 NOT DETECTED NOT DETECTED Final   Parainfluenza Virus 2 NOT DETECTED NOT DETECTED Final   Parainfluenza Virus 3 NOT DETECTED NOT DETECTED Final   Parainfluenza Virus 4 NOT DETECTED NOT DETECTED Final   Respiratory Syncytial Virus NOT DETECTED NOT DETECTED Final   Bordetella pertussis NOT DETECTED NOT DETECTED Final   Chlamydophila pneumoniae NOT DETECTED NOT DETECTED Final   Mycoplasma pneumoniae NOT DETECTED NOT DETECTED Final    Comment: Performed at [facility], [address] [place], [place] [address]  MRSA PCR Screening     Status: None   Collection Time: 11/23/18  1:30 AM  Result Value Ref Range Status   MRSA by PCR NEGATIVE NEGATIVE Final    Comment:        The GeneXpert MRSA Assay (FDA approved for NASAL specimens only), is one component of a comprehensive MRSA colonization surveillance program. It is not intended to diagnose MRSA infection nor to guide or monitor treatment for MRSA infections. Performed at [facility], [address] [place], [place] [address]      Discharge Instructions:   Discharge Instructions    Diet - low sodium heart healthy   Complete by:  As directed    Discharge instructions   Complete by:  As directed    Follow up with your primary care physician in one week. Complete the course of antibiotics. Use oxygen at 2 liters per minute. Discuss about Chest xray in your next visit.   Increase activity slowly   Complete by:  As directed      Allergies as of 11/25/2018   No Known Allergies     Medication List    TAKE these medications   albuterol (5 MG/ML) 0.5% nebulizer solution Commonly known as:  PROVENTIL Take 0.5 mLs (2.5 mg total) by nebulization every 6 (six) hours as needed for  wheezing or shortness of breath.   albuterol 108 (90 Base) MCG/ACT inhaler Commonly known as:  PROVENTIL HFA;VENTOLIN HFA Inhale 2 puffs into the lungs every 6 (six) hours as needed for wheezing or shortness of breath.   aspirin 81 MG chewable tablet Chew 81 mg by mouth daily.   budesonide-formoterol 160-4.5 MCG/ACT inhaler Commonly known as:  SYMBICORT Inhale 2 puffs into the lungs 2 (two) times daily. What changed:  when to take this   escitalopram 10 MG tablet Commonly known as:  LEXAPRO Take 10 mg by mouth daily.   gabapentin 400 MG capsule Commonly known as:  NEURONTIN Take 1,600 mg by mouth at bedtime.   hydrochlorothiazide 25 MG tablet Commonly known as:  HYDRODIURIL Take 25 mg by mouth daily.   levofloxacin 750 MG tablet Commonly known as:  Levaquin Take 1 tablet (750 mg total) by mouth daily for 5 days.   multivitamin with minerals Tabs tablet Take 1 tablet by mouth daily.   OXYGEN Inhale 2 L into the lungs at bedtime.   Potassium Chloride ER 20 MEQ Tbcr Take 20 mEq by mouth daily for 5 days.   Probiotic Daily Caps Take 1 capsule by mouth daily.   traMADol 50 MG tablet Commonly known as:  ULTRAM Take 50 mg by mouth every 6 (six) hours as needed for moderate pain.   Vitamin D 50 MCG (2000 UT) tablet Take 2,000 Units by mouth daily.         Time coordinating discharge: 39 minutes  Signed:  Carlen Rebuck  [facility] 11/25/2018, 8:05 AM

## 2018-11-28 LAB — CULTURE, BLOOD (ROUTINE X 2)
Culture: NO GROWTH
Culture: NO GROWTH
Special Requests: ADEQUATE
Special Requests: ADEQUATE

## 2019-02-08 ENCOUNTER — Other Ambulatory Visit (INDEPENDENT_AMBULATORY_CARE_PROVIDER_SITE_OTHER): Payer: Self-pay | Admitting: Primary Care

## 2019-03-12 DIAGNOSIS — J449 Chronic obstructive pulmonary disease, unspecified: Secondary | ICD-10-CM | POA: Diagnosis not present

## 2019-04-12 DIAGNOSIS — J449 Chronic obstructive pulmonary disease, unspecified: Secondary | ICD-10-CM | POA: Diagnosis not present

## 2019-05-13 DIAGNOSIS — J449 Chronic obstructive pulmonary disease, unspecified: Secondary | ICD-10-CM | POA: Diagnosis not present

## 2019-06-12 DIAGNOSIS — J449 Chronic obstructive pulmonary disease, unspecified: Secondary | ICD-10-CM | POA: Diagnosis not present

## 2019-07-13 DIAGNOSIS — J449 Chronic obstructive pulmonary disease, unspecified: Secondary | ICD-10-CM | POA: Diagnosis not present

## 2020-08-09 ENCOUNTER — Telehealth (INDEPENDENT_AMBULATORY_CARE_PROVIDER_SITE_OTHER): Payer: Self-pay | Admitting: Primary Care

## 2021-01-04 ENCOUNTER — Other Ambulatory Visit: Payer: Self-pay

## 2021-01-04 ENCOUNTER — Ambulatory Visit: Payer: Medicare (Managed Care) | Admitting: Student

## 2021-01-04 DIAGNOSIS — I10 Essential (primary) hypertension: Secondary | ICD-10-CM

## 2021-01-04 DIAGNOSIS — Z01818 Encounter for other preprocedural examination: Secondary | ICD-10-CM

## 2021-01-04 NOTE — Progress Notes (Signed)
Patient presents for preoperative EKG prior to eye surgery tomorrow at [facility].  EKG details below, no significant change compared to prior EKG in 11/22/2018 while in [facility] emergency department for evaluation management of sepsis secondary to pneumonia.  EKG 01/04/2021: Sinus rhythm at a rate of 68 bpm.  Left atrial enlargement.  Normal axis.  IVCD.  Nonspecific T wave abnormality. Low voltage complexes, consider pulmonary disease pattern. Compared to EKG 11/22/2018, no significant change.   Pre-op evaluation - Plan: EKG 12-Lead  HYPERTENSION, BENIGN ESSENTIAL

## 2021-02-11 NOTE — Progress Notes (Signed)
Requested cardiac clearance note from dr Karleen Hampshire office, spoke with Morrie Sheldon.

## 2021-02-14 ENCOUNTER — Encounter (HOSPITAL_BASED_OUTPATIENT_CLINIC_OR_DEPARTMENT_OTHER): Payer: Self-pay | Admitting: Ophthalmology

## 2021-02-14 NOTE — H&P (Signed)
[name] is an 65 y.o. female.   Chief Complaint: My eyes do not work together and causes me Headaches. HPI: 65 y/o BF c chronic strabismus, exotropia c  Left hypertropia presents for elective repair of strabismus under general anesthesia .  Past Medical History:  Diagnosis Date  . Asthma   . COPD (chronic obstructive pulmonary disease) (HCC)   . History of hyperthyroidism    s/p  RAI 12/ 2013  . Hypertension   . Nocturnal hypoxemia    uses 2L oxygen at night  . Nocturnal leg cramps 06/22/2015  . RLS (restless legs syndrome) 06/22/2015    Past Surgical History:  Procedure Laterality Date  . ABDOMINAL HYSTERECTOMY    . APPENDECTOMY      Family History  Problem Relation Age of Onset  . Kidney failure Mother   . Heart failure Mother   . Heart attack Father   . Heart attack Brother    Social History:  reports that she quit smoking about 4 years ago. Her smoking use included cigarettes. She smoked 1.00 pack per day. She has never used smokeless tobacco. She reports that she does not drink alcohol and does not use drugs.  Allergies: No Known Allergies  No medications prior to admission.    No results found for this or any previous visit (from the past 48 hour(s)). No results found.  Review of Systems  Constitutional: Negative.   HENT: Negative.   Eyes:       Exotropia  c hypertropia  Respiratory:       Hx of  Sleep apnea  Cardiovascular: Negative.   Gastrointestinal: Negative.   Endocrine: Negative.   Genitourinary: Negative.   Musculoskeletal: Negative.   Skin: Negative.     There were no vitals taken for this visit. Physical Exam HENT:     Head: Normocephalic and atraumatic.     Nose: Nose normal.  Eyes:      Comments: Exotropia  Left hypertropia.  Neurological:     Mental Status: She is alert.      Assessment/Plan Exotropia  c Left hypertropia :    Schedule for EOMS : RLR  Recession  ; RMR resection :  LIO resection :  c RMR on AS. Under general   Anesthesia.  Aura Camps, MD 02/14/2021, 5:12 PM

## 2021-02-15 ENCOUNTER — Other Ambulatory Visit: Payer: Self-pay

## 2021-02-15 ENCOUNTER — Encounter (HOSPITAL_BASED_OUTPATIENT_CLINIC_OR_DEPARTMENT_OTHER): Payer: Self-pay | Admitting: Ophthalmology

## 2021-02-15 NOTE — Anesthesia Preprocedure Evaluation (Addendum)
Anesthesia Evaluation  Patient identified by MRN, date of birth, ID band Patient awake    Reviewed: Allergy & Precautions, NPO status , Patient's Chart, lab work & pertinent test results  Airway Mallampati: II  TM Distance: >3 FB     Dental   Pulmonary asthma , pneumonia, COPD,  oxygen dependent, Current Smoker and Patient abstained from smoking.,    breath sounds clear to auscultation       Cardiovascular hypertension, + DOE   Rhythm:Regular Rate:Normal     Neuro/Psych Seizures -,  PSYCHIATRIC DISORDERS Depression  Neuromuscular disease    GI/Hepatic negative GI ROS, Neg liver ROS,   Endo/Other    Renal/GU Renal disease     Musculoskeletal   Abdominal   Peds  Hematology   Anesthesia Other Findings   Reproductive/Obstetrics                           Anesthesia Physical Anesthesia Plan  ASA: III  Anesthesia Plan: General   Post-op Pain Management:    Induction: Intravenous  PONV Risk Score and Plan: 3 and Ondansetron, Dexamethasone and Midazolam  Airway Management Planned: LMA  Additional Equipment:   Intra-op Plan:   Post-operative Plan: Extubation in OR  Informed Consent: I have reviewed the patients History and Physical, chart, labs and discussed the procedure including the risks, benefits and alternatives for the proposed anesthesia with the patient or authorized representative who has indicated his/her understanding and acceptance.     Dental advisory given  Plan Discussed with: Anesthesiologist and CRNA  Anesthesia Plan Comments:        Anesthesia Quick Evaluation

## 2021-02-15 NOTE — Progress Notes (Addendum)
Spoke w/ via phone for pre-op interview--- pT Lab needs dos---- [employer]              Lab results------ current ekg dated 01-04-2021 in epic/ chart ;  Current cxr one dated 01-04-2021 w/ chart and one dated 01-13-2021 in care everywhere  COVID test -----patient states asymptomatic no test needed Arrive at -------  0630 on 02-16-2021 NPO after MN NO Solid Food.  Clear liquids (water) from MN until--- 0530 Med rec completed Medications to take morning of surgery ----- symbicort inhaler Diabetic medication ----- n/a Patient instructed no nail polish to be worn day of surgery Patient instructed to bring photo id and insurance card day of surgery Patient aware to have Driver (ride ) / caregiver    for 24 hours after surgery -- daughter, Theresia Bough  Patient Special Instructions ----- advised pt to do nebulizer night before surgery and bring rescue dos Pre-Op special Istructions ----- called and spoke w/ dr Karleen Hampshire yesterday about getting pt's last pcp office note due to recent pneumonia , stated will do.  Patient verbalized understanding of instructions that were given at this phone interview. Patient denies shortness of breath, chest pain, fever, cough at this phone interview.   Anesthesia Review:  HTN:  COPD w/ Asthma, recent dx pneumonia 04/ 2022 treated and follow-up cxr 01-13-2021 in care everywhere.  Hx sleep study in epic 08-14-2017 nocturnal hypoxia no obstruction started on O2 2L only at bedtime.  Pt stated no residual from pneumonia,  Checks O2 sats during the day with normal sats on RA, nighttime down to 70-88% with continuous monitor. Pt get DOE w/ stairs/ hills , no issue with other activity.Marland Kitchen  PCP:  Gwinda Passe PA (pt said was seen yesterday) Pulmnologist:  Dr Delton Coombes (lov in epic 10-04-2017) Chest x-ray : 01-13-2021 care everywhere EKG : 01-04-2021 epic Echo : no Stress test: no Sleep Study/ CPAP :  NO/  Oxygen at night Blood Thinner/ Instructions Maurice Small Dose:  NO ASA /  Instructions/ Last Dose : ASA 81mg /  Pt stated no instructions to stop

## 2021-02-16 ENCOUNTER — Ambulatory Visit (HOSPITAL_BASED_OUTPATIENT_CLINIC_OR_DEPARTMENT_OTHER)
Admission: RE | Admit: 2021-02-16 | Discharge: 2021-02-16 | Disposition: A | Discharge status: Home / Self Care | Payer: Medicare (Managed Care) | Attending: Ophthalmology | Admitting: Ophthalmology

## 2021-02-16 ENCOUNTER — Ambulatory Visit (HOSPITAL_BASED_OUTPATIENT_CLINIC_OR_DEPARTMENT_OTHER): Payer: Medicare (Managed Care) | Admitting: Anesthesiology

## 2021-02-16 ENCOUNTER — Encounter (HOSPITAL_BASED_OUTPATIENT_CLINIC_OR_DEPARTMENT_OTHER): Payer: Self-pay | Admitting: Ophthalmology

## 2021-02-16 ENCOUNTER — Encounter (HOSPITAL_BASED_OUTPATIENT_CLINIC_OR_DEPARTMENT_OTHER)
Admission: RE | Disposition: A | Discharge status: Home / Self Care | Payer: Self-pay | Source: Home / Self Care | Attending: Ophthalmology

## 2021-02-16 DIAGNOSIS — H532 Diplopia: Secondary | ICD-10-CM | POA: Diagnosis not present

## 2021-02-16 DIAGNOSIS — Z8249 Family history of ischemic heart disease and other diseases of the circulatory system: Secondary | ICD-10-CM | POA: Insufficient documentation

## 2021-02-16 DIAGNOSIS — Z87891 Personal history of nicotine dependence: Secondary | ICD-10-CM | POA: Diagnosis not present

## 2021-02-16 DIAGNOSIS — H5010 Unspecified exotropia: Secondary | ICD-10-CM | POA: Insufficient documentation

## 2021-02-16 DIAGNOSIS — H5021 Vertical strabismus, right eye: Secondary | ICD-10-CM | POA: Diagnosis not present

## 2021-02-16 DIAGNOSIS — Z841 Family history of disorders of kidney and ureter: Secondary | ICD-10-CM | POA: Diagnosis not present

## 2021-02-16 DIAGNOSIS — Z9889 Other specified postprocedural states: Secondary | ICD-10-CM | POA: Insufficient documentation

## 2021-02-16 HISTORY — PX: MEDIAN RECTUS REPAIR: SHX5301

## 2021-02-16 HISTORY — PX: ADJUSTABLE SUTURE MANIPULATION: SHX5290

## 2021-02-16 HISTORY — DX: Personal history of other endocrine, nutritional and metabolic disease: Z86.39

## 2021-02-16 HISTORY — DX: Other forms of dyspnea: R06.09

## 2021-02-16 HISTORY — DX: Dyspnea, unspecified: R06.00

## 2021-02-16 HISTORY — PX: MUSCLE RECESSION AND RESECTION: SHX5209

## 2021-02-16 HISTORY — PX: STRABISMUS SURGERY: SHX218

## 2021-02-16 HISTORY — DX: Chronic obstructive pulmonary disease, unspecified: J44.9

## 2021-02-16 HISTORY — DX: Other specified chronic obstructive pulmonary disease: J44.89

## 2021-02-16 HISTORY — DX: Idiopathic sleep related nonobstructive alveolar hypoventilation: G47.34

## 2021-02-16 HISTORY — DX: Presence of dental prosthetic device (complete) (partial): Z97.2

## 2021-02-16 LAB — POCT I-STAT, CHEM 8
BUN: 19 mg/dL (ref 8–23)
Calcium, Ion: 1.23 mmol/L (ref 1.15–1.40)
Chloride: 100 mmol/L (ref 98–111)
Creatinine, Ser: 0.9 mg/dL (ref 0.44–1.00)
Glucose, Bld: 95 mg/dL (ref 70–99)
HCT: 45 % (ref 36.0–46.0)
Hemoglobin: 15.3 g/dL — ABNORMAL HIGH (ref 12.0–15.0)
Potassium: 3.3 mmol/L — ABNORMAL LOW (ref 3.5–5.1)
Sodium: 140 mmol/L (ref 135–145)
TCO2: 28 mmol/L (ref 22–32)

## 2021-02-16 SURGERY — REPAIR, MUSCLE, MEDIAL RECTUS
Anesthesia: General | Laterality: Right

## 2021-02-16 SURGERY — ADJUSTABLE SUTURE MANIPULATION
Anesthesia: Topical | Laterality: Right

## 2021-02-16 MED ORDER — TOBRAMYCIN 0.3 % OP OINT
TOPICAL_OINTMENT | OPHTHALMIC | Status: DC | PRN
  Administered 2021-02-16: 1 via OPHTHALMIC

## 2021-02-16 MED ORDER — MIDAZOLAM HCL 2 MG/2ML IJ SOLN
INTRAMUSCULAR | Status: AC
  Filled 2021-02-16: qty 2

## 2021-02-16 MED ORDER — TETRACAINE HCL 0.5 % OP SOLN
OPHTHALMIC | Status: DC | PRN
  Administered 2021-02-16: 3 [drp] via OPHTHALMIC

## 2021-02-16 MED ORDER — BSS IO SOLN
INTRAOCULAR | Status: AC
  Filled 2021-02-16: qty 15

## 2021-02-16 MED ORDER — ALBUTEROL SULFATE HFA 108 (90 BASE) MCG/ACT IN AERS
INHALATION_SPRAY | RESPIRATORY_TRACT | Status: DC | PRN
  Administered 2021-02-16: 6 via RESPIRATORY_TRACT

## 2021-02-16 MED ORDER — EPHEDRINE SULFATE 50 MG/ML IJ SOLN
INTRAMUSCULAR | Status: DC | PRN
  Administered 2021-02-16: 5 mg via INTRAVENOUS
  Administered 2021-02-16: 10 mg via INTRAVENOUS

## 2021-02-16 MED ORDER — SUCCINYLCHOLINE CHLORIDE 200 MG/10ML IV SOSY
PREFILLED_SYRINGE | INTRAVENOUS | Status: DC | PRN
  Administered 2021-02-16: 140 mg via INTRAVENOUS

## 2021-02-16 MED ORDER — ALBUTEROL SULFATE (2.5 MG/3ML) 0.083% IN NEBU
2.5000 mg | INHALATION_SOLUTION | Freq: Four times a day (QID) | RESPIRATORY_TRACT | Status: DC | PRN
  Administered 2021-02-16: 2.5 mg via RESPIRATORY_TRACT

## 2021-02-16 MED ORDER — LIDOCAINE HCL (CARDIAC) PF 100 MG/5ML IV SOSY
PREFILLED_SYRINGE | INTRAVENOUS | Status: DC | PRN
  Administered 2021-02-16: 80 mg via INTRAVENOUS

## 2021-02-16 MED ORDER — PROPOFOL 10 MG/ML IV BOLUS
INTRAVENOUS | Status: DC | PRN
  Administered 2021-02-16: 40 mg via INTRAVENOUS
  Administered 2021-02-16: 200 mg via INTRAVENOUS
  Administered 2021-02-16 (×2): 40 mg via INTRAVENOUS

## 2021-02-16 MED ORDER — TOBRADEX 0.3-0.1 % OP OINT: 1.0000 "application " | TOPICAL_OINTMENT | Freq: Two times a day (BID) | OPHTHALMIC | 0 refills | Status: DC

## 2021-02-16 MED ORDER — BSS IO SOLN
INTRAOCULAR | Status: DC | PRN
  Administered 2021-02-16 (×2): 15 mL via INTRAOCULAR

## 2021-02-16 MED ORDER — FENTANYL CITRATE (PF) 100 MCG/2ML IJ SOLN
INTRAMUSCULAR | Status: AC
  Filled 2021-02-16: qty 2

## 2021-02-16 MED ORDER — TOBRAMYCIN-DEXAMETHASONE 0.3-0.1 % OP OINT
TOPICAL_OINTMENT | OPHTHALMIC | Status: AC
  Filled 2021-02-16: qty 3.5

## 2021-02-16 MED ORDER — PHENYLEPHRINE HCL 2.5 % OP SOLN
OPHTHALMIC | Status: DC | PRN
  Administered 2021-02-16: 3 [drp] via OPHTHALMIC

## 2021-02-16 MED ORDER — FENTANYL CITRATE (PF) 100 MCG/2ML IJ SOLN
INTRAMUSCULAR | Status: DC | PRN
  Administered 2021-02-16: 50 ug via INTRAVENOUS
  Administered 2021-02-16: 25 ug via INTRAVENOUS

## 2021-02-16 MED ORDER — MIDAZOLAM HCL 2 MG/2ML IJ SOLN
INTRAMUSCULAR | Status: DC | PRN
  Administered 2021-02-16: 1 mg via INTRAVENOUS

## 2021-02-16 MED ORDER — POVIDONE-IODINE 5 % OP SOLN
OPHTHALMIC | Status: DC | PRN
  Administered 2021-02-16: 1 via OPHTHALMIC

## 2021-02-16 MED ORDER — ROCURONIUM BROMIDE 100 MG/10ML IV SOLN
INTRAVENOUS | Status: DC | PRN
  Administered 2021-02-16 (×2): 10 mg via INTRAVENOUS
  Administered 2021-02-16: 30 mg via INTRAVENOUS

## 2021-02-16 MED ORDER — PHENYLEPHRINE HCL (PRESSORS) 10 MG/ML IV SOLN
INTRAVENOUS | Status: DC | PRN
  Administered 2021-02-16 (×2): 40 ug via INTRAVENOUS
  Administered 2021-02-16 (×5): 80 ug via INTRAVENOUS

## 2021-02-16 MED ORDER — SUGAMMADEX SODIUM 200 MG/2ML IV SOLN
INTRAVENOUS | Status: DC | PRN
  Administered 2021-02-16: 50 mg via INTRAVENOUS
  Administered 2021-02-16: 200 mg via INTRAVENOUS

## 2021-02-16 MED ORDER — LACTATED RINGERS IV SOLN: INTRAVENOUS | Status: DC

## 2021-02-16 MED ORDER — TOBRAMYCIN-DEXAMETHASONE 0.3-0.1 % OP OINT
TOPICAL_OINTMENT | OPHTHALMIC | Status: DC | PRN
  Administered 2021-02-16: 1 via OPHTHALMIC

## 2021-02-16 MED ORDER — GLYCOPYRROLATE 0.2 MG/ML IJ SOLN
INTRAMUSCULAR | Status: DC | PRN
  Administered 2021-02-16: .1 mg via INTRAVENOUS

## 2021-02-16 MED ORDER — ALBUTEROL SULFATE HFA 108 (90 BASE) MCG/ACT IN AERS
INHALATION_SPRAY | RESPIRATORY_TRACT | Status: AC
  Filled 2021-02-16: qty 6.7

## 2021-02-16 MED ORDER — ACETAMINOPHEN-CODEINE #2 300-15 MG PO TABS: 1.0000 | ORAL_TABLET | ORAL | 0 refills | Status: DC | PRN

## 2021-02-16 MED ORDER — GLYCOPYRROLATE PF 0.2 MG/ML IJ SOSY
PREFILLED_SYRINGE | INTRAMUSCULAR | Status: AC
  Filled 2021-02-16: qty 2

## 2021-02-16 MED ORDER — POVIDONE-IODINE 5 % OP SOLN
OPHTHALMIC | Status: AC
  Filled 2021-02-16: qty 30

## 2021-02-16 MED ORDER — FENTANYL CITRATE (PF) 100 MCG/2ML IJ SOLN
25.0000 ug | INTRAMUSCULAR | Status: DC | PRN
  Administered 2021-02-16: 25 ug via INTRAVENOUS

## 2021-02-16 MED ORDER — PROPOFOL 10 MG/ML IV BOLUS
INTRAVENOUS | Status: AC
  Filled 2021-02-16: qty 40

## 2021-02-16 MED ORDER — NEOSTIGMINE METHYLSULFATE 3 MG/3ML IV SOSY
PREFILLED_SYRINGE | INTRAVENOUS | Status: AC
  Filled 2021-02-16: qty 6

## 2021-02-16 MED ORDER — PROPOFOL 500 MG/50ML IV EMUL
INTRAVENOUS | Status: AC
  Filled 2021-02-16: qty 100

## 2021-02-16 MED ORDER — TETRACAINE HCL 0.5 % OP SOLN
OPHTHALMIC | Status: AC
  Filled 2021-02-16: qty 4

## 2021-02-16 MED ORDER — DEXAMETHASONE SODIUM PHOSPHATE 4 MG/ML IJ SOLN
INTRAMUSCULAR | Status: DC | PRN
  Administered 2021-02-16: 8 mg via INTRAVENOUS

## 2021-02-16 MED ORDER — SUCCINYLCHOLINE CHLORIDE 200 MG/10ML IV SOSY: PREFILLED_SYRINGE | INTRAVENOUS | Status: DC | PRN

## 2021-02-16 MED ORDER — SUGAMMADEX SODIUM 200 MG/2ML IV SOLN: INTRAVENOUS | Status: DC | PRN

## 2021-02-16 MED ORDER — OXYCODONE HCL 5 MG PO TABS
ORAL_TABLET | ORAL | Status: AC
  Filled 2021-02-16: qty 1

## 2021-02-16 MED ORDER — OXYCODONE HCL 5 MG PO TABS
5.0000 mg | ORAL_TABLET | Freq: Once | ORAL | Status: AC
  Administered 2021-02-16: 5 mg via ORAL

## 2021-02-16 MED ORDER — ALBUTEROL SULFATE (2.5 MG/3ML) 0.083% IN NEBU
INHALATION_SOLUTION | RESPIRATORY_TRACT | Status: AC
  Filled 2021-02-16: qty 3

## 2021-02-16 MED ORDER — PROPOFOL 500 MG/50ML IV EMUL
INTRAVENOUS | Status: DC | PRN
  Administered 2021-02-16: 75 ug/kg/min via INTRAVENOUS

## 2021-02-16 MED ORDER — PHENYLEPHRINE HCL 2.5 % OP SOLN
OPHTHALMIC | Status: AC
  Filled 2021-02-16: qty 15

## 2021-02-16 MED ORDER — LIDOCAINE HCL (PF) 2 % IJ SOLN
INTRAMUSCULAR | Status: AC
  Filled 2021-02-16: qty 10

## 2021-02-16 SURGICAL SUPPLY — 29 items
APL SRG 3 HI ABS STRL LF PLS (MISCELLANEOUS) ×2
APPLICATOR DR MATTHEWS STRL (MISCELLANEOUS) ×4 IMPLANT
BNDG EYE OVAL (GAUZE/BANDAGES/DRESSINGS) IMPLANT
CAUTERY EYE LOW TEMP 1300F FIN (OPHTHALMIC RELATED) ×8 IMPLANT
CLOSURE WOUND 1/2 X4 (GAUZE/BANDAGES/DRESSINGS) ×1
CORD BIPOLAR FORCEPS 12FT (ELECTRODE) IMPLANT
COVER BACK TABLE 60X90IN (DRAPES) ×4 IMPLANT
COVER MAYO STAND STRL (DRAPES) ×4 IMPLANT
COVER WAND RF STERILE (DRAPES) ×4 IMPLANT
DRAPE SHEET LG 3/4 BI-LAMINATE (DRAPES) ×4 IMPLANT
DRAPE SURG 17X23 STRL (DRAPES) ×12 IMPLANT
GLOVE SURG LTX SZ7.5 (GLOVE) ×4 IMPLANT
GOWN STRL REUS W/TWL LRG LVL3 (GOWN DISPOSABLE) ×4 IMPLANT
KIT TURNOVER CYSTO (KITS) ×4 IMPLANT
MANIFOLD NEPTUNE II (INSTRUMENTS) IMPLANT
MARKER PEN SURG W/LABELS BLK (STERILIZATION PRODUCTS) ×4 IMPLANT
NS IRRIG 500ML POUR BTL (IV SOLUTION) ×4 IMPLANT
PACK BASIN DAY SURGERY FS (CUSTOM PROCEDURE TRAY) ×4 IMPLANT
SPEAR EYE SURGICAL ST (MISCELLANEOUS) ×4 IMPLANT
STRIP CLOSURE SKIN 1/2X4 (GAUZE/BANDAGES/DRESSINGS) ×3 IMPLANT
SUT MERSILENE 5-0 (SUTURE) ×4 IMPLANT
SUT VICRYL 6 0 S 29 12 (SUTURE) ×8 IMPLANT
SUT VICRYL 7 0 TG140 8 (SUTURE) IMPLANT
SUT VICRYL 8 0 TG140 8 (SUTURE) IMPLANT
TOWEL OR 17X26 10 PK STRL BLUE (TOWEL DISPOSABLE) ×4 IMPLANT
TRAY DSU PREP LF (CUSTOM PROCEDURE TRAY) ×4 IMPLANT
TUBE CONNECTING 12'X1/4 (SUCTIONS)
TUBE CONNECTING 12X1/4 (SUCTIONS) IMPLANT
WATER STERILE IRR 500ML POUR (IV SOLUTION) ×4 IMPLANT

## 2021-02-16 SURGICAL SUPPLY — 18 items
APL SRG 3 HI ABS STRL LF PLS (MISCELLANEOUS) ×1
APPLICATOR DR MATTHEWS STRL (MISCELLANEOUS) ×3 IMPLANT
CNTNR URN SCR LID CUP LEK RST (MISCELLANEOUS) ×1 IMPLANT
CONT SPEC 4OZ STRL OR WHT (MISCELLANEOUS) ×3
COVER MAYO STAND STRL (DRAPES) ×3 IMPLANT
GAUZE 4X4 16PLY RFD (DISPOSABLE) ×3 IMPLANT
GLOVE LITE  25/BX (GLOVE) ×3 IMPLANT
GLOVE SURG LTX SZ7.5 (GLOVE) ×3 IMPLANT
KIT TURNOVER CYSTO (KITS) ×3 IMPLANT
MANIFOLD NEPTUNE II (INSTRUMENTS) IMPLANT
MARKER SKIN DUAL TIP RULER LAB (MISCELLANEOUS) ×3 IMPLANT
NS IRRIG 500ML POUR BTL (IV SOLUTION) IMPLANT
PACK ICE SM (MISCELLANEOUS) ×3 IMPLANT
PAD ALCOHOL SWAB (MISCELLANEOUS) ×3 IMPLANT
SYR 3ML 23GX1 SAFETY (SYRINGE) ×3 IMPLANT
TAPE SURG TRANSPORE 1 IN (GAUZE/BANDAGES/DRESSINGS) ×1 IMPLANT
TAPE SURGICAL TRANSPORE 1 IN (GAUZE/BANDAGES/DRESSINGS) ×2
TOWEL OR 17X26 10 PK STRL BLUE (TOWEL DISPOSABLE) ×3 IMPLANT

## 2021-02-16 NOTE — Discharge Instructions (Signed)
Call your surgeon if you experience:   1.  Fever over 101.0. 2.  Inability to urinate. 3.  Nausea and/or vomiting. 4.  Extreme swelling or bruising at the surgical site. 5.  Continued bleeding from the incision. 6.  Increased pain, redness or drainage from the incision. 7.  Problems related to your pain medication. 8.  Any problems and/or concernsAdvance to PO as tolerated . Cool compresses Q 5 mins ou as tolerated. D/C IVF post suture adjustment and PO intake adequate. D/C to home post suture adjustment. F/U @ [facility] x 1WK.    Post Anesthesia Home Care Instructions  Activity: Get plenty of rest for the remainder of the day. A responsible individual must stay with you for 24 hours following the procedure.  For the next 24 hours, DO NOT: -Drive a car -Advertising copywriter -Drink alcoholic beverages -Take any medication unless instructed by your physician -Make any legal decisions or sign important papers.  Meals: Start with liquid foods such as gelatin or soup. Progress to regular foods as tolerated. Avoid greasy, spicy, heavy foods. If nausea and/or vomiting occur, drink only clear liquids until the nausea and/or vomiting subsides. Call your physician if vomiting continues.  Special Instructions/Symptoms: Your throat may feel dry or sore from the anesthesia or the breathing tube placed in your throat during surgery. If this causes discomfort, gargle with warm salt water. The discomfort should disappear within 24 hours.

## 2021-02-16 NOTE — Brief Op Note (Signed)
02/16/2021  10:33 AM  PATIENT:  [name]  65 y.o. female  PRE-OPERATIVE DIAGNOSIS:  EXOTROPIA  POST-OPERATIVE DIAGNOSIS:  EXOTROPIA  PROCEDURE:  Procedure(s): RIGHT LATERAL RECTUS RECESSION (Right) RIGHT MEDIAL RECTUS RESECTION (Right) LEFT INFERIOR OBLIQUE RESECTION (Left)  SURGEON:  Surgeon(s) and Role:    Aura Camps, MD - Primary  PHYSICIAN ASSISTANT:   ASSISTANTS: none   ANESTHESIA:   general  EBL:   none   BLOOD ADMINISTERED:none  DRAINS: none   LOCAL MEDICATIONS USED:  NONE  SPECIMEN:  No Specimen  DISPOSITION OF SPECIMEN:  N/A  COUNTS:  YES  TOURNIQUET:  * No tourniquets in log *  DICTATION: .Other Dictation: Dictation Number [id number]  PLAN OF CARE: Discharge to home after PACU  PATIENT DISPOSITION:  PACU - hemodynamically stable.   Delay start of Pharmacological VTE agent (>24hrs) due to surgical blood loss or risk of bleeding: yes

## 2021-02-16 NOTE — Anesthesia Postprocedure Evaluation (Signed)
Anesthesia Post Note  Patient: [name]  Procedure(s) Performed: RIGHT LATERAL RECTUS RECESSION (Right ) RIGHT MEDIAL RECTUS RESECTION (Right ) LEFT INFERIOR OBLIQUE RESECTION (Left )     Patient location during evaluation: PACU Anesthesia Type: General Level of consciousness: awake Pain management: pain level controlled Vital Signs Assessment: post-procedure vital signs reviewed and stable Respiratory status: spontaneous breathing Cardiovascular status: stable Postop Assessment: no apparent nausea or vomiting Anesthetic complications: no   No complications documented.  Last Vitals:  Vitals:   02/16/21 1221 02/16/21 1245  BP:  (!) 96/41  Pulse:  64  Resp: 20 (!) 24  Temp:    SpO2: 92% 92%    Last Pain:  Vitals:   02/16/21 1218  TempSrc:   PainSc: 0-No pain                 Linea Calles

## 2021-02-16 NOTE — Progress Notes (Signed)
Report given to Norberto Sorenson, RN

## 2021-02-16 NOTE — Anesthesia Procedure Notes (Signed)
Procedure Name: Intubation Date/Time: 02/16/2021 8:35 AM Performed by: Georgeanne Nim, CRNA Pre-anesthesia Checklist: Patient identified, Emergency Drugs available, Suction available and Patient being monitored Preoxygenation: Pre-oxygenation with 100% oxygen Induction Type: IV induction Ventilation: Mask ventilation without difficulty Laryngoscope Size: Mac and 4 Grade View: Grade I Tube type: Oral Tube size: 7.0 mm Number of attempts: 1 Airway Equipment and Method: Stylet Placement Confirmation: ETT inserted through vocal cords under direct vision,  positive ETCO2,  CO2 detector and breath sounds checked- equal and bilateral Secured at: 20 cm Tube secured with: Tape Dental Injury: Teeth and Oropharynx as per pre-operative assessment  Comments: Patient wheezing with ventilation bilateral

## 2021-02-16 NOTE — Brief Op Note (Signed)
02/16/2021  11:37 AM  PATIENT:  [name]  65 y.o. female  PRE-OPERATIVE DIAGNOSIS:  exotropia  POST-OPERATIVE DIAGNOSIS:  EXOTROPIA  PROCEDURE:  Procedure(s): RIGHT MEDIAL RECTUS ADJUSTABLE SUTURE MANIPULATION (Right)  SURGEON:  Surgeon(s) and Role:    Aura Camps, MD - Primary  PHYSICIAN ASSISTANT:   ASSISTANTS: none   ANESTHESIA:   topical  EBL:  10 mL   BLOOD ADMINISTERED:none  DRAINS: none   LOCAL MEDICATIONS USED:  NONE  SPECIMEN:  No Specimen  DISPOSITION OF SPECIMEN:  N/A  COUNTS:  YES  TOURNIQUET:  * No tourniquets in log *  DICTATION: .Other Dictation: Dictation Number [id number]  PLAN OF CARE: Discharge to home after PACU  PATIENT DISPOSITION:  PACU - hemodynamically stable.   Delay start of Pharmacological VTE agent (>24hrs) due to surgical blood loss or risk of bleeding: not applicable

## 2021-02-16 NOTE — Op Note (Signed)
[name], HALLISEY MEDICAL RECORD NO: [id number] ACCOUNT NO: [id number] DATE OF BIRTH: [birth year] FACILITY: [facility] LOCATION: [facility] PHYSICIAN: Tyrone Apple. Karleen Hampshire, MD  Operative Report   DATE OF PROCEDURE: 02/16/2021  PREOPERATIVE DIAGNOSIS:  Status post strabismus repair with an adjustable suture of the right medial rectus.  PROCEDURE: Adjustment of the suture at the right medial rectus.  POSTOPERATIVE DIAGNOSIS:  Status post strabismus repair with an adjustable suture of the right medial rectus.  ANESTHESIA:  Topical.  SURGEON:  Aura Camps, MD  INDICATIONS:  The patient is a 65 year old female who is status post immediate repair of strabismus with an adjustable suture of the right medial rectus.  This procedure is indicated to adjust the suture and adjust the patient's eyes to orthophoria.  DESCRIPTION OF PROCEDURE:  The patient was taken back to the operating room, while awake, the entire face was draped under sterile conditions.  Topical anesthetic 2% Xylocaine was applied to the right eye and also to the left eye until the patient was  anesthetic and next, the patient was allowed to fixate on a distance and near target and the alignment of the ocular axis was checked to determine the need for further adjustment of the sutures.  The suture was then adjusted and the patient was adjusted  to orthophoria and single binocular vision and the excess suture was then cut and tied securely.  The conjunctiva was then repositioned and TobraDex ointment was instilled in inferior fornices of both eyes.  There were no apparent complications.   SHW D: 02/16/2021 11:42:29 am T: 02/16/2021 10:25:00 pm  JOB: [id number]/ [id number]

## 2021-02-16 NOTE — Anesthesia Procedure Notes (Signed)
Procedure Name: LMA Insertion Date/Time: 02/16/2021 8:33 AM Performed by: Earmon Phoenix, CRNA Pre-anesthesia Checklist: Patient identified, Emergency Drugs available, Suction available, Patient being monitored and Timeout performed Patient Re-evaluated:Patient Re-evaluated prior to induction Oxygen Delivery Method: Circle system utilized Preoxygenation: Pre-oxygenation with 100% oxygen Induction Type: IV induction Ventilation: Mask ventilation without difficulty LMA: LMA flexible inserted LMA Size: 4.0 Number of attempts: 2 (unable to ventilate well;leak noted both insertion attempts) Placement Confirmation: positive ETCO2,  CO2 detector and breath sounds checked- equal and bilateral

## 2021-02-16 NOTE — Op Note (Signed)
[name], [name] MEDICAL RECORD NO: [id number] ACCOUNT NO: [id number] DATE OF BIRTH: [birth year] FACILITY: [facility] LOCATION: [facility] PHYSICIAN: Tyrone Apple. Karleen Hampshire, MD  Operative Report   DATE OF PROCEDURE: 02/16/2021  PREOPERATIVE DIAGNOSIS:  Exotropia with left hypertropia.  PROCEDURE:  Left inferior oblique resection, right medial rectus resection on adjustable suture, right lateral rectus recession of 5 mm.    SURGEON:  Aura Camps, MD  ANESTHESIA:  General with endotracheal intubation.  POSTOPERATIVE DIAGNOSES:  Status post a right medial rectus resection of 5 mm, a right lateral rectus recession of 5 mm, a right medial rectus adjustable suture placement and adjustment. The left inferior oblique resection of 5 mm.  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  This procedure is indicated to restore alignment of the visual axis and allow single binocular vision.  The risks and benefits  of the procedure were explained to the patient, prior to the procedure, informed consent was obtained.  DESCRIPTION OF TECHNIQUE:  The patient was taken to the operating room and placed in the supine position.  The entire face was prepped and draped in the usual sterile fashion.  My attention was first directed to the right eye.  A lid speculum was placed.   Forced duction test was performed.  The globe was then held in the inferior nasal quadrant.  The eye was then elevated and adducted. An Incision was made through the inferior nasal fornix, taken down to the posterior sub-Tenons space.  The right medial rectus  tendon was then isolated on a Stevens hook and subsequently on a Green hook.  It was then carefully dissected free from its overlying muscle fascia and intermuscular septae for a distance of approximately 6 mm.  A mark was then placed on the tendon at 5  mm from its insertion and the tendon was then  imbricated on 6-0 Vicryl suture, taking 2 locking bites at the medial and temporal apices at the preplaced mark.  The tendon was then advanced to its native insertion site on the preplaced sutures and  reattached to the globe in an adjustable suture fashion.  Next, my attention was directed to the right lateral rectus.  The globe was held in inferior temporal quadrant.  The eye was elevated and adducted. An incision was made through the inferior  temporal fornix, taken down to the posterior sub-Tenons space,  the right lateral rectus was then isolated on a Stevens hook and subsequently on a Green hook.  A second Green hook was then passed beneath the tendon and this was used to hold the globe in an elevated and adducted position.  The tendon was then carefully dissected free from its overlying muscle fascia and intermuscular septae were transected. The tendon was then imbricated on 6-0 Vicryl suture, taking 2 locking bites at the medial and temporal apices.  The tendon was then detached from the globe and recessed 5 mm from its native insertion.  It was reattached to the globe using the preplaced sutures.  Sutures were tied securely and the conjunctiva was repositioned.  A double pressure  patch was applied to the right eye and my attention was directed to the left eye.  A lid speculum was placed and the globe was then held in inferior temporal quadrant.  The eye was then elevated and adducted.  An incision was made through the inferior  temporal fornix, taken down to the posterior sub-Tenon space and the left  lateral rectus tendon was then isolated on a Stevens hook and subsequently on a Green hook.  This was used to hold the globe in an elevated and adducted position.  Next, the  inferior oblique was identified coursing from its origin in the anterior floor of the orbit to its insertion in the posterior inferior temporal quadrant of the globe.  The tendon was then imbricated at its anterior aspect on 6-0  Vicryl suture, taking 2  locking bites at the medial and temporal apices.  These sutures were then advanced to a point 6 mm posterior or distal and passed through the tendon at this point and a 6 mm tuck was then performed using the preplaced sutures.  Sutures were tied securely.   The conjunctiva was then repositioned. At the conclusion of the procedure, TobraDex ointment was instilled into the inferior fornices of the left eye.  A double pressure patch was applied to the right eye.  The patient was subsequently allowed to awaken  from anesthesia and the right medial rectus suture was adjusted to orthophoria.  There were no apparent complications.   SHW D: 02/16/2021 10:45:30 am T: 02/16/2021 9:08:00 pm  JOB: [id number]/ [id number]

## 2021-02-16 NOTE — Interval H&P Note (Signed)
History and Physical Interval Note:  02/16/2021 8:21 AM  [name]  has presented today for surgery, with the diagnosis of EXOTROPIA.  The various methods of treatment have been discussed with the patient and family. After consideration of risks, benefits and other options for treatment, the patient has consented to  Procedure(s): RIGHT LATERAL RECTUS RECESSION (Right) RIGHT MEDIAL RECTUS RESECTION (Right) LEFT INFERIOR OBLIQUE RESECTION (Left) as a surgical intervention.  The patient's history has been reviewed, patient examined, no change in status, stable for surgery.  I have reviewed the patient's chart and labs.  Questions were answered to the patient's satisfaction.     Aura Camps

## 2021-02-16 NOTE — Transfer of Care (Signed)
Immediate Anesthesia Transfer of Care Note  Patient: [name]  Procedure(s) Performed: RIGHT LATERAL RECTUS RECESSION (Right ) RIGHT MEDIAL RECTUS RESECTION (Right ) LEFT INFERIOR OBLIQUE RESECTION (Left )  Patient Location: PACU  Anesthesia Type:General  Level of Consciousness: awake, alert  and patient cooperative  Airway & Oxygen Therapy: Patient Spontanous Breathing and Patient connected to face mask oxygen  Post-op Assessment: Report given to RN and Post -op Vital signs reviewed and stable  Post vital signs: Reviewed and stable  Last Vitals:  Vitals Value Taken Time  BP 100/66 02/16/21 1035  Temp    Pulse 83 02/16/21 1036  Resp 19 02/16/21 1039  SpO2 95 % 02/16/21 1036  Vitals shown include unvalidated device data.  Last Pain:  Vitals:   02/16/21 0713  TempSrc: Oral  PainSc: 0-No pain      Patients Stated Pain Goal: 1 (02/16/21 0713)  Complications: No complications documented.

## 2021-02-17 ENCOUNTER — Encounter (HOSPITAL_BASED_OUTPATIENT_CLINIC_OR_DEPARTMENT_OTHER): Payer: Self-pay | Admitting: Ophthalmology

## 2021-07-20 ENCOUNTER — Encounter (HOSPITAL_COMMUNITY): Payer: Self-pay | Admitting: Oncology

## 2021-07-20 ENCOUNTER — Ambulatory Visit
Admission: EM | Admit: 2021-07-20 | Discharge: 2021-07-20 | Disposition: A | Discharge status: Other Acute Inpatient Hospital | Payer: Medicare Other

## 2021-07-20 ENCOUNTER — Encounter: Payer: Self-pay | Admitting: Emergency Medicine

## 2021-07-20 ENCOUNTER — Emergency Department (HOSPITAL_COMMUNITY): Payer: Medicare Other

## 2021-07-20 ENCOUNTER — Inpatient Hospital Stay (HOSPITAL_COMMUNITY)
Admission: EM | Admit: 2021-07-20 | Discharge: 2021-07-21 | DRG: 190 | Disposition: A | Discharge status: Home / Self Care | Payer: Medicare Other | Attending: Family Medicine | Admitting: Family Medicine

## 2021-07-20 ENCOUNTER — Other Ambulatory Visit: Payer: Self-pay

## 2021-07-20 DIAGNOSIS — I10 Essential (primary) hypertension: Secondary | ICD-10-CM | POA: Diagnosis present

## 2021-07-20 DIAGNOSIS — R9431 Abnormal electrocardiogram [ECG] [EKG]: Secondary | ICD-10-CM

## 2021-07-20 DIAGNOSIS — F419 Anxiety disorder, unspecified: Secondary | ICD-10-CM | POA: Diagnosis present

## 2021-07-20 DIAGNOSIS — F17210 Nicotine dependence, cigarettes, uncomplicated: Secondary | ICD-10-CM | POA: Diagnosis not present

## 2021-07-20 DIAGNOSIS — Z79899 Other long term (current) drug therapy: Secondary | ICD-10-CM

## 2021-07-20 DIAGNOSIS — Z20822 Contact with and (suspected) exposure to covid-19: Secondary | ICD-10-CM | POA: Diagnosis present

## 2021-07-20 DIAGNOSIS — Z9981 Dependence on supplemental oxygen: Secondary | ICD-10-CM

## 2021-07-20 DIAGNOSIS — Z87440 Personal history of urinary (tract) infections: Secondary | ICD-10-CM

## 2021-07-20 DIAGNOSIS — R7981 Abnormal blood-gas level: Secondary | ICD-10-CM | POA: Diagnosis not present

## 2021-07-20 DIAGNOSIS — Z8249 Family history of ischemic heart disease and other diseases of the circulatory system: Secondary | ICD-10-CM

## 2021-07-20 DIAGNOSIS — E876 Hypokalemia: Secondary | ICD-10-CM | POA: Diagnosis not present

## 2021-07-20 DIAGNOSIS — F32A Depression, unspecified: Secondary | ICD-10-CM | POA: Diagnosis present

## 2021-07-20 DIAGNOSIS — J45901 Unspecified asthma with (acute) exacerbation: Secondary | ICD-10-CM | POA: Diagnosis not present

## 2021-07-20 DIAGNOSIS — Z7982 Long term (current) use of aspirin: Secondary | ICD-10-CM | POA: Diagnosis not present

## 2021-07-20 DIAGNOSIS — J441 Chronic obstructive pulmonary disease with (acute) exacerbation: Secondary | ICD-10-CM | POA: Diagnosis not present

## 2021-07-20 DIAGNOSIS — R350 Frequency of micturition: Secondary | ICD-10-CM | POA: Diagnosis present

## 2021-07-20 DIAGNOSIS — Z90710 Acquired absence of both cervix and uterus: Secondary | ICD-10-CM

## 2021-07-20 DIAGNOSIS — J449 Chronic obstructive pulmonary disease, unspecified: Secondary | ICD-10-CM | POA: Diagnosis present

## 2021-07-20 DIAGNOSIS — F341 Dysthymic disorder: Secondary | ICD-10-CM | POA: Diagnosis present

## 2021-07-20 DIAGNOSIS — G2581 Restless legs syndrome: Secondary | ICD-10-CM | POA: Diagnosis not present

## 2021-07-20 DIAGNOSIS — J9621 Acute and chronic respiratory failure with hypoxia: Secondary | ICD-10-CM | POA: Diagnosis not present

## 2021-07-20 DIAGNOSIS — R0902 Hypoxemia: Secondary | ICD-10-CM

## 2021-07-20 DIAGNOSIS — J9601 Acute respiratory failure with hypoxia: Secondary | ICD-10-CM | POA: Diagnosis present

## 2021-07-20 DIAGNOSIS — Z23 Encounter for immunization: Secondary | ICD-10-CM

## 2021-07-20 DIAGNOSIS — Z7951 Long term (current) use of inhaled steroids: Secondary | ICD-10-CM

## 2021-07-20 DIAGNOSIS — Z66 Do not resuscitate: Secondary | ICD-10-CM | POA: Diagnosis not present

## 2021-07-20 DIAGNOSIS — R0602 Shortness of breath: Secondary | ICD-10-CM | POA: Diagnosis present

## 2021-07-20 LAB — TROPONIN I (HIGH SENSITIVITY)
Troponin I (High Sensitivity): 2 ng/L (ref <18)
Troponin I (High Sensitivity): 3 ng/L (ref <18)

## 2021-07-20 LAB — CBC WITH DIFFERENTIAL/PLATELET
Abs Immature Granulocytes: 0.01 10*3/uL (ref 0.00–0.07)
Basophils Absolute: 0 10*3/uL (ref 0.0–0.1)
Basophils Relative: 1 %
Eosinophils Absolute: 0.2 10*3/uL (ref 0.0–0.5)
Eosinophils Relative: 3 %
HCT: 43.2 % (ref 36.0–46.0)
Hemoglobin: 13.7 g/dL (ref 12.0–15.0)
Immature Granulocytes: 0 %
Lymphocytes Relative: 47 %
Lymphs Abs: 3.1 10*3/uL (ref 0.7–4.0)
MCH: 29.1 pg (ref 26.0–34.0)
MCHC: 31.7 g/dL (ref 30.0–36.0)
MCV: 91.7 fL (ref 80.0–100.0)
Monocytes Absolute: 0.5 10*3/uL (ref 0.1–1.0)
Monocytes Relative: 7 %
Neutro Abs: 2.7 10*3/uL (ref 1.7–7.7)
Neutrophils Relative %: 42 %
Platelets: 269 10*3/uL (ref 150–400)
RBC: 4.71 MIL/uL (ref 3.87–5.11)
RDW: 14 % (ref 11.5–15.5)
WBC: 6.5 10*3/uL (ref 4.0–10.5)
nRBC: 0 % (ref 0.0–0.2)

## 2021-07-20 LAB — URINALYSIS, COMPLETE (UACMP) WITH MICROSCOPIC
Bacteria, UA: NONE SEEN
Bilirubin Urine: NEGATIVE
Glucose, UA: NEGATIVE mg/dL
Hgb urine dipstick: NEGATIVE
Ketones, ur: NEGATIVE mg/dL
Leukocytes,Ua: NEGATIVE
Nitrite: NEGATIVE
Protein, ur: NEGATIVE mg/dL
Specific Gravity, Urine: 1.019 (ref 1.005–1.030)
pH: 5 (ref 5.0–8.0)

## 2021-07-20 LAB — BASIC METABOLIC PANEL
Anion gap: 6 (ref 5–15)
BUN: 19 mg/dL (ref 8–23)
CO2: 32 mmol/L (ref 22–32)
Calcium: 8.7 mg/dL — ABNORMAL LOW (ref 8.9–10.3)
Chloride: 99 mmol/L (ref 98–111)
Creatinine, Ser: 1.01 mg/dL — ABNORMAL HIGH (ref 0.44–1.00)
GFR, Estimated: >60 mL/min (ref >60)
Glucose, Bld: 96 mg/dL (ref 70–99)
Potassium: 2.9 mmol/L — ABNORMAL LOW (ref 3.5–5.1)
Sodium: 137 mmol/L (ref 135–145)

## 2021-07-20 LAB — RESP PANEL BY RT-PCR (FLU A&B, COVID) ARPGX2
Influenza A by PCR: NEGATIVE
Influenza B by PCR: NEGATIVE
SARS Coronavirus 2 by RT PCR: NEGATIVE

## 2021-07-20 LAB — MAGNESIUM: Magnesium: 2.3 mg/dL (ref 1.7–2.4)

## 2021-07-20 LAB — BRAIN NATRIURETIC PEPTIDE: B Natriuretic Peptide: 31.2 pg/mL (ref 0.0–100.0)

## 2021-07-20 LAB — LACTIC ACID, PLASMA: Lactic Acid, Venous: 1 mmol/L (ref 0.5–1.9)

## 2021-07-20 MED ORDER — FUROSEMIDE 10 MG/ML IJ SOLN
20.0000 mg | Freq: Once | INTRAMUSCULAR | Status: AC
  Administered 2021-07-20: 20 mg via INTRAVENOUS
  Filled 2021-07-20: qty 4

## 2021-07-20 MED ORDER — TRAZODONE HCL 50 MG PO TABS
50.0000 mg | ORAL_TABLET | Freq: Every day | ORAL | Status: DC
  Administered 2021-07-20: 50 mg via ORAL
  Filled 2021-07-20: qty 1

## 2021-07-20 MED ORDER — METHYLPREDNISOLONE SODIUM SUCC 125 MG IJ SOLR
60.0000 mg | Freq: Two times a day (BID) | INTRAMUSCULAR | Status: DC
  Administered 2021-07-21 (×2): 60 mg via INTRAVENOUS
  Filled 2021-07-20 (×2): qty 2

## 2021-07-20 MED ORDER — ENOXAPARIN SODIUM 40 MG/0.4ML IJ SOSY
40.0000 mg | PREFILLED_SYRINGE | INTRAMUSCULAR | Status: DC
  Administered 2021-07-20: 40 mg via SUBCUTANEOUS
  Filled 2021-07-20: qty 0.4

## 2021-07-20 MED ORDER — IPRATROPIUM-ALBUTEROL 0.5-2.5 (3) MG/3ML IN SOLN
3.0000 mL | Freq: Once | RESPIRATORY_TRACT | Status: AC
  Administered 2021-07-20: 3 mL via RESPIRATORY_TRACT
  Filled 2021-07-20: qty 3

## 2021-07-20 MED ORDER — ASPIRIN 81 MG PO CHEW
81.0000 mg | CHEWABLE_TABLET | Freq: Every day | ORAL | Status: DC
  Administered 2021-07-21: 81 mg via ORAL
  Filled 2021-07-20: qty 1

## 2021-07-20 MED ORDER — BUDESONIDE 0.25 MG/2ML IN SUSP
0.2500 mg | Freq: Two times a day (BID) | RESPIRATORY_TRACT | Status: DC
  Administered 2021-07-20 – 2021-07-21 (×3): 0.25 mg via RESPIRATORY_TRACT
  Filled 2021-07-20 (×3): qty 2

## 2021-07-20 MED ORDER — DOXYCYCLINE HYCLATE 100 MG PO TABS
100.0000 mg | ORAL_TABLET | Freq: Two times a day (BID) | ORAL | Status: DC
  Administered 2021-07-20 – 2021-07-21 (×2): 100 mg via ORAL
  Filled 2021-07-20 (×3): qty 1

## 2021-07-20 MED ORDER — GUAIFENESIN-DM 100-10 MG/5ML PO SYRP: 5.0000 mL | ORAL_SOLUTION | ORAL | Status: DC | PRN

## 2021-07-20 MED ORDER — POTASSIUM CHLORIDE CRYS ER 20 MEQ PO TBCR
40.0000 meq | EXTENDED_RELEASE_TABLET | Freq: Once | ORAL | Status: AC
  Administered 2021-07-20: 40 meq via ORAL
  Filled 2021-07-20: qty 2

## 2021-07-20 MED ORDER — ACETAMINOPHEN 650 MG RE SUPP: 650.0000 mg | Freq: Four times a day (QID) | RECTAL | Status: DC | PRN

## 2021-07-20 MED ORDER — NICOTINE 7 MG/24HR TD PT24: 7.0000 mg | MEDICATED_PATCH | Freq: Every day | TRANSDERMAL | Status: DC

## 2021-07-20 MED ORDER — IPRATROPIUM-ALBUTEROL 0.5-2.5 (3) MG/3ML IN SOLN
3.0000 mL | Freq: Four times a day (QID) | RESPIRATORY_TRACT | Status: DC
  Administered 2021-07-20: 3 mL via RESPIRATORY_TRACT
  Filled 2021-07-20: qty 3

## 2021-07-20 MED ORDER — ALBUTEROL SULFATE (2.5 MG/3ML) 0.083% IN NEBU: 2.5000 mg | INHALATION_SOLUTION | RESPIRATORY_TRACT | Status: DC | PRN

## 2021-07-20 MED ORDER — ACETAMINOPHEN 325 MG PO TABS: 650.0000 mg | ORAL_TABLET | Freq: Four times a day (QID) | ORAL | Status: DC | PRN

## 2021-07-20 MED ORDER — METHYLPREDNISOLONE SODIUM SUCC 125 MG IJ SOLR
125.0000 mg | Freq: Once | INTRAMUSCULAR | Status: AC
  Administered 2021-07-20: 125 mg via INTRAVENOUS
  Filled 2021-07-20: qty 2

## 2021-07-20 NOTE — Discharge Instructions (Signed)
Please go to the hospital as soon as you leave urgent care for further evaluation and management. 

## 2021-07-20 NOTE — ED Triage Notes (Signed)
Pt c/o cough, shob and low O2 sat.  Reports sx began after getting flu shot and shingles last Thursday.  Has a child at home w/ similar sx.  O2 is 88% on RA.  Pt placed on 2L supplemental O2 w/ improvement to 92%.  Pt speaking in full sentences.

## 2021-07-20 NOTE — H&P (Addendum)
History and Physical    [name] WUJ:[id number] DOB: 1955-12-16 DOA: 07/20/2021  PCP: Grayce Sessions, NP Patient coming from: Urgent care  Chief Complaint: Shortness of breath, cough  HPI: [name] is a 64 y.o. female with medical history significant of COPD, nocturnal hypoxemia on 2 L oxygen at home, tobacco use, hypertension, RLS, Graves' disease status post RAI ablation in 2013, depression, anxiety presenting to the ED from urgent care for evaluation of shortness of breath and cough.  Oxygen saturations in the mid to high 80s on room air, improved with 2 L supplemental oxygen.  Wheezing on arrival to the ED.  Labs showing no leukocytosis.  Potassium 2.9.  Creatinine 1.0, stable.  Calcium 8.7.  Lactic acid normal.  EKG showing new diffuse T wave abnormality.  High-sensitivity troponin negative x2.  BNP normal.  COVID and influenza PCR negative.  Blood culture x2 drawn.  Chest x-ray showing cardiomegaly with vascular congestion and mild interstitial edema. Patient was given bronchodilator treatments, IV Solu-Medrol 125 mg, and oral potassium 40 mEq.  Patient reports shortness of breath and cough for the past few days.  Symptoms started after she was exposed to a family member who was coughing.  Denies fevers or chest pain.  States she is always wheezing.  She uses 2 L oxygen at night, no oxygen in the daytime.  States her oxygen saturation is usually around 88%.  She uses albuterol inhaler as needed.  Not using Symbicort.  Patient is also endorsing urinary frequency and low back pain.  Reports history of UTI 3 months ago.  Review of Systems:  All systems reviewed and apart from history of presenting illness, are negative.  Past Medical History:  Diagnosis Date   COPD with asthma (HCC)    currently followed by pcp (previous as seen pulmonogist --- dr byrum lov note in epic 10-04-2017),  last exacerbation with pneumonia 04/ 2022 pt stated completed antibiotic , no residual)   DOE  (dyspnea on exertion)    02-15-2021  per pt only with hills/ stairs , no issue with other activities   History of hyperthyroidism    s/p  RAI 12/ 2013,  followed by pcp   Hypertension    Nocturnal hypoxemia    uses 2L oxygen at night only,  (02-15-2021 pt stated O2 sat can get down 70--88%,  stated during the day O2 sat on RA are normal)   Nocturnal leg cramps    occasional   RLS (restless legs syndrome)    Wears dentures    upper    Past Surgical History:  Procedure Laterality Date   ABDOMINAL HYSTERECTOMY  1989   WITH UNILATERAL SALPINGOOPHORECTOMY AND APPENDECTOMY   ADJUSTABLE SUTURE MANIPULATION Right 02/16/2021   Procedure: RIGHT MEDIAL RECTUS ADJUSTABLE SUTURE MANIPULATION;  Surgeon: Aura Camps, MD;  Location: [facility];  Service: Ophthalmology;  Laterality: Right;   MEDIAN RECTUS REPAIR Right 02/16/2021   Procedure: RIGHT LATERAL RECTUS RECESSION;  Surgeon: Aura Camps, MD;  Location: [facility];  Service: Ophthalmology;  Laterality: Right;   MUSCLE RECESSION AND RESECTION Right 02/16/2021   Procedure: RIGHT MEDIAL RECTUS RESECTION;  Surgeon: Aura Camps, MD;  Location: [facility];  Service: Ophthalmology;  Laterality: Right;   STRABISMUS SURGERY Left 02/16/2021   Procedure: LEFT INFERIOR OBLIQUE RESECTION;  Surgeon: Aura Camps, MD;  Location: [facility];  Service: Ophthalmology;  Laterality: Left;     reports that she has been smoking cigarettes. She has never  used smokeless tobacco. She reports that she does not drink alcohol and does not use drugs.  No Known Allergies  Family History  Problem Relation Age of Onset   Kidney failure Mother    Heart failure Mother    Heart attack Father    Heart attack Brother     Prior to Admission medications   Medication Sig Start Date End Date Taking? Authorizing Provider  acetaminophen-codeine (TYLENOL #2) 300-15 MG tablet Take 1 tablet by mouth every  4 (four) hours as needed for moderate pain. 02/16/21   Aura Camps, MD  albuterol (PROVENTIL HFA;VENTOLIN HFA) 108 (90 BASE) MCG/ACT inhaler Inhale 2 puffs into the lungs every 6 (six) hours as needed for wheezing or shortness of breath.    [provider]  albuterol (PROVENTIL) (5 MG/ML) 0.5% nebulizer solution Take 0.5 mLs (2.5 mg total) by nebulization every 6 (six) hours as needed for wheezing or shortness of breath. 03/03/16   Standley Brooking, MD  aspirin 81 MG chewable tablet Chew 81 mg by mouth daily.    [provider]  budesonide-formoterol (SYMBICORT) 160-4.5 MCG/ACT inhaler Inhale 2 puffs into the lungs 2 (two) times daily. 11/25/18   Pokhrel, Rebekah Chesterfield, MD  Cholecalciferol (VITAMIN D) 50 MCG (2000 UT) tablet Take 2,000 Units by mouth daily.    [provider]  escitalopram (LEXAPRO) 10 MG tablet Take 10 mg by mouth at bedtime. 09/18/18   [provider]  hydrochlorothiazide (HYDRODIURIL) 25 MG tablet Take 25 mg by mouth daily. 09/03/15   [provider]  Multiple Vitamin (MULTIVITAMIN WITH MINERALS) TABS tablet Take 1 tablet by mouth daily.    [provider]  OXYGEN Inhale 2 L into the lungs at bedtime. Per pt prescribed as night time only, does not use as supplemental during the day    [provider]  Probiotic Product (PROBIOTIC DAILY) CAPS Take 1 capsule by mouth daily.    [provider]  tobramycin-dexamethasone Wall[facility] Dales) ophthalmic ointment Place 1 application into the left eye 2 (two) times daily at 10 am and 4 pm. 02/16/21   Aura Camps, MD  tobramycin-dexamethasone [facility]) ophthalmic ointment Place 1 application into the left eye 2 (two) times daily at 10 am and 4 pm. 02/16/21   Aura Camps, MD    Physical Exam: Vitals:   07/20/21 2000 07/20/21 2015 07/20/21 2030 07/20/21 2045  BP: (!) 145/107 139/63 (!) 151/107 (!) 153/80  Pulse: 65 (!) 58 (!) 54 68  Resp: (!) 26 14 (!) 21 17  Temp:       TempSrc:      SpO2: 91% 93% (!) 89% 91%  Weight:      Height:        Physical Exam Constitutional:      General: She is not in acute distress. HENT:     Head: Normocephalic and atraumatic.  Eyes:     Extraocular Movements: Extraocular movements intact.     Conjunctiva/sclera: Conjunctivae normal.  Cardiovascular:     Rate and Rhythm: Normal rate and regular rhythm.     Pulses: Normal pulses.  Pulmonary:     Effort: Pulmonary effort is normal.     Breath sounds: Wheezing and rales present.     Comments: Diminished breath sounds bilaterally Abdominal:     General: Bowel sounds are normal. There is no distension.     Palpations: Abdomen is soft.     Tenderness: There is no abdominal tenderness.  Musculoskeletal:  General: No swelling or tenderness.     Cervical back: Normal range of motion and neck supple.  Skin:    General: Skin is warm and dry.  Neurological:     General: No focal deficit present.     Mental Status: She is alert and oriented to person, place, and time.     Labs on Admission: I have personally reviewed following labs and imaging studies  CBC: Recent Labs  Lab 07/20/21 1803  WBC 6.5  NEUTROABS 2.7  HGB 13.7  HCT 43.2  MCV 91.7  PLT 269   Basic Metabolic Panel: Recent Labs  Lab 07/20/21 1803  NA 137  K 2.9*  CL 99  CO2 32  GLUCOSE 96  BUN 19  CREATININE 1.01*  CALCIUM 8.7*   GFR: Estimated Creatinine Clearance: 62.3 mL/min (A) (by C-G formula based on SCr of 1.01 mg/dL (H)). Liver Function Tests: No results for input(s): AST, ALT, ALKPHOS, BILITOT, PROT, ALBUMIN in the last 168 hours. No results for input(s): LIPASE, AMYLASE in the last 168 hours. No results for input(s): AMMONIA in the last 168 hours. Coagulation Profile: No results for input(s): INR, PROTIME in the last 168 hours. Cardiac Enzymes: No results for input(s): CKTOTAL, CKMB, CKMBINDEX, TROPONINI in the last 168 hours. BNP (last 3 results) No results for  input(s): PROBNP in the last 8760 hours. HbA1C: No results for input(s): HGBA1C in the last 72 hours. CBG: No results for input(s): GLUCAP in the last 168 hours. Lipid Profile: No results for input(s): CHOL, HDL, LDLCALC, TRIG, CHOLHDL, LDLDIRECT in the last 72 hours. Thyroid Function Tests: No results for input(s): TSH, T4TOTAL, FREET4, T3FREE, THYROIDAB in the last 72 hours. Anemia Panel: No results for input(s): VITAMINB12, FOLATE, FERRITIN, TIBC, IRON, RETICCTPCT in the last 72 hours. Urine analysis:    Component Value Date/Time   COLORURINE YELLOW 11/23/2018 0052   APPEARANCEUR CLEAR 11/23/2018 0052   LABSPEC 1.005 11/23/2018 0052   PHURINE 6.0 11/23/2018 0052   GLUCOSEU NEGATIVE 11/23/2018 0052   HGBUR NEGATIVE 11/23/2018 0052   HGBUR negative 03/25/2009 0943   BILIRUBINUR NEGATIVE 11/23/2018 0052   KETONESUR NEGATIVE 11/23/2018 0052   PROTEINUR NEGATIVE 11/23/2018 0052   UROBILINOGEN 0.2 03/09/2014 2122   NITRITE NEGATIVE 11/23/2018 0052   LEUKOCYTESUR NEGATIVE 11/23/2018 0052    Radiological Exams on Admission: DG Chest Port 1 View  Result Date: 07/20/2021 CLINICAL DATA:  Cough short of breath EXAM: PORTABLE CHEST 1 VIEW COMPARISON:  11/22/2018 FINDINGS: Cardiomegaly with vascular congestion and mild interstitial edema. Aortic atherosclerosis. No consolidation, pleural effusion or pneumothorax. IMPRESSION: Cardiomegaly with vascular congestion and mild interstitial edema Electronically Signed   By: Jasmine Pang M.D.   On: 07/20/2021 19:19    EKG: Independently reviewed.  Sinus rhythm, new diffuse T wave abnormality, QTC 514.  Assessment/Plan Principal Problem:   Acute hypoxemic respiratory failure (HCC) Active Problems:   DEPRESSION/ANXIETY   HYPERTENSION, BENIGN ESSENTIAL   Hypokalemia   QT prolongation   Acute on chronic hypoxemic respiratory failure secondary to acute COPD exacerbation and suspected new onset CHF Patient uses 2 L home oxygen (nighttime only).   Satting in the mid to upper 80s on room air at urgent care.  Wheezing on arrival to the ED.  She is not using Symbicort at home.  Currently satting around 90% on 3 L supplemental oxygen, no respiratory distress.  COVID and influenza PCR negative.  Chest x-ray showing cardiomegaly with vascular congestion and mild interstitial edema.  BNP normal.  No  prior echocardiogram results in the chart. -IV Lasix 20 mg x 1.  Echocardiogram ordered.  IV Solu-Medrol 60 mg every 12 hours, DuoNeb every 6 hours, Pulmicort neb twice daily, albuterol neb every 2 hours as needed.  Robitussin DM as needed for cough.  Start doxycycline.  Continuous pulse ox.  Continue supplemental oxygen, wean as tolerated.  Hypokalemia QT prolongation Hypokalemia likely due to home diuretic use. -Keep potassium above 4 and magnesium above 2.  Avoid QT prolonging drugs if possible.  Repeat EKG in a.m.  EKG abnormality EKG showing new diffuse T wave abnormality.  ACS less likely as high-sensitivity troponin negative x2.  Patient is not endorsing chest pain. -Echocardiogram ordered  Abnormal lab value Calcium 8.7. -Check albumin level to calculate corrected calcium level  Hypertension Systolic currently in the 140s. -Hold home hydrochlorothiazide, IV Lasix given.  Depression and anxiety -Hold home Lexapro given QT prolongation.  Continue trazodone.  Addendum: Urinary frequency Patient is endorsing urinary frequency and low back pain.  Reports history of UTI 3 months ago. -UA ordered  DVT prophylaxis: Lovenox Code Status: Patient wishes to be DNR/DNI. Family Communication: No family available at this time. Disposition Plan: Status is: Inpatient  Remains inpatient appropriate because: Acute on chronic hypoxemic respiratory failure secondary to acute COPD exacerbation and suspected new onset CHF  Level of care: Level of care: Telemetry  The medical decision making on this patient was of high complexity and the patient is  at high risk for clinical deterioration, therefore this is a level 3 visit.  John Giovanni MD [facility]  If 7PM-7AM, please contact night-coverage [email or web address]  07/20/2021, 10:04 PM

## 2021-07-20 NOTE — ED Notes (Signed)
Pt ambulated to restroom and back to room with independent steady gait

## 2021-07-20 NOTE — ED Provider Notes (Signed)
[facility]-EMERGENCY DEPT Provider Note   CSN: [id number] Arrival date & time: 07/20/21  1622     History Chief Complaint  Patient presents with   Shortness of Breath    [name] is a 65 y.o. female.  Patient presents with chief complaint of cough congestion shortness of breath ongoing for the past 6 to 7 days.  Denies fevers denies vomiting or diarrhea.  She has a history of COPD on room air at baseline.  Does smoke intermittently.  Denies any chest pain or headache or abdominal pain.      Past Medical History:  Diagnosis Date   COPD with asthma (HCC)    currently followed by pcp (previous as seen pulmonogist --- dr Georga Kaufmann note in epic 10-04-2017),  last exacerbation with pneumonia 04/ 2022 pt stated completed antibiotic , no residual)   DOE (dyspnea on exertion)    02-15-2021  per pt only with hills/ stairs , no issue with other activities   History of hyperthyroidism    s/p  RAI 12/ 2013,  followed by pcp   Hypertension    Nocturnal hypoxemia    uses 2L oxygen at night only,  (02-15-2021 pt stated O2 sat can get down 70--88%,  stated during the day O2 sat on RA are normal)   Nocturnal leg cramps    occasional   RLS (restless legs syndrome)    Wears dentures    upper    Patient Active Problem List   Diagnosis Date Noted   Acute respiratory failure with hypoxia (HCC) 11/23/2018   AKI (acute kidney injury) (HCC) 11/23/2018   CAP (community acquired pneumonia) 11/23/2018   Sepsis (HCC) 11/23/2018   Diarrhea 11/23/2018   Dehydration 11/23/2018   Hypokalemia 11/23/2018   COPD (chronic obstructive pulmonary disease) (HCC) 09/06/2015   Nocturnal hypoxemia 09/06/2015   Nicotine dependence 09/06/2015   RLS (restless legs syndrome) 06/22/2015   Nocturnal leg cramps 06/22/2015   DEPRESSION/ANXIETY 06/30/2010   OTHER CONVULSIONS 06/30/2010   TORTICOLLIS 06/16/2010   ONYCHOMYCOSIS 03/01/2009   TOBACCO ABUSE 11/20/2008   ASTHMA 11/20/2008    HYPERTENSION, BENIGN ESSENTIAL 09/11/2005    Past Surgical History:  Procedure Laterality Date   ABDOMINAL HYSTERECTOMY  1989   WITH UNILATERAL SALPINGOOPHORECTOMY AND APPENDECTOMY   ADJUSTABLE SUTURE MANIPULATION Right 02/16/2021   Procedure: RIGHT MEDIAL RECTUS ADJUSTABLE SUTURE MANIPULATION;  Surgeon: Aura Camps, MD;  Location: [facility];  Service: Ophthalmology;  Laterality: Right;   MEDIAN RECTUS REPAIR Right 02/16/2021   Procedure: RIGHT LATERAL RECTUS RECESSION;  Surgeon: Aura Camps, MD;  Location: [facility];  Service: Ophthalmology;  Laterality: Right;   MUSCLE RECESSION AND RESECTION Right 02/16/2021   Procedure: RIGHT MEDIAL RECTUS RESECTION;  Surgeon: Aura Camps, MD;  Location: [facility];  Service: Ophthalmology;  Laterality: Right;   STRABISMUS SURGERY Left 02/16/2021   Procedure: LEFT INFERIOR OBLIQUE RESECTION;  Surgeon: Aura Camps, MD;  Location: [facility];  Service: Ophthalmology;  Laterality: Left;     OB History   No obstetric history on file.     Family History  Problem Relation Age of Onset   Kidney failure Mother    Heart failure Mother    Heart attack Father    Heart attack Brother     Social History   Tobacco Use   Smoking status: Some Days    Years: 35.00    Types: Cigarettes   Smokeless tobacco: Never   Tobacco comments:  02-15-2021  per pt 1ppwk , down from 1ppd  Vaping Use   Vaping Use: Never used  Substance Use Topics   Alcohol use: No   Drug use: Never    Home Medications Prior to Admission medications   Medication Sig Start Date End Date Taking? Authorizing Provider  acetaminophen-codeine (TYLENOL #2) 300-15 MG tablet Take 1 tablet by mouth every 4 (four) hours as needed for moderate pain. 02/16/21   Aura Camps, MD  albuterol (PROVENTIL HFA;VENTOLIN HFA) 108 (90 BASE) MCG/ACT inhaler Inhale 2 puffs into the lungs every 6 (six) hours as needed for  wheezing or shortness of breath.    [provider]  albuterol (PROVENTIL) (5 MG/ML) 0.5% nebulizer solution Take 0.5 mLs (2.5 mg total) by nebulization every 6 (six) hours as needed for wheezing or shortness of breath. 03/03/16   Standley Brooking, MD  aspirin 81 MG chewable tablet Chew 81 mg by mouth daily.    [provider]  budesonide-formoterol (SYMBICORT) 160-4.5 MCG/ACT inhaler Inhale 2 puffs into the lungs 2 (two) times daily. 11/25/18   Pokhrel, Rebekah [place], MD  Cholecalciferol (VITAMIN D) 50 MCG (2000 UT) tablet Take 2,000 Units by mouth daily.    [provider]  escitalopram (LEXAPRO) 10 MG tablet Take 10 mg by mouth at bedtime. 09/18/18   [provider]  hydrochlorothiazide (HYDRODIURIL) 25 MG tablet Take 25 mg by mouth daily. 09/03/15   [provider]  Multiple Vitamin (MULTIVITAMIN WITH MINERALS) TABS tablet Take 1 tablet by mouth daily.    [provider]  OXYGEN Inhale 2 L into the lungs at bedtime. Per pt prescribed as night time only, does not use as supplemental during the day    [provider]  Probiotic Product (PROBIOTIC DAILY) CAPS Take 1 capsule by mouth daily.    [provider]  tobramycin-dexamethasone [facility]) ophthalmic ointment Place 1 application into the left eye 2 (two) times daily at 10 am and 4 pm. 02/16/21   Aura Camps, MD  tobramycin-dexamethasone [facility]) ophthalmic ointment Place 1 application into the left eye 2 (two) times daily at 10 am and 4 pm. 02/16/21   Aura Camps, MD    Allergies    Patient has no known allergies.  Review of Systems   Review of Systems  Constitutional:  Negative for fever.  HENT:  Negative for ear pain.   Eyes:  Negative for pain.  Respiratory:  Positive for cough and shortness of breath.   Cardiovascular:  Negative for chest pain.  Gastrointestinal:  Negative for abdominal pain.  Genitourinary:  Negative for flank pain.  Musculoskeletal:  Negative  for back pain.  Skin:  Negative for rash.  Neurological:  Negative for headaches.   Physical Exam Updated Vital Signs BP (!) 153/80   Pulse 68   Temp 98.4 F (36.9 C) (Oral)   Resp 17   Ht 5\' 6"  (1.676 m)   Wt 86.2 kg   SpO2 91%   BMI 30.67 kg/m   Physical Exam Constitutional:      General: She is not in acute distress.    Appearance: Normal appearance.  HENT:     Head: Normocephalic.     Nose: Nose normal.  Eyes:     Extraocular Movements: Extraocular movements intact.  Cardiovascular:     Rate and Rhythm: Normal rate.  Pulmonary:     Breath sounds: Decreased breath sounds and wheezing present.  Musculoskeletal:        General: Normal range of motion.  Cervical back: Normal range of motion.  Neurological:     General: No focal deficit present.     Mental Status: She is alert. Mental status is at baseline.    ED Results / Procedures / Treatments   Labs (all labs ordered are listed, but only abnormal results are displayed) Labs Reviewed  BASIC METABOLIC PANEL - Abnormal; Notable for the following components:      Result Value   Potassium 2.9 (*)    Creatinine, Ser 1.01 (*)    Calcium 8.7 (*)    All other components within normal limits  RESP PANEL BY RT-PCR (FLU A&B, COVID) ARPGX2  CULTURE, BLOOD (ROUTINE X 2)  CULTURE, BLOOD (ROUTINE X 2)  CBC WITH DIFFERENTIAL/PLATELET  LACTIC ACID, PLASMA  BRAIN NATRIURETIC PEPTIDE  TROPONIN I (HIGH SENSITIVITY)  TROPONIN I (HIGH SENSITIVITY)    EKG EKG Interpretation  Date/Time:  Wednesday July 20 2021 17:02:59 EST Ventricular Rate:  61 PR Interval:  145 QRS Duration: 93 QT Interval:  510 QTC Calculation: 514 R Axis:   114 Text Interpretation: Sinus rhythm Right axis deviation Nonspecific T abnormalities, lateral leads Prolonged QT interval Confirmed by Norman Clay ([id number]) on 07/20/2021 5:29:00 PM  Radiology DG Chest Port 1 View  Result Date: 07/20/2021 CLINICAL DATA:  Cough short of breath EXAM:  PORTABLE CHEST 1 VIEW COMPARISON:  11/22/2018 FINDINGS: Cardiomegaly with vascular congestion and mild interstitial edema. Aortic atherosclerosis. No consolidation, pleural effusion or pneumothorax. IMPRESSION: Cardiomegaly with vascular congestion and mild interstitial edema Electronically Signed   By: Jasmine Pang M.D.   On: 07/20/2021 19:19    Procedures .Critical Care Performed by: Cheryll Cockayne, MD Authorized by: Cheryll Cockayne, MD   Critical care provider statement:    Critical care time (minutes):  40   Critical care time was exclusive of:  Separately billable procedures and treating other patients and teaching time   Critical care was necessary to treat or prevent imminent or life-threatening deterioration of the following conditions:  Respiratory failure   Medications Ordered in ED Medications  methylPREDNISolone sodium succinate (SOLU-MEDROL) 125 mg/2 mL injection 125 mg (125 mg Intravenous Given 07/20/21 1830)  ipratropium-albuterol (DUONEB) 0.5-2.5 (3) MG/3ML nebulizer solution 3 mL (3 mLs Nebulization Given 07/20/21 1832)  potassium chloride SA (KLOR-CON) CR tablet 40 mEq (40 mEq Oral Given 07/20/21 1947)  ipratropium-albuterol (DUONEB) 0.5-2.5 (3) MG/3ML nebulizer solution 3 mL (3 mLs Nebulization Given 07/20/21 2043)    ED Course  I have reviewed the triage vital signs and the nursing notes.  Pertinent labs & imaging results that were available during my care of the patient were reviewed by me and considered in my medical decision making (see chart for details).    MDM Rules/Calculators/A&P                           Patient is hypoxemic on arrival 88% on room air which is low, doing well on 2 L nasal cannula supplementation.  Diffuse wheezes on lung exam.  Labs are sent white count normal chemistry normal proBNP normal as well, chest x-ray concerning for cardiomegaly but no evidence of pneumonia.  Patient given Solu-Medrol breathing treatments with persistent wheezing  hypoxemia.  Will be brought into the hospitalist team.  Final Clinical Impression(s) / ED Diagnoses Final diagnoses:  COPD exacerbation (HCC)  Hypoxemia    Rx / DC Orders ED Discharge Orders     None  Cheryll Cockayne, MD 07/20/21 2108

## 2021-07-20 NOTE — ED Provider Notes (Signed)
[facility]    CSN: [id number] Arrival date & time: 07/20/21  1038      History   Chief Complaint Chief Complaint  Patient presents with   Cough    HPI [name] is a 65 y.o. female.   Patient presents with nonproductive cough and shortness of breath that has been present for a few days.  Patient has had intermittent shortness of breath.  Patient does have history of COPD.  Patient reports that she wears oxygen only at night.  Family member has similar symptoms.   Cough  Past Medical History:  Diagnosis Date   COPD with asthma (HCC)    currently followed by pcp (previous as seen pulmonogist --- dr Georga Kaufmann note in epic 10-04-2017),  last exacerbation with pneumonia 04/ 2022 pt stated completed antibiotic , no residual)   DOE (dyspnea on exertion)    02-15-2021  per pt only with hills/ stairs , no issue with other activities   History of hyperthyroidism    s/p  RAI 12/ 2013,  followed by pcp   Hypertension    Nocturnal hypoxemia    uses 2L oxygen at night only,  (02-15-2021 pt stated O2 sat can get down 70--88%,  stated during the day O2 sat on RA are normal)   Nocturnal leg cramps    occasional   RLS (restless legs syndrome)    Wears dentures    upper    Patient Active Problem List   Diagnosis Date Noted   Acute respiratory failure with hypoxia (HCC) 11/23/2018   AKI (acute kidney injury) (HCC) 11/23/2018   CAP (community acquired pneumonia) 11/23/2018   Sepsis (HCC) 11/23/2018   Diarrhea 11/23/2018   Dehydration 11/23/2018   Hypokalemia 11/23/2018   COPD (chronic obstructive pulmonary disease) (HCC) 09/06/2015   Nocturnal hypoxemia 09/06/2015   Nicotine dependence 09/06/2015   RLS (restless legs syndrome) 06/22/2015   Nocturnal leg cramps 06/22/2015   DEPRESSION/ANXIETY 06/30/2010   OTHER CONVULSIONS 06/30/2010   TORTICOLLIS 06/16/2010   ONYCHOMYCOSIS 03/01/2009   TOBACCO ABUSE 11/20/2008   ASTHMA 11/20/2008   HYPERTENSION, BENIGN  ESSENTIAL 09/11/2005    Past Surgical History:  Procedure Laterality Date   ABDOMINAL HYSTERECTOMY  1989   WITH UNILATERAL SALPINGOOPHORECTOMY AND APPENDECTOMY   ADJUSTABLE SUTURE MANIPULATION Right 02/16/2021   Procedure: RIGHT MEDIAL RECTUS ADJUSTABLE SUTURE MANIPULATION;  Surgeon: Aura Camps, MD;  Location: [facility];  Service: Ophthalmology;  Laterality: Right;   MEDIAN RECTUS REPAIR Right 02/16/2021   Procedure: RIGHT LATERAL RECTUS RECESSION;  Surgeon: Aura Camps, MD;  Location: [facility];  Service: Ophthalmology;  Laterality: Right;   MUSCLE RECESSION AND RESECTION Right 02/16/2021   Procedure: RIGHT MEDIAL RECTUS RESECTION;  Surgeon: Aura Camps, MD;  Location: [facility];  Service: Ophthalmology;  Laterality: Right;   STRABISMUS SURGERY Left 02/16/2021   Procedure: LEFT INFERIOR OBLIQUE RESECTION;  Surgeon: Aura Camps, MD;  Location: [facility];  Service: Ophthalmology;  Laterality: Left;    OB History   No obstetric history on file.      Home Medications    Prior to Admission medications   Medication Sig Start Date End Date Taking? Authorizing Provider  acetaminophen-codeine (TYLENOL #2) 300-15 MG tablet Take 1 tablet by mouth every 4 (four) hours as needed for moderate pain. 02/16/21   Aura Camps, MD  albuterol (PROVENTIL HFA;VENTOLIN HFA) 108 (90 BASE) MCG/ACT inhaler Inhale 2 puffs into the lungs every 6 (six) hours as needed for  wheezing or shortness of breath.    [provider]  albuterol (PROVENTIL) (5 MG/ML) 0.5% nebulizer solution Take 0.5 mLs (2.5 mg total) by nebulization every 6 (six) hours as needed for wheezing or shortness of breath. 03/03/16   Samuella Cota, MD  aspirin 81 MG chewable tablet Chew 81 mg by mouth daily.    [provider]  budesonide-formoterol (SYMBICORT) 160-4.5 MCG/ACT inhaler Inhale 2 puffs into the lungs 2 (two) times daily. 11/25/18    Pokhrel, Corrie Mckusick, MD  Cholecalciferol (VITAMIN D) 50 MCG (2000 UT) tablet Take 2,000 Units by mouth daily.    [provider]  escitalopram (LEXAPRO) 10 MG tablet Take 10 mg by mouth at bedtime. 09/18/18   [provider]  hydrochlorothiazide (HYDRODIURIL) 25 MG tablet Take 25 mg by mouth daily. 09/03/15   [provider]  Multiple Vitamin (MULTIVITAMIN WITH MINERALS) TABS tablet Take 1 tablet by mouth daily.    [provider]  OXYGEN Inhale 2 L into the lungs at bedtime. Per pt prescribed as night time only, does not use as supplemental during the day    [provider]  Probiotic Product (PROBIOTIC DAILY) CAPS Take 1 capsule by mouth daily.    [provider]  tobramycin-dexamethasone [facility]) ophthalmic ointment Place 1 application into the left eye 2 (two) times daily at 10 am and 4 pm. 02/16/21   Gevena Cotton, MD  tobramycin-dexamethasone [facility]) ophthalmic ointment Place 1 application into the left eye 2 (two) times daily at 10 am and 4 pm. 02/16/21   Gevena Cotton, MD    Family History Family History  Problem Relation Age of Onset   Kidney failure Mother    Heart failure Mother    Heart attack Father    Heart attack Brother     Social History Social History   Tobacco Use   Smoking status: Some Days    Years: 35.00    Types: Cigarettes   Smokeless tobacco: Never   Tobacco comments:    02-15-2021  per pt 1ppwk , down from 1ppd  Vaping Use   Vaping Use: Never used  Substance Use Topics   Alcohol use: No   Drug use: Never     Allergies   Patient has no known allergies.   Review of Systems Review of Systems Per HPI  Physical Exam Triage Vital Signs ED Triage Vitals  Enc Vitals Group     BP 07/20/21 1231 135/85     Pulse Rate 07/20/21 1231 65     Resp 07/20/21 1231 16     Temp 07/20/21 1231 98.1 F (36.7 C)     Temp Source 07/20/21 1231 Oral     SpO2 07/20/21 1231 (!) 87 %     Weight --      Height  --      Head Circumference --      Peak Flow --      Pain Score 07/20/21 1233 8     Pain Loc --      Pain Edu? --      Excl. in [place]? --    No data found.  Updated Vital Signs BP 135/85 (BP Location: Left Arm)   Pulse 65   Temp 98.1 F (36.7 C) (Oral)   Resp 16   SpO2 (!) 87%   Visual Acuity Right Eye Distance:   Left Eye Distance:   Bilateral Distance:    Right Eye Near:   Left Eye Near:    Bilateral  Near:     Physical Exam Constitutional:      General: She is not in acute distress.    Appearance: Normal appearance. She is ill-appearing and toxic-appearing. She is not diaphoretic.  HENT:     Head: Normocephalic and atraumatic.     Right Ear: Tympanic membrane and ear canal normal.     Left Ear: Tympanic membrane and ear canal normal.     Nose: Congestion present.     Mouth/Throat:     Mouth: Mucous membranes are moist.     Pharynx: No posterior oropharyngeal erythema.  Eyes:     Extraocular Movements: Extraocular movements intact.     Conjunctiva/sclera: Conjunctivae normal.     Pupils: Pupils are equal, round, and reactive to light.  Cardiovascular:     Rate and Rhythm: Normal rate and regular rhythm.     Pulses: Normal pulses.     Heart sounds: Normal heart sounds.  Pulmonary:     Effort: Pulmonary effort is normal. No respiratory distress.     Breath sounds: No stridor. Wheezing and rhonchi present. No rales.  Abdominal:     General: Abdomen is flat. Bowel sounds are normal.     Palpations: Abdomen is soft.  Musculoskeletal:        General: Normal range of motion.     Cervical back: Normal range of motion.  Skin:    General: Skin is warm and dry.  Neurological:     General: No focal deficit present.     Mental Status: She is alert and oriented to person, place, and time. Mental status is at baseline.  Psychiatric:        Mood and Affect: Mood normal.        Behavior: Behavior normal.     UC Treatments / Results  Labs (all labs ordered are listed,  but only abnormal results are displayed) Labs Reviewed - No data to display  EKG   Radiology No results found.  Procedures Procedures (including critical care time)  Medications Ordered in UC Medications - No data to display  Initial Impression / Assessment and Plan / UC Course  I have reviewed the triage vital signs and the nursing notes.  Pertinent labs & imaging results that were available during my care of the patient were reviewed by me and considered in my medical decision making (see chart for details).     Patient's oxygen saturation ranging from mid 80s to high 80s.  No acute distress but patient does have severe adventitious lung sounds.  Advised patient that she will need to go to the hospital for further evaluation and management.  Patient was agreeable with plan.  Suggested EMS transport due to oxygen saturation but patient declined.  Risks associated with not going to the hospital via EMS were discussed with patient.  Patient voiced understanding.  Patient left via self transport to the hospital. Final Clinical Impressions(s) / UC Diagnoses   Final diagnoses:  Low oxygen saturation  Shortness of breath     Discharge Instructions      Please go to the hospital as soon as you leave urgent care for further evaluation and management.     ED Prescriptions   None    PDMP not reviewed this encounter.   Teodora Medici, [place] 07/20/21 1351

## 2021-07-20 NOTE — ED Triage Notes (Addendum)
Cough, shortness of breath, frequent urination, and low back pain. Hx of COPD

## 2021-07-21 ENCOUNTER — Inpatient Hospital Stay (HOSPITAL_BASED_OUTPATIENT_CLINIC_OR_DEPARTMENT_OTHER): Payer: Medicare Other

## 2021-07-21 DIAGNOSIS — J441 Chronic obstructive pulmonary disease with (acute) exacerbation: Principal | ICD-10-CM

## 2021-07-21 DIAGNOSIS — R0602 Shortness of breath: Secondary | ICD-10-CM | POA: Diagnosis not present

## 2021-07-21 DIAGNOSIS — I509 Heart failure, unspecified: Secondary | ICD-10-CM | POA: Diagnosis not present

## 2021-07-21 DIAGNOSIS — Z66 Do not resuscitate: Secondary | ICD-10-CM | POA: Diagnosis not present

## 2021-07-21 DIAGNOSIS — J9621 Acute and chronic respiratory failure with hypoxia: Secondary | ICD-10-CM | POA: Diagnosis not present

## 2021-07-21 DIAGNOSIS — J9622 Acute and chronic respiratory failure with hypercapnia: Secondary | ICD-10-CM | POA: Diagnosis not present

## 2021-07-21 DIAGNOSIS — Z23 Encounter for immunization: Secondary | ICD-10-CM | POA: Diagnosis not present

## 2021-07-21 LAB — COMPREHENSIVE METABOLIC PANEL
ALT: 12 U/L (ref 0–44)
AST: 18 U/L (ref 15–41)
Albumin: 3.6 g/dL (ref 3.5–5.0)
Alkaline Phosphatase: 64 U/L (ref 38–126)
Anion gap: 8 (ref 5–15)
BUN: 22 mg/dL (ref 8–23)
CO2: 31 mmol/L (ref 22–32)
Calcium: 8.8 mg/dL — ABNORMAL LOW (ref 8.9–10.3)
Chloride: 100 mmol/L (ref 98–111)
Creatinine, Ser: 0.89 mg/dL (ref 0.44–1.00)
GFR, Estimated: >60 mL/min (ref >60)
Glucose, Bld: 143 mg/dL — ABNORMAL HIGH (ref 70–99)
Potassium: 3.7 mmol/L (ref 3.5–5.1)
Sodium: 139 mmol/L (ref 135–145)
Total Bilirubin: 0.5 mg/dL (ref 0.3–1.2)
Total Protein: 6.8 g/dL (ref 6.5–8.1)

## 2021-07-21 LAB — ECHOCARDIOGRAM COMPLETE
Area-P 1/2: 2.21 cm2
Height: 66 in
S' Lateral: 2.7 cm
Weight: 3040 oz

## 2021-07-21 LAB — HIV ANTIBODY (ROUTINE TESTING W REFLEX): HIV Screen 4th Generation wRfx: NONREACTIVE

## 2021-07-21 MED ORDER — IPRATROPIUM-ALBUTEROL 0.5-2.5 (3) MG/3ML IN SOLN
3.0000 mL | Freq: Three times a day (TID) | RESPIRATORY_TRACT | Status: DC
  Administered 2021-07-21 (×3): 3 mL via RESPIRATORY_TRACT
  Filled 2021-07-21 (×3): qty 3

## 2021-07-21 MED ORDER — PREDNISONE 10 MG PO TABS: 40.0000 mg | ORAL_TABLET | Freq: Every day | ORAL | 0 refills | Status: AC

## 2021-07-21 MED ORDER — COVID-19MRNA BIVAL VACC PFIZER 30 MCG/0.3ML IM SUSP
0.3000 mL | Freq: Once | INTRAMUSCULAR | Status: AC
  Administered 2021-07-21: 0.3 mL via INTRAMUSCULAR
  Filled 2021-07-21: qty 0.3

## 2021-07-21 MED ORDER — POTASSIUM CHLORIDE CRYS ER 20 MEQ PO TBCR
40.0000 meq | EXTENDED_RELEASE_TABLET | Freq: Once | ORAL | Status: AC
  Administered 2021-07-21: 40 meq via ORAL
  Filled 2021-07-21: qty 2

## 2021-07-21 MED ORDER — ALBUTEROL SULFATE (5 MG/ML) 0.5% IN NEBU: INHALATION_SOLUTION | RESPIRATORY_TRACT | 0 refills | Status: AC

## 2021-07-21 MED ORDER — DOXYCYCLINE HYCLATE 100 MG PO TABS: 100.0000 mg | ORAL_TABLET | Freq: Two times a day (BID) | ORAL | 0 refills | Status: AC

## 2021-07-21 NOTE — Progress Notes (Signed)
Patient still wanting to go home today per report.  Messaging admitting hospitalist before accepting admission at this time.

## 2021-07-21 NOTE — Discharge Instructions (Signed)
[name],  You were in the hospital with a COPD exacerbation. You have improved with IV steroids, antibiotics and breathing treatments. Please continue treatment on discharge. Please follow-up with your PCP. We discussed smoking cessation but at this time, you are not interested in quitting. If you change your mind, please discuss with your PCP.

## 2021-07-21 NOTE — Progress Notes (Signed)
Pt is requesting Covid booster prior to discharge. MD approved administration.  Order placed.

## 2021-07-21 NOTE — Plan of Care (Signed)
Pt for discharge home this evening.  AVS printed and reviewed with patient at bedside.  Follow ups and new medications discussed in detail with patient.  IV and telemetry removed.

## 2021-07-21 NOTE — Plan of Care (Signed)
New admission [address], Bed 3.  Pt Aox4, pleasant and cooperative with staff.  VS rechecked, stable on 3LNC. Resting comfortably in bed no needs voiced at this time.  Antibiotics and steroids per MD orders.  Standard precautions, low fall risk.  Call bell in reach, discussed rounding. Denies any needs at this time.   Problem: Respiratory: Goal: Levels of oxygenation will improve Outcome: Progressing   Problem: Education: Goal: Knowledge of disease or condition will improve Outcome: Progressing   Problem: Safety: Goal: Ability to remain free from injury will improve Outcome: Progressing   Problem: Clinical Measurements: Goal: Ability to maintain clinical measurements within normal limits will improve Outcome: Progressing

## 2021-07-21 NOTE — Progress Notes (Signed)
Ambulatory pulse ox completed.  Pt walked about 129ft with RN supervision.  Pt required no oxygen during activity. SpO2 remained above 90% throughout.

## 2021-07-21 NOTE — Discharge Summary (Signed)
Physician Discharge Summary  [name] KDT:[id number] DOB: [birth year] DOA: 07/20/2021  PCP: Grayce Sessions, NP  Admit date: 07/20/2021 Discharge date: 07/21/2021  Admitted From: Home Disposition: Home  Recommendations for Outpatient Follow-up:  Follow up with PCP in 1 week Please follow up on the following pending results: None  Home Health: None Equipment/Devices: None  Discharge Condition: Stable CODE STATUS: Full code Diet recommendation: Regular diet   Brief/Interim Summary:  Admission HPI written by John Giovanni, MD   HPI: [name] is a 65 y.o. female with medical history significant of COPD, nocturnal hypoxemia on 2 L oxygen at home, tobacco use, hypertension, RLS, Graves' disease status post RAI ablation in 2013, depression, anxiety presenting to the ED from urgent care for evaluation of shortness of breath and cough.  Oxygen saturations in the mid to high 80s on room air, improved with 2 L supplemental oxygen.  Wheezing on arrival to the ED.  Labs showing no leukocytosis.  Potassium 2.9.  Creatinine 1.0, stable.  Calcium 8.7.  Lactic acid normal.  EKG showing new diffuse T wave abnormality.  High-sensitivity troponin negative x2.  BNP normal.  COVID and influenza PCR negative.  Blood culture x2 drawn.  Chest x-ray showing cardiomegaly with vascular congestion and mild interstitial edema. Patient was given bronchodilator treatments, IV Solu-Medrol 125 mg, and oral potassium 40 mEq.   Patient reports shortness of breath and cough for the past few days.  Symptoms started after she was exposed to a family member who was coughing.  Denies fevers or chest pain.  States she is always wheezing.  She uses 2 L oxygen at night, no oxygen in the daytime.  States her oxygen saturation is usually around 88%.  She uses albuterol inhaler as needed.  Not using Symbicort.  Patient is also endorsing urinary frequency and low back pain.  Reports history of UTI 3 months  ago.   Hospital course:  Acute on chronic respiratory failure with hypoxia Secondary to COPD exacerbation. Patient requiring about 3 L/min of oxygen on admission and weaned to room air. Patient ambulated on room air without significant drop in oxygen.  COPD exacerbation Patient treated with solumedrol, Duonebs, doxycycline, Pulmicort while inpatient with improvement of symptoms. Resume home albuterol and schedule q4 hours while awake for the next few days while on steroids. Discharge to complete 3 more days of prednisone and 4 more days of doxycycline.  Pulmonary vascular congestion Initial concern for possible heart failure, however Transthoracic Echocardiogram was normal. Symptoms above improved with treatment of COPD exacerbation however patient did get a dose of Lasix. No pulmonary edema noted on imaging.  Hypokalemia Supplementation given. Resolved.  QTc prolongation Resolved prior to discharge.  Abnormal EKG EKG with diffuse T-wave abnormalities. No associated chest pain and troponin trended negatively.   Primary hypertension Resume home hydrochlorothiazide  Depression Anxiety Continue Lexapro and Trazodone  Urinary frequency Urinalysis without suggestion of UTI   Discharge Diagnoses:  Principal Problem:   COPD with acute exacerbation (HCC) Active Problems:   DEPRESSION/ANXIETY   HYPERTENSION, BENIGN ESSENTIAL   COPD (chronic obstructive pulmonary disease) (HCC)   Hypokalemia   Acute hypoxemic respiratory failure (HCC)   QT prolongation    Discharge Instructions  Discharge Instructions     Call MD for:  difficulty breathing, headache or visual disturbances   Complete by: As directed    Increase activity slowly   Complete by: As directed       Allergies as of 07/21/2021   No  Known Allergies      Medication List     STOP taking these medications    acetaminophen-codeine 300-15 MG tablet Commonly known as: TYLENOL #2   TobraDex ophthalmic  ointment Generic drug: tobramycin-dexamethasone       TAKE these medications    albuterol (5 MG/ML) 0.5% nebulizer solution Commonly known as: PROVENTIL Take 0.5 mLs (2.5 mg total) by nebulization every 4 (four) hours while awake for 3 days, THEN 0.5 mLs (2.5 mg total) every 4 (four) hours as needed for wheezing or shortness of breath. Start taking on: July 21, 2021 What changed: See the new instructions.   albuterol 108 (90 Base) MCG/ACT inhaler Commonly known as: VENTOLIN HFA Inhale 2 puffs into the lungs every 6 (six) hours as needed for wheezing or shortness of breath. What changed: Another medication with the same name was changed. Make sure you understand how and when to take each.   aspirin 81 MG chewable tablet Chew 81 mg by mouth daily.   budesonide-formoterol 160-4.5 MCG/ACT inhaler Commonly known as: SYMBICORT Inhale 2 puffs into the lungs 2 (two) times daily.   chlorpheniramine-HYDROcodone 10-8 MG/5ML Suer Commonly known as: TUSSIONEX Take 2.5 mLs by mouth daily as needed for cough.   [employer] Mucus Extended Release 600 MG 12 hr tablet Generic drug: guaiFENesin Take 600 mg by mouth daily as needed for cough.   doxycycline 100 MG tablet Commonly known as: VIBRA-TABS Take 1 tablet (100 mg total) by mouth every 12 (twelve) hours for 4 days.   escitalopram 10 MG tablet Commonly known as: LEXAPRO Take 10 mg by mouth at bedtime.   fluticasone 50 MCG/ACT nasal spray Commonly known as: FLONASE Place 1 spray into both nostrils daily as needed for allergies or rhinitis.   hydrochlorothiazide 25 MG tablet Commonly known as: HYDRODIURIL Take 25 mg by mouth daily.   ibuprofen 800 MG tablet Commonly known as: ADVIL Take 800 mg by mouth every 8 (eight) hours as needed for moderate pain.   multivitamin with minerals Tabs tablet Take 1 tablet by mouth daily.   OXYGEN Inhale 2 L into the lungs at bedtime. Per pt prescribed as night time only, does not use as  supplemental during the day   predniSONE 10 MG tablet Commonly known as: DELTASONE Take 4 tablets (40 mg total) by mouth daily for 3 days. Start taking on: July 22, 2021   Probiotic Daily Caps Take 1 capsule by mouth daily.   traZODone 50 MG tablet Commonly known as: DESYREL Take 50 mg by mouth at bedtime.   Vitamin D 50 MCG (2000 UT) tablet Take 2,000 Units by mouth daily.        Follow-up Information     Grayce Sessions, NP. Schedule an appointment as soon as possible for a visit in 1 week(s).   Specialty: Internal Medicine Why: For hospital follow-up Contact information: [address] [place] [address] [phone]                No Known Allergies  Consultations: None   Procedures/Studies: DG Chest Port 1 View  Result Date: 07/20/2021 CLINICAL DATA:  Cough short of breath EXAM: PORTABLE CHEST 1 VIEW COMPARISON:  11/22/2018 FINDINGS: Cardiomegaly with vascular congestion and mild interstitial edema. Aortic atherosclerosis. No consolidation, pleural effusion or pneumothorax. IMPRESSION: Cardiomegaly with vascular congestion and mild interstitial edema Electronically Signed   By: Jasmine Pang M.D.   On: 07/20/2021 19:19   ECHOCARDIOGRAM COMPLETE  Result Date: 07/21/2021  ECHOCARDIOGRAM REPORT   Patient Name:   [name] Date of Exam: 07/21/2021 Medical Rec #:  [id number]     Height:       66.0 in Accession #:    [id number]    Weight:       190.0 lb Date of Birth:  1955-11-01    BSA:          1.957 m Patient Age:    64 years      BP:           128/77 mmHg Patient Gender: F             HR:           65 bpm. Exam Location:  Inpatient Procedure: 2D Echo, Cardiac Doppler and Color Doppler Indications:    CHF  History:        Patient has no prior history of Echocardiogram examinations.                 COPD, Signs/Symptoms:Shortness of Breath and Pneumonia; Risk                 Factors:Current Smoker, Hypertension and Obesity.  Sonographer:     Lavenia Atlas RDCS Referring Phys: [id number] VASUNDHRA RATHORE IMPRESSIONS  1. Left ventricular ejection fraction, by estimation, is 60 to 65%. The left ventricle has normal function. The left ventricle has no regional wall motion abnormalities. Left ventricular diastolic parameters were normal.  2. Right ventricular systolic function is normal. The right ventricular size is normal. There is normal pulmonary artery systolic pressure.  3. The mitral valve is normal in structure. No evidence of mitral valve regurgitation. No evidence of mitral stenosis.  4. The aortic valve is normal in structure. Aortic valve regurgitation is not visualized. No aortic stenosis is present.  5. The inferior vena cava is normal in size with greater than 50% respiratory variability, suggesting right atrial pressure of 3 mmHg. Comparison(s): No prior Echocardiogram. FINDINGS  Left Ventricle: Left ventricular ejection fraction, by estimation, is 60 to 65%. The left ventricle has normal function. The left ventricle has no regional wall motion abnormalities. The left ventricular internal cavity size was normal in size. There is  no left ventricular hypertrophy. Left ventricular diastolic parameters were normal. Right Ventricle: The right ventricular size is normal. No increase in right ventricular wall thickness. Right ventricular systolic function is normal. There is normal pulmonary artery systolic pressure. The tricuspid regurgitant velocity is 2.59 m/s, and  with an assumed right atrial pressure of 3 mmHg, the estimated right ventricular systolic pressure is 29.8 mmHg. Left Atrium: Left atrial size was normal in size. Right Atrium: Right atrial size was normal in size. Pericardium: There is no evidence of pericardial effusion. Presence of pericardial fat pad. Mitral Valve: The mitral valve is normal in structure. No evidence of mitral valve regurgitation. No evidence of mitral valve stenosis. Tricuspid Valve: The tricuspid valve is  normal in structure. Tricuspid valve regurgitation is not demonstrated. No evidence of tricuspid stenosis. Aortic Valve: The aortic valve is normal in structure. Aortic valve regurgitation is not visualized. No aortic stenosis is present. Pulmonic Valve: The pulmonic valve was normal in structure. Pulmonic valve regurgitation is trivial. No evidence of pulmonic stenosis. Aorta: The aortic root is normal in size and structure. Venous: The inferior vena cava is normal in size with greater than 50% respiratory variability, suggesting right atrial pressure of 3 mmHg. IAS/Shunts: No atrial level shunt detected by color flow Doppler.  LEFT  VENTRICLE PLAX 2D LVIDd:         4.40 cm   Diastology LVIDs:         2.70 cm   LV e' medial:    6.64 cm/s LV PW:         1.00 cm   LV E/e' medial:  11.0 LV IVS:        1.00 cm   LV e' lateral:   10.00 cm/s LVOT diam:     2.00 cm   LV E/e' lateral: 7.3 LV SV:         101 LV SV Index:   52 LVOT Area:     3.14 cm  RIGHT VENTRICLE RV Basal diam:  2.50 cm RV S prime:     5.66 cm/s TAPSE (M-mode): 2.4 cm LEFT ATRIUM             Index        RIGHT ATRIUM          Index LA diam:        3.50 cm 1.79 cm/m   RA Area:     9.91 cm LA Vol (A2C):   30.0 ml 15.33 ml/m  RA Volume:   19.30 ml 9.86 ml/m LA Vol (A4C):   35.3 ml 18.04 ml/m LA Biplane Vol: 34.8 ml 17.79 ml/m  AORTIC VALVE LVOT Vmax:   145.00 cm/s LVOT Vmean:  90.000 cm/s LVOT VTI:    0.321 m  AORTA Ao Root diam: 3.00 cm MITRAL VALVE               TRICUSPID VALVE MV Area (PHT): 2.21 cm    TR Peak grad:   26.8 mmHg MV Decel Time: 343 msec    TR Vmax:        259.00 cm/s MV E velocity: 72.80 cm/s MV A velocity: 91.70 cm/s  SHUNTS MV E/A ratio:  0.79        Systemic VTI:  0.32 m                            Systemic Diam: 2.00 cm Kardie Tobb DO Electronically signed by Thomasene Ripple DO Signature Date/Time: 07/21/2021/3:04:58 PM    Final       Subjective: Non-productive cough. Improvement of breathing. No dyspnea on exertion. No chest  pain.  Discharge Exam: Vitals:   07/21/21 1400 07/21/21 1442  BP:    Pulse:    Resp:    Temp:    SpO2: 94% 92%   Vitals:   07/21/21 1300 07/21/21 1335 07/21/21 1400 07/21/21 1442  BP:  122/76    Pulse:  77    Resp:  18    Temp:  98.5 F (36.9 C)    TempSrc:  Oral    SpO2: 91% 93% 94% 92%  Weight:      Height:        General: Pt is alert, awake, not in acute distress Cardiovascular: RRR, S1/S2 +, no rubs, no gallops Respiratory: Diminished breath sounds bilaterally with wheezing Abdominal: Soft, NT, ND, bowel sounds + Extremities: no edema, no cyanosis    The results of significant diagnostics from this hospitalization (including imaging, microbiology, ancillary and laboratory) are listed below for reference.     Microbiology: Recent Results (from the past 240 hour(s))  Culture, blood (routine x 2)     Status: None (Preliminary result)   Collection Time: 07/20/21  6:03 PM   Specimen: BLOOD  Result Value Ref Range Status   Specimen Description   Final    BLOOD LEFT ANTECUBITAL Performed at [facility], [address] [place], [place] [address]    Special Requests   Final    BOTTLES DRAWN AEROBIC AND ANAEROBIC Blood Culture adequate volume Performed at [facility], [address] [place], [place] [address]    Culture   Final    NO GROWTH < 12 HOURS Performed at [facility], [address] [place], [place] [address]    Report Status PENDING  Incomplete  Culture, blood (routine x 2)     Status: None (Preliminary result)   Collection Time: 07/20/21  6:03 PM   Specimen: BLOOD LEFT HAND  Result Value Ref Range Status   Specimen Description   Final    BLOOD LEFT HAND Performed at [facility], [address], [place], [place] [address]    Special Requests   Final    BOTTLES DRAWN AEROBIC AND ANAEROBIC Blood Culture results may not be optimal due to an inadequate volume of blood received in  culture bottles Performed at [facility], [address] [place], [place] [address]    Culture   Final    NO GROWTH < 12 HOURS Performed at [facility], [address], [place], [place] [address]    Report Status PENDING  Incomplete  Resp Panel by RT-PCR (Flu A&B, Covid) Peripheral     Status: None   Collection Time: 07/20/21  6:04 PM   Specimen: Peripheral; Nasopharyngeal(NP) swabs in vial transport medium  Result Value Ref Range Status   SARS Coronavirus 2 by RT PCR NEGATIVE NEGATIVE Final    Comment: (NOTE) SARS-CoV-2 target nucleic acids are NOT DETECTED.  The SARS-CoV-2 RNA is generally detectable in upper respiratory specimens during the acute phase of infection. The lowest concentration of SARS-CoV-2 viral copies this assay can detect is 138 copies/mL. A negative result does not preclude SARS-Cov-2 infection and should not be used as the sole basis for treatment or other patient management decisions. A negative result may occur with  improper specimen collection/handling, submission of specimen other than nasopharyngeal swab, presence of viral mutation(s) within the areas targeted by this assay, and inadequate number of viral copies(<138 copies/mL). A negative result must be combined with clinical observations, patient history, and epidemiological information. The expected result is Negative.  Fact Sheet for Patients:  BloggerCourse.com  Fact Sheet for Healthcare Providers:  SeriousBroker.it  This test is no t yet approved or cleared by the [place] FDA and  has been authorized for detection and/or diagnosis of SARS-CoV-2 by FDA under an Emergency Use Authorization (EUA). This EUA will remain  in effect (meaning this test can be used) for the duration of the COVID-19 declaration under Section 564(b)(1) of the Act, 21 U.S.C.section 360bbb-3(b)(1), unless the authorization is terminated  or  revoked sooner.       Influenza A by PCR NEGATIVE NEGATIVE Final   Influenza B by PCR NEGATIVE NEGATIVE Final    Comment: (NOTE) The Xpert Xpress SARS-CoV-2/FLU/RSV plus assay is intended as an aid in the diagnosis of influenza from Nasopharyngeal swab specimens and should not be used as a sole basis for treatment. Nasal washings and aspirates are unacceptable for Xpert Xpress SARS-CoV-2/FLU/RSV testing.  Fact Sheet for Patients: BloggerCourse.com  Fact Sheet for Healthcare Providers: SeriousBroker.it  This test is not yet approved or cleared by the [place] and  has been authorized for detection and/or diagnosis of SARS-CoV-2 by FDA under an Emergency Use Authorization (EUA). This EUA will remain in effect (meaning this test can be used) for the duration of the COVID-19 declaration under Section 564(b)(1) of the Act, 21 U.S.C. section 360bbb-3(b)(1), unless the authorization is terminated or revoked.  Performed at [facility], [address] [place], [place] [address]      Labs: BNP (last 3 results) Recent Labs    07/20/21 1803  BNP 31.2   Basic Metabolic Panel: Recent Labs  Lab 07/20/21 1803 07/20/21 2307 07/21/21 0554  NA 137  --  139  K 2.9*  --  3.7  CL 99  --  100  CO2 32  --  31  GLUCOSE 96  --  143*  BUN 19  --  22  CREATININE 1.01*  --  0.89  CALCIUM 8.7*  --  8.8*  MG  --  2.3  --    Liver Function Tests: Recent Labs  Lab 07/21/21 0554  AST 18  ALT 12  ALKPHOS 64  BILITOT 0.5  PROT 6.8  ALBUMIN 3.6   No results for input(s): LIPASE, AMYLASE in the last 168 hours. No results for input(s): AMMONIA in the last 168 hours. CBC: Recent Labs  Lab 07/20/21 1803  WBC 6.5  NEUTROABS 2.7  HGB 13.7  HCT 43.2  MCV 91.7  PLT 269   Cardiac Enzymes: No results for input(s): CKTOTAL, CKMB, CKMBINDEX, TROPONINI in the last 168 hours. BNP: Invalid input(s):  POCBNP CBG: No results for input(s): GLUCAP in the last 168 hours. D-Dimer No results for input(s): DDIMER in the last 72 hours. Hgb A1c No results for input(s): HGBA1C in the last 72 hours. Lipid Profile No results for input(s): CHOL, HDL, LDLCALC, TRIG, CHOLHDL, LDLDIRECT in the last 72 hours. Thyroid function studies No results for input(s): TSH, T4TOTAL, T3FREE, THYROIDAB in the last 72 hours.  Invalid input(s): FREET3 Anemia work up No results for input(s): VITAMINB12, FOLATE, FERRITIN, TIBC, IRON, RETICCTPCT in the last 72 hours. Urinalysis    Component Value Date/Time   COLORURINE YELLOW 07/20/2021 2253   APPEARANCEUR HAZY (A) 07/20/2021 2253   LABSPEC 1.019 07/20/2021 2253   PHURINE 5.0 07/20/2021 2253   GLUCOSEU NEGATIVE 07/20/2021 2253   HGBUR NEGATIVE 07/20/2021 2253   HGBUR negative 03/25/2009 0943   BILIRUBINUR NEGATIVE 07/20/2021 2253   KETONESUR NEGATIVE 07/20/2021 2253   PROTEINUR NEGATIVE 07/20/2021 2253   UROBILINOGEN 0.2 03/09/2014 2122   NITRITE NEGATIVE 07/20/2021 2253   LEUKOCYTESUR NEGATIVE 07/20/2021 2253   Sepsis Labs Invalid input(s): PROCALCITONIN,  WBC,  LACTICIDVEN Microbiology Recent Results (from the past 240 hour(s))  Culture, blood (routine x 2)     Status: None (Preliminary result)   Collection Time: 07/20/21  6:03 PM   Specimen: BLOOD  Result Value Ref Range Status   Specimen Description   Final    BLOOD LEFT ANTECUBITAL Performed at [facility], [address], [place], [place] [address]    Special Requests   Final    BOTTLES DRAWN AEROBIC AND ANAEROBIC Blood Culture adequate volume Performed at [facility], [address] [place], [place] [address]    Culture   Final    NO GROWTH < 12 HOURS Performed at [facility], [address], [place], [place] [address]    Report Status PENDING  Incomplete  Culture, blood (routine x 2)     Status: None (Preliminary  result)   Collection Time:  07/20/21  6:03 PM   Specimen: BLOOD LEFT HAND  Result Value Ref Range Status   Specimen Description   Final    BLOOD LEFT HAND Performed at [facility], [address], [place], [place] [address]    Special Requests   Final    BOTTLES DRAWN AEROBIC AND ANAEROBIC Blood Culture results may not be optimal due to an inadequate volume of blood received in culture bottles Performed at [facility], [address], [place], [place] [address]    Culture   Final    NO GROWTH < 12 HOURS Performed at [facility], [address] [place], [place] [address]    Report Status PENDING  Incomplete  Resp Panel by RT-PCR (Flu A&B, Covid) Peripheral     Status: None   Collection Time: 07/20/21  6:04 PM   Specimen: Peripheral; Nasopharyngeal(NP) swabs in vial transport medium  Result Value Ref Range Status   SARS Coronavirus 2 by RT PCR NEGATIVE NEGATIVE Final    Comment: (NOTE) SARS-CoV-2 target nucleic acids are NOT DETECTED.  The SARS-CoV-2 RNA is generally detectable in upper respiratory specimens during the acute phase of infection. The lowest concentration of SARS-CoV-2 viral copies this assay can detect is 138 copies/mL. A negative result does not preclude SARS-Cov-2 infection and should not be used as the sole basis for treatment or other patient management decisions. A negative result may occur with  improper specimen collection/handling, submission of specimen other than nasopharyngeal swab, presence of viral mutation(s) within the areas targeted by this assay, and inadequate number of viral copies(<138 copies/mL). A negative result must be combined with clinical observations, patient history, and epidemiological information. The expected result is Negative.  Fact Sheet for Patients:  BloggerCourse.com  Fact Sheet for Healthcare Providers:  SeriousBroker.it  This test is no t yet approved or  cleared by the [place] FDA and  has been authorized for detection and/or diagnosis of SARS-CoV-2 by FDA under an Emergency Use Authorization (EUA). This EUA will remain  in effect (meaning this test can be used) for the duration of the COVID-19 declaration under Section 564(b)(1) of the Act, 21 U.S.C.section 360bbb-3(b)(1), unless the authorization is terminated  or revoked sooner.       Influenza A by PCR NEGATIVE NEGATIVE Final   Influenza B by PCR NEGATIVE NEGATIVE Final    Comment: (NOTE) The Xpert Xpress SARS-CoV-2/FLU/RSV plus assay is intended as an aid in the diagnosis of influenza from Nasopharyngeal swab specimens and should not be used as a sole basis for treatment. Nasal washings and aspirates are unacceptable for Xpert Xpress SARS-CoV-2/FLU/RSV testing.  Fact Sheet for Patients: BloggerCourse.com  Fact Sheet for Healthcare Providers: SeriousBroker.it  This test is not yet approved or cleared by the [place] FDA and has been authorized for detection and/or diagnosis of SARS-CoV-2 by FDA under an Emergency Use Authorization (EUA). This EUA will remain in effect (meaning this test can be used) for the duration of the COVID-19 declaration under Section 564(b)(1) of the Act, 21 U.S.C. section 360bbb-3(b)(1), unless the authorization is terminated or revoked.  Performed at [facility], [address], [place], [place] [address]      Time coordinating discharge: 35 minutes  SIGNED:   Jacquelin Hawking, MD [facility] 07/21/2021, 4:01 PM

## 2021-07-21 NOTE — Progress Notes (Signed)
TOC CSW attempted to complete a CC44.  Unfortunately, pt had left before CC44 could be completed.  Wyley Hack Tarpley-Carter, MSW, LCSW-A Pronouns:  She/Her/Hers [facility] HealthTransitions of Care Clinical Social Worker Direct Number:  [phone] [email or web address]

## 2021-07-21 NOTE — Progress Notes (Signed)
  Echocardiogram 2D Echocardiogram has been performed.  Pieter Partridge 07/21/2021, 8:37 AM

## 2021-07-21 NOTE — ED Notes (Signed)
Patient doesn't want to be here, she is adamant about going home. Told her the doctor would be around this morning and she should certainly talk to the MD about that. Patient wanted food this morning and is upset that she only received a [place] sandwich to eat last night. I asked her what time that was and she said late last night. I educated her on that the cafeteria here closes at 7 pm and doesn't open back up until this morning, which is the reason we didn't have a tray for her. I reassured her that she will receive one this morning.  Patient given ginger ale and graham crackers until her tray arrives. Patient also complaining about pain at her IV site, which does flush fine and have blood return.  I offered to remove and place a new one and she refused.

## 2021-07-21 NOTE — ED Notes (Signed)
Pt. Documented in error see above note in chart. 

## 2021-07-22 ENCOUNTER — Telehealth: Payer: Self-pay

## 2021-07-22 NOTE — Telephone Encounter (Signed)
Transition Care Management Unsuccessful Follow-up Telephone Call  Date of discharge and from where:  [name] 07/[facility]  Attempts:  1st Attempt  Reason for unsuccessful TCM follow-up call:  Left voice message unable to reach pt at this time.  Pt need to schedule HFU appt within a week.

## 2021-07-25 ENCOUNTER — Telehealth: Payer: Self-pay

## 2021-07-25 LAB — CULTURE, BLOOD (ROUTINE X 2)
Culture: NO GROWTH
Culture: NO GROWTH
Special Requests: ADEQUATE

## 2021-07-25 NOTE — Telephone Encounter (Signed)
Transition Care Management Unsuccessful Follow-up Telephone Call  Date of discharge and from where: 07/21/2021, [facility]   Attempts:  2nd Attempt  Reason for unsuccessful TCM follow-up call:  Left voice message on # [phone].   Need to discuss scheduling a hospital follow up appointment.  Gwinda Passe, NP @ [facility] is listed as patient's PCP but she has never seen the patient while practicing at [facility].

## 2021-07-26 ENCOUNTER — Telehealth: Payer: Self-pay

## 2021-07-26 NOTE — Telephone Encounter (Signed)
Transition Care Management Unsuccessful Follow-up Telephone Call  Date of discharge and from where:  07/21/2021, [facility]   Attempts:  3rd Attempt  Reason for unsuccessful TCM follow-up call:  Left voice message on # [phone]  Elwanda Brooklyn is listed as patient's PCP however, Ms Randa Evens has never seen the patient at [facility].   Letter sent to patient requesting she contact [facility] if she would like to schedule a follow up appointment.

## 2022-01-02 ENCOUNTER — Encounter (HOSPITAL_COMMUNITY): Payer: Self-pay

## 2022-01-02 ENCOUNTER — Emergency Department (HOSPITAL_COMMUNITY): Payer: Medicare Other

## 2022-01-02 ENCOUNTER — Other Ambulatory Visit: Payer: Self-pay

## 2022-01-02 ENCOUNTER — Inpatient Hospital Stay (HOSPITAL_COMMUNITY)
Admission: EM | Admit: 2022-01-02 | Discharge: 2022-01-05 | DRG: 190 | Disposition: A | Discharge status: Home / Self Care | Payer: Medicare Other | Attending: Internal Medicine | Admitting: Internal Medicine

## 2022-01-02 DIAGNOSIS — Z8249 Family history of ischemic heart disease and other diseases of the circulatory system: Secondary | ICD-10-CM

## 2022-01-02 DIAGNOSIS — Z79899 Other long term (current) drug therapy: Secondary | ICD-10-CM | POA: Diagnosis not present

## 2022-01-02 DIAGNOSIS — Z841 Family history of disorders of kidney and ureter: Secondary | ICD-10-CM | POA: Diagnosis not present

## 2022-01-02 DIAGNOSIS — J9621 Acute and chronic respiratory failure with hypoxia: Secondary | ICD-10-CM

## 2022-01-02 DIAGNOSIS — D72829 Elevated white blood cell count, unspecified: Secondary | ICD-10-CM | POA: Diagnosis present

## 2022-01-02 DIAGNOSIS — Z9981 Dependence on supplemental oxygen: Secondary | ICD-10-CM

## 2022-01-02 DIAGNOSIS — Z7982 Long term (current) use of aspirin: Secondary | ICD-10-CM | POA: Diagnosis not present

## 2022-01-02 DIAGNOSIS — E876 Hypokalemia: Secondary | ICD-10-CM | POA: Diagnosis present

## 2022-01-02 DIAGNOSIS — F17210 Nicotine dependence, cigarettes, uncomplicated: Secondary | ICD-10-CM | POA: Diagnosis present

## 2022-01-02 DIAGNOSIS — G2581 Restless legs syndrome: Secondary | ICD-10-CM | POA: Diagnosis present

## 2022-01-02 DIAGNOSIS — Z6830 Body mass index (BMI) 30.0-30.9, adult: Secondary | ICD-10-CM

## 2022-01-02 DIAGNOSIS — I272 Pulmonary hypertension, unspecified: Secondary | ICD-10-CM | POA: Diagnosis present

## 2022-01-02 DIAGNOSIS — E669 Obesity, unspecified: Secondary | ICD-10-CM | POA: Diagnosis present

## 2022-01-02 DIAGNOSIS — Z20822 Contact with and (suspected) exposure to covid-19: Secondary | ICD-10-CM | POA: Diagnosis present

## 2022-01-02 DIAGNOSIS — J441 Chronic obstructive pulmonary disease with (acute) exacerbation: Principal | ICD-10-CM | POA: Diagnosis present

## 2022-01-02 DIAGNOSIS — G4734 Idiopathic sleep related nonobstructive alveolar hypoventilation: Secondary | ICD-10-CM | POA: Diagnosis present

## 2022-01-02 DIAGNOSIS — I10 Essential (primary) hypertension: Secondary | ICD-10-CM | POA: Diagnosis present

## 2022-01-02 LAB — RESP PANEL BY RT-PCR (FLU A&B, COVID) ARPGX2
Influenza A by PCR: NEGATIVE
Influenza B by PCR: NEGATIVE
SARS Coronavirus 2 by RT PCR: NEGATIVE

## 2022-01-02 LAB — BASIC METABOLIC PANEL
Anion gap: 8 (ref 5–15)
BUN: 17 mg/dL (ref 8–23)
CO2: 38 mmol/L — ABNORMAL HIGH (ref 22–32)
Calcium: 9.2 mg/dL (ref 8.9–10.3)
Chloride: 96 mmol/L — ABNORMAL LOW (ref 98–111)
Creatinine, Ser: 0.9 mg/dL (ref 0.44–1.00)
GFR, Estimated: >60 mL/min (ref >60)
Glucose, Bld: 96 mg/dL (ref 70–99)
Potassium: 3.3 mmol/L — ABNORMAL LOW (ref 3.5–5.1)
Sodium: 142 mmol/L (ref 135–145)

## 2022-01-02 LAB — CBC WITH DIFFERENTIAL/PLATELET
Abs Immature Granulocytes: 0.03 10*3/uL (ref 0.00–0.07)
Basophils Absolute: 0 10*3/uL (ref 0.0–0.1)
Basophils Relative: 0 %
Eosinophils Absolute: 0.2 10*3/uL (ref 0.0–0.5)
Eosinophils Relative: 2 %
HCT: 46 % (ref 36.0–46.0)
Hemoglobin: 14.2 g/dL (ref 12.0–15.0)
Immature Granulocytes: 0 %
Lymphocytes Relative: 32 %
Lymphs Abs: 3.5 10*3/uL (ref 0.7–4.0)
MCH: 28.6 pg (ref 26.0–34.0)
MCHC: 30.9 g/dL (ref 30.0–36.0)
MCV: 92.6 fL (ref 80.0–100.0)
Monocytes Absolute: 0.8 10*3/uL (ref 0.1–1.0)
Monocytes Relative: 8 %
Neutro Abs: 6.5 10*3/uL (ref 1.7–7.7)
Neutrophils Relative %: 58 %
Platelets: 313 10*3/uL (ref 150–400)
RBC: 4.97 MIL/uL (ref 3.87–5.11)
RDW: 13.5 % (ref 11.5–15.5)
WBC: 11.2 10*3/uL — ABNORMAL HIGH (ref 4.0–10.5)
nRBC: 0 % (ref 0.0–0.2)

## 2022-01-02 LAB — BRAIN NATRIURETIC PEPTIDE: B Natriuretic Peptide: 38.4 pg/mL (ref 0.0–100.0)

## 2022-01-02 MED ORDER — SODIUM CHLORIDE 0.9 % IV SOLN
1.0000 g | INTRAVENOUS | Status: DC
  Administered 2022-01-02 – 2022-01-04 (×3): 1 g via INTRAVENOUS
  Filled 2022-01-02 (×3): qty 10

## 2022-01-02 MED ORDER — POTASSIUM CHLORIDE CRYS ER 20 MEQ PO TBCR
40.0000 meq | EXTENDED_RELEASE_TABLET | Freq: Two times a day (BID) | ORAL | Status: AC
  Administered 2022-01-02 – 2022-01-03 (×2): 40 meq via ORAL
  Filled 2022-01-02 (×2): qty 2

## 2022-01-02 MED ORDER — IPRATROPIUM BROMIDE 0.02 % IN SOLN
0.5000 mg | Freq: Once | RESPIRATORY_TRACT | Status: AC
  Administered 2022-01-02: 0.5 mg via RESPIRATORY_TRACT
  Filled 2022-01-02: qty 2.5

## 2022-01-02 MED ORDER — IPRATROPIUM-ALBUTEROL 0.5-2.5 (3) MG/3ML IN SOLN
3.0000 mL | RESPIRATORY_TRACT | Status: DC | PRN
  Administered 2022-01-03: 3 mL via RESPIRATORY_TRACT
  Filled 2022-01-02: qty 3

## 2022-01-02 MED ORDER — ALBUTEROL SULFATE (2.5 MG/3ML) 0.083% IN NEBU
5.0000 mg | INHALATION_SOLUTION | Freq: Once | RESPIRATORY_TRACT | Status: AC
Start: 2022-01-02 — End: 2022-01-02
  Administered 2022-01-02: 5 mg via RESPIRATORY_TRACT
  Filled 2022-01-02: qty 6

## 2022-01-02 MED ORDER — ENOXAPARIN SODIUM 40 MG/0.4ML IJ SOSY
40.0000 mg | PREFILLED_SYRINGE | Freq: Every day | INTRAMUSCULAR | Status: DC
  Administered 2022-01-02 – 2022-01-04 (×3): 40 mg via SUBCUTANEOUS
  Filled 2022-01-02 (×3): qty 0.4

## 2022-01-02 MED ORDER — METHYLPREDNISOLONE SODIUM SUCC 125 MG IJ SOLR
125.0000 mg | Freq: Once | INTRAMUSCULAR | Status: AC
  Administered 2022-01-02: 125 mg via INTRAVENOUS
  Filled 2022-01-02: qty 2

## 2022-01-02 MED ORDER — METHYLPREDNISOLONE SODIUM SUCC 125 MG IJ SOLR
60.0000 mg | Freq: Every day | INTRAMUSCULAR | Status: DC
Start: 2022-01-03 — End: 2022-01-05
  Administered 2022-01-03 – 2022-01-05 (×3): 60 mg via INTRAVENOUS
  Filled 2022-01-02 (×3): qty 2

## 2022-01-02 MED ORDER — SODIUM CHLORIDE 0.9 % IV BOLUS
500.0000 mL | Freq: Once | INTRAVENOUS | Status: AC
  Administered 2022-01-02: 500 mL via INTRAVENOUS

## 2022-01-02 NOTE — Assessment & Plan Note (Addendum)
Resume home antihypertensives once med rec is complete ?

## 2022-01-02 NOTE — ED Notes (Signed)
Pt ambulated to the bathroom on 6L of oxygen. Pt's oxygen saturation dropped to 79% and her heart rate increased to 120. Pt is now stable with her vitals in normal limits, resting in her bed. MD made aware. ?

## 2022-01-02 NOTE — ED Triage Notes (Signed)
Patient reports that she had a respiratory infection 4 days ago and was prescribed Prednisone. Patient states she had increased SOB and was on her way to the physician's office and forgot her Oxygen. Sats were 78% on room air when the patient arrived to the ED. Patient is normally on O2 3L/min via [place] at home. Sats  normally 95% on 3L/min via [place] ? ? ?Patient has a productive cough with yellow sputum. ?

## 2022-01-02 NOTE — Assessment & Plan Note (Signed)
Replete with oral potassium 

## 2022-01-02 NOTE — ED Notes (Signed)
EKG given to Dr Estell Harpin by Delorise Shiner, NT ?

## 2022-01-02 NOTE — Assessment & Plan Note (Signed)
Secondary to COPD exacerbation ?Has chronic nocturnal hypoxemia on 2 L and requiring up to 8 L via nasal cannula on admission.  Maintain O2 of 88 to 92% and wean as tolerated back to baseline. ?-Continue DuoNeb PRN q4hr ?-IV 60 mg Solu-Medrol daily ?-Start IV Rocephin ?

## 2022-01-02 NOTE — ED Provider Triage Note (Signed)
Emergency Medicine Provider Triage Evaluation Note ? ?[name] , a 66 y.o. female  was evaluated in triage.  Pt complains of shortness of breath.  Patient states she was placed on prednisone on Friday due to COPD exacerbation.  Patient reports since this time she had increased shortness of breath.  Patient denies chest pain, fevers, body aches or chills.  Patient usually wears 3 L of oxygen at home however she is currently satting 91% on the 3 L.  Her oxygen has been increased. ? ?Review of Systems  ?Positive:  ?Negative:  ? ?Physical Exam  ?BP 130/69 (BP Location: Left Arm)   Pulse 95   Temp 98 ?F (36.7 ?C) (Oral)   Resp 17   Ht 5\' 5"  (1.651 m)   Wt 83 kg   SpO2 93%   BMI 30.45 kg/m?  ?Gen:   Awake, no distress   ?Resp:  Normal effort  ?MSK:   Moves extremities without difficulty  ?Other:   ? ?Medical Decision Making  ?Medically screening exam initiated at 2:52 PM.  Appropriate orders placed.  Arah Aro was informed that the remainder of the evaluation will be completed by another provider, this initial triage assessment does not replace that evaluation, and the importance of remaining in the ED until their evaluation is complete. ? ? ?  ?Laureen Ochs, PA-C ?01/02/22 1453 ? ?

## 2022-01-02 NOTE — H&P (Signed)
?History and Physical  ? ? ?Patient: [name] [id number] DOB: 23-May-1956 ?DOA: 01/02/2022 ?DOS: the patient was seen and examined on 01/02/2022 ?PCP: Kerin Perna, NP  ?Patient coming from: Home ? ?Chief Complaint:  ?Chief Complaint  ?Patient presents with  ? Shortness of Breath  ? Cough  ? ?HPI: [name] is a 66 y.o. female with medical history significant of COPD/asthma with nocturnal hypoxia 2L , graves disease s/p iodine ablation, HTN, RLS who presents with increasing shortness of breath. ? ?Symptoms started about a week ago with increase cough productive of sputum. Had intermittent O2 down in the 70% that became more persistent today. Denies any chest pain. No fever or chills. No nausea, vomiting or diarrhea. Continues to smoke when stressed and had about 5 cigarettes this week.  ? ?In the ED, she was afebrile normotensive but hypoxic down to 78% and put on 8 L via nasal cannula.  Mild leukocytosis of 11.2, no anemia, sodium 142, K of 3.3, chloride of 96, CO2 of 38, creatinine of 0.90. ? ?Negative flu and COVID PCR. ? ?Chest x-ray with upper pulmonary vascular prominence compatible with pulmonary venous hypertension.  Airway thickening suggesting bronchitis or reactive airway disease.  Subsegmental atelectasis or scarring in the left midlung. ? ?She was given albuterol nebulizer and Solu-Medrol in the ED.  Hospitalist then called for further management of COPD exacerbation. ?Review of Systems: As mentioned in the history of present illness. All other systems reviewed and are negative. ?Past Medical History:  ?Diagnosis Date  ? COPD with asthma ([place])   ? currently followed by pcp (previous as seen pulmonogist --- dr byrum lov note in epic 10-04-2017),  last exacerbation with pneumonia 04/ 2022 pt stated completed antibiotic , no residual)  ? DOE (dyspnea on exertion)   ? 02-15-2021  per pt only with hills/ stairs , no issue with other activities  ? History of hyperthyroidism   ? s/p  RAI 12/  2013,  followed by pcp  ? Hypertension   ? Nocturnal hypoxemia   ? uses 2L oxygen at night only,  (02-15-2021 pt stated O2 sat can get down 70--88%,  stated during the day O2 sat on RA are normal)  ? Nocturnal leg cramps   ? occasional  ? RLS (restless legs syndrome)   ? Wears dentures   ? upper  ? ?Past Surgical History:  ?Procedure Laterality Date  ? ABDOMINAL HYSTERECTOMY  1989  ? WITH UNILATERAL SALPINGOOPHORECTOMY AND APPENDECTOMY  ? ADJUSTABLE SUTURE MANIPULATION Right 02/16/2021  ? Procedure: RIGHT MEDIAL RECTUS ADJUSTABLE SUTURE MANIPULATION;  Surgeon: Gevena Cotton, MD;  [facility];  Service: Ophthalmology;  Laterality: Right;  ? MEDIAN RECTUS REPAIR Right 02/16/2021  ? Procedure: RIGHT LATERAL RECTUS RECESSION;  Surgeon: Gevena Cotton, MD;  Location: [facility];  Service: Ophthalmology;  Laterality: Right;  ? MUSCLE RECESSION AND RESECTION Right 02/16/2021  ? Procedure: RIGHT MEDIAL RECTUS RESECTION;  Surgeon: Gevena Cotton, MD;  Location[facility];  Service: Ophthalmology;  Laterality: Right;  ? STRABISMUS SURGERY Left 02/16/2021  ? Procedure: LEFT INFERIOR OBLIQUE RESECTION;  Surgeon: Gevena Cotton, MD;  Location: [facility];  Service: Ophthalmology;  Laterality: Left;  ? ?Social History:  reports that she has been smoking cigarettes. She has never used smokeless tobacco. She reports that she does not drink alcohol and does not use drugs. ? ?No Known Allergies ? ?Family History  ?Problem Relation Age of Onset  ? Kidney failure Mother   ?  Heart failure Mother   ? Heart attack Father   ? Heart attack Brother   ? ? ?Prior to Admission medications   ?Medication Sig Start Date End Date Taking? Authorizing Provider  ?albuterol (PROVENTIL HFA;VENTOLIN HFA) 108 (90 BASE) MCG/ACT inhaler Inhale 2 puffs into the lungs every 6 (six) hours as needed for wheezing or shortness of breath.    [provider]  ?albuterol (PROVENTIL)  (5 MG/ML) 0.5% nebulizer solution Take 0.5 mLs (2.5 mg total) by nebulization every 4 (four) hours while awake for 3 days, THEN 0.5 mLs (2.5 mg total) every 4 (four) hours as needed for wheezing or shortness of breath. 07/21/21 08/23/21  Mariel Aloe, MD  ?aspirin 81 MG chewable tablet Chew 81 mg by mouth daily.    [provider]  ?budesonide-formoterol (SYMBICORT) 160-4.5 MCG/ACT inhaler Inhale 2 puffs into the lungs 2 (two) times daily. ?Patient not taking: Reported on 07/20/2021 11/25/18   Flora Lipps, MD  ?chlorpheniramine-HYDROcodone (TUSSIONEX) 10-8 MG/5ML SUER Take 2.5 mLs by mouth daily as needed for cough. 03/29/21   [provider]  ?Cholecalciferol (VITAMIN D) 50 MCG (2000 UT) tablet Take 2,000 Units by mouth daily.    [provider]  ?[employer] MUCUS EXTENDED RELEASE 600 MG 12 hr tablet Take 600 mg by mouth daily as needed for cough. 03/29/21   [provider]  ?escitalopram (LEXAPRO) 10 MG tablet Take 10 mg by mouth at bedtime. 09/18/18   [provider]  ?fluticasone (FLONASE) 50 MCG/ACT nasal spray Place 1 spray into both nostrils daily as needed for allergies or rhinitis.    [provider]  ?hydrochlorothiazide (HYDRODIURIL) 25 MG tablet Take 25 mg by mouth daily. 09/03/15   [provider]  ?ibuprofen (ADVIL) 800 MG tablet Take 800 mg by mouth every 8 (eight) hours as needed for moderate pain. 06/27/21   [provider]  ?Multiple Vitamin (MULTIVITAMIN WITH MINERALS) TABS tablet Take 1 tablet by mouth daily.    [provider]  ?OXYGEN Inhale 2 L into the lungs at bedtime. Per pt prescribed as night time only, does not use as supplemental during the day    [provider]  ?Probiotic Product (PROBIOTIC DAILY) CAPS Take 1 capsule by mouth daily.    [provider]  ?traZODone (DESYREL) 50 MG tablet Take 50 mg by mouth at bedtime. 02/07/21   [provider]  ? ? ?Physical Exam: ?Vitals:  ? 01/02/22  1823 01/02/22 1830 01/02/22 1938 01/02/22 2041  ?BP:  125/71  (!) 122/59  ?Pulse:  81  83  ?Resp:  18  18  ?Temp:   98.8 ?F (37.1 ?C) (!) 100.7 ?F (38.2 ?C)  ?TempSrc:   Oral Oral  ?SpO2: 90% 92%  95%  ?Weight:      ?Height:      ? ?Constitutional: NAD, calm, comfortable, well-appearing elderly female laying approximately 30 degree incline in bed ?Eyes: lids and conjunctivae normal ?ENMT: Mucous membranes are moist.  ?Neck: normal, supple ?Respiratory: Diminished breath sounds throughout but no wheezing, no crackles.  Significant increased work of breathing when trying to sit up in bed but able to speak in full sentences on 8 L via nasal cannula.  No accessory muscle use.  ?Cardiovascular: Regular rate and rhythm, no murmurs / rubs / gallops. No extremity edema.  ?Abdomen: no tenderness, Bowel sounds positive.  ?Musculoskeletal: no clubbing / cyanosis. No joint deformity upper and lower extremities. Good ROM, no contractures. Normal muscle tone.  ?Skin: no  rashes, lesions, ulcers.  ?Neurologic: CN 2-12 grossly intact. Strength 5/5 in all 4.  ?Psychiatric: Normal judgment and insight. Alert and oriented x 3. Normal mood. ?Data Reviewed: ? ?See HPI ? ?Assessment and Plan: ?Acute on chronic respiratory failure with hypoxia ([place]) ?Secondary to COPD exacerbation ?Has chronic nocturnal hypoxemia on 2 L and requiring up to 8 L via nasal cannula on admission.  Maintain O2 of 88 to 92% and wean as tolerated back to baseline. ?-Continue DuoNeb PRN q4hr ?-IV 60 mg Solu-Medrol daily ?-Start IV Rocephin ? ?Hypokalemia ?Replete with oral potassium ? ?HYPERTENSION, BENIGN ESSENTIAL ?Resume home antihypertensives once med rec is complete ? ? ? ? ? Advance Care Planning:   Code Status: Full Code  ? ?Consults: none ? ?Family Communication: No family at bedside ? ?Severity of Illness: ?The appropriate patient status for this patient is INPATIENT. Inpatient status is judged to be reasonable and necessary in order to provide the  required intensity of service to ensure the patient's safety. The patient's presenting symptoms, physical exam findings, and initial radiographic and laboratory data in the context of their chronic comorbidi

## 2022-01-02 NOTE — ED Provider Notes (Signed)
?[facility]-EMERGENCY DEPT ?Provider Note ? ? ?CSN: [id number] ?Arrival date & time: 01/02/22  1437 ? ?  ? ?History ? ?Chief Complaint  ?Patient presents with  ? Shortness of Breath  ? Cough  ? ? ?[name] is a 66 y.o. female. ? ? ?Shortness of Breath ?Associated symptoms: cough   ?Cough ?Associated symptoms: shortness of breath   ?Patient presents with shortness of breath and cough.  History of COPD.  On home oxygen.  Has been on around-the-clock but has been changed to only at night.  On 3 L.  Had sats in the 70s at home on her oxygen.  78% upon arrival but did not treat with her.  Improved now on nasal cannula.  Has had a cough with green sputum production.  Has had about 3 to 4 days of steroids.  States she always gets worse with the weather.  No fevers or chills.  No real chest pain. ?  ? ?Home Medications ?Prior to Admission medications   ?Medication Sig Start Date End Date Taking? Authorizing Provider  ?albuterol (PROVENTIL HFA;VENTOLIN HFA) 108 (90 BASE) MCG/ACT inhaler Inhale 2 puffs into the lungs every 6 (six) hours as needed for wheezing or shortness of breath.    [provider]  ?albuterol (PROVENTIL) (5 MG/ML) 0.5% nebulizer solution Take 0.5 mLs (2.5 mg total) by nebulization every 4 (four) hours while awake for 3 days, THEN 0.5 mLs (2.5 mg total) every 4 (four) hours as needed for wheezing or shortness of breath. 07/21/21 08/23/21  Narda BondsNettey, Ralph A, MD  ?aspirin 81 MG chewable tablet Chew 81 mg by mouth daily.    [provider]  ?budesonide-formoterol (SYMBICORT) 160-4.5 MCG/ACT inhaler Inhale 2 puffs into the lungs 2 (two) times daily. ?Patient not taking: Reported on 07/20/2021 11/25/18   Joycelyn DasPokhrel, [place], MD  ?chlorpheniramine-HYDROcodone (TUSSIONEX) 10-8 MG/5ML SUER Take 2.5 mLs by mouth daily as needed for cough. 03/29/21   [provider]  ?Cholecalciferol (VITAMIN D) 50 MCG (2000 UT) tablet Take 2,000 Units by mouth daily.    [provider]  ?CVS MUCUS EXTENDED RELEASE 600 MG 12 hr tablet Take 600 mg by mouth daily as needed for cough. 03/29/21   [provider]  ?escitalopram (LEXAPRO) 10 MG tablet Take 10 mg by mouth at bedtime. 09/18/18   [provider]  ?fluticasone (FLONASE) 50 MCG/ACT nasal spray Place 1 spray into both nostrils daily as needed for allergies or rhinitis.    [provider]  ?hydrochlorothiazide (HYDRODIURIL) 25 MG tablet Take 25 mg by mouth daily. 09/03/15   [provider]  ?ibuprofen (ADVIL) 800 MG tablet Take 800 mg by mouth every 8 (eight) hours as needed for moderate pain. 06/27/21   [provider]  ?Multiple Vitamin (MULTIVITAMIN WITH MINERALS) TABS tablet Take 1 tablet by mouth daily.    [provider]  ?OXYGEN Inhale 2 L into the lungs at bedtime. Per pt prescribed as night time only, does not use as supplemental during the day    [provider]  ?Probiotic Product (PROBIOTIC DAILY) CAPS Take 1 capsule by mouth daily.    [provider]  ?traZODone (DESYREL) 50 MG tablet Take 50 mg by mouth at bedtime. 02/07/21   [provider]  ?   ? ?Allergies    ?Patient has no known allergies.   ? ?Review of Systems   ?Review of Systems  ?Constitutional:  Positive for appetite change and fatigue.  ?HENT:  Positive for  congestion.   ?Respiratory:  Positive for cough and shortness of breath.   ?Cardiovascular:  Negative for leg swelling.  ?Neurological:  Negative for weakness.  ? ?Physical Exam ?Updated Vital Signs ?BP (!) 102/57   Pulse 83   Temp 98 ?F (36.7 ?C) (Oral)   Resp 20   Ht 5\' 5"  (1.651 m)   Wt 83 kg   SpO2 90%   BMI 30.45 kg/m?  ?Physical Exam ?Vitals and nursing note reviewed.  ?HENT:  ?   Head: Normocephalic.  ?Cardiovascular:  ?   Rate and Rhythm: Normal rate and regular rhythm.  ?Pulmonary:  ?   Comments: Frequent coughing.  Somewhat harsh breath sounds. ?Chest:  ?   Chest wall: No tenderness.  ?Musculoskeletal:  ?    Right lower leg: No edema.  ?   Left lower leg: No edema.  ?Skin: ?   Capillary Refill: Capillary refill takes less than 2 seconds.  ?Neurological:  ?   Mental Status: She is alert and oriented to person, place, and time.  ? ? ?ED Results / Procedures / Treatments   ?Labs ?(all labs ordered are listed, but only abnormal results are displayed) ?Labs Reviewed  ?BASIC METABOLIC PANEL - Abnormal; Notable for the following components:  ?    Result Value  ? Potassium 3.3 (*)   ? Chloride 96 (*)   ? CO2 38 (*)   ? All other components within normal limits  ?CBC WITH DIFFERENTIAL/PLATELET - Abnormal; Notable for the following components:  ? WBC 11.2 (*)   ? All other components within normal limits  ?RESP PANEL BY RT-PCR (FLU A&B, COVID) [id number]  ?BRAIN NATRIURETIC PEPTIDE  ? ? ?EKG ?None ? ?Radiology ?DG Chest 2 View ? ?Result Date: 01/02/2022 ?CLINICAL DATA:  Shortness of breath. Respiratory infection 4 days ago. Hypoxia. EXAM: CHEST - 2 VIEW COMPARISON:  07/20/2021 FINDINGS: Atherosclerotic calcification of the aortic arch. Mild enlargement of the cardiopericardial silhouette. Upper zone vascular prominence favoring pulmonary venous hypertension. Mild airway thickening. Linear subsegmental atelectasis or scarring in the left mid lung poorly corroborated on the lateral projection but more likely in the left lower lobe. No blunting of the costophrenic angles or other airspace opacity identified. Thoracic spondylosis. IMPRESSION: 1. Mild enlargement of the cardiopericardial silhouette with upper zone pulmonary vascular prominence compatible with pulmonary venous hypertension. 2. Airway thickening is present, suggesting bronchitis or reactive airways disease. 3. Subsegmental atelectasis or scarring in the left mid lung. 4. Thoracic spondylosis. 5.  Aortic Atherosclerosis (ICD10-I70.0). Electronically Signed   By: 13/05/2021 M.D.   On: 01/02/2022 15:57   ? ?Procedures ?Procedures  ? ? ?Medications Ordered in  ED ?Medications  ?methylPREDNISolone sodium succinate (SOLU-MEDROL) 125 mg/2 mL injection 125 mg (has no administration in time range)  ?albuterol (PROVENTIL) (2.5 MG/3ML) 0.083% nebulizer solution 5 mg (5 mg Nebulization Given 01/02/22 1642)  ?ipratropium (ATROVENT) nebulizer solution 0.5 mg (0.5 mg Nebulization Given 01/02/22 1642)  ? ? ?ED Course/ Medical Decision Making/ A&P ?  ?                        ?Medical Decision Making ?Amount and/or Complexity of Data Reviewed ?Labs: ordered. ? ?Risk ?Prescription drug management. ? ? ?Patient presents with shortness of breath.  Has had for the last few days.  History of COPD.  Has been on steroids much relief.  Found to be increasingly hypoxic.  Sats in the 70s at home on her oxygen at 3  L.  Chest x-ray here reassuring.  No pneumonia.  However continues to have episodes of hypoxia.  Better on nasal cannula at 6 L now but with ambulation dropped down lower.Went down to 79% but then went back up to the 90s.  Had tachycardia that is also improved.  Doubt pulmonary embolism.  With this increasing hypoxia will require admission to the hospital.  Will discuss with hospitalist. ? ? ? ? ? ? ? ?Final Clinical Impression(s) / ED Diagnoses ?Final diagnoses:  ?COPD exacerbation ([facility])  ? ? ?Rx / DC Orders ?ED Discharge Orders   ? ? None  ? ?  ? ? ?  ?Benjiman Core, MD ?01/02/22 1853 ? ?

## 2022-01-03 DIAGNOSIS — J441 Chronic obstructive pulmonary disease with (acute) exacerbation: Secondary | ICD-10-CM | POA: Diagnosis not present

## 2022-01-03 LAB — CBC
HCT: 44.5 % (ref 36.0–46.0)
Hemoglobin: 14 g/dL (ref 12.0–15.0)
MCH: 29.3 pg (ref 26.0–34.0)
MCHC: 31.5 g/dL (ref 30.0–36.0)
MCV: 93.1 fL (ref 80.0–100.0)
Platelets: 317 10*3/uL (ref 150–400)
RBC: 4.78 MIL/uL (ref 3.87–5.11)
RDW: 13.2 % (ref 11.5–15.5)
WBC: 10.4 10*3/uL (ref 4.0–10.5)
nRBC: 0 % (ref 0.0–0.2)

## 2022-01-03 LAB — BASIC METABOLIC PANEL
Anion gap: 9 (ref 5–15)
BUN: 15 mg/dL (ref 8–23)
CO2: 32 mmol/L (ref 22–32)
Calcium: 8.6 mg/dL — ABNORMAL LOW (ref 8.9–10.3)
Chloride: 97 mmol/L — ABNORMAL LOW (ref 98–111)
Creatinine, Ser: 0.85 mg/dL (ref 0.44–1.00)
GFR, Estimated: >60 mL/min (ref >60)
Glucose, Bld: 153 mg/dL — ABNORMAL HIGH (ref 70–99)
Potassium: 3.6 mmol/L (ref 3.5–5.1)
Sodium: 138 mmol/L (ref 135–145)

## 2022-01-03 MED ORDER — MOMETASONE FURO-FORMOTEROL FUM 200-5 MCG/ACT IN AERO
2.0000 | INHALATION_SPRAY | Freq: Two times a day (BID) | RESPIRATORY_TRACT | Status: DC
  Administered 2022-01-03 – 2022-01-05 (×5): 2 via RESPIRATORY_TRACT
  Filled 2022-01-03: qty 8.8

## 2022-01-03 MED ORDER — LIDOCAINE 5 % EX PTCH
1.0000 | MEDICATED_PATCH | CUTANEOUS | Status: AC
  Administered 2022-01-03: 1 via TRANSDERMAL
  Filled 2022-01-03: qty 1

## 2022-01-03 MED ORDER — TRAZODONE HCL 50 MG PO TABS
50.0000 mg | ORAL_TABLET | Freq: Every day | ORAL | Status: DC
  Administered 2022-01-03 – 2022-01-04 (×2): 50 mg via ORAL
  Filled 2022-01-03 (×2): qty 1

## 2022-01-03 MED ORDER — HYDROCHLOROTHIAZIDE 25 MG PO TABS
25.0000 mg | ORAL_TABLET | Freq: Every morning | ORAL | Status: DC
  Administered 2022-01-03 – 2022-01-05 (×3): 25 mg via ORAL
  Filled 2022-01-03 (×3): qty 1

## 2022-01-03 MED ORDER — FLUTICASONE PROPIONATE 50 MCG/ACT NA SUSP: 1.0000 | Freq: Every day | NASAL | Status: DC | PRN

## 2022-01-03 MED ORDER — IPRATROPIUM-ALBUTEROL 0.5-2.5 (3) MG/3ML IN SOLN
3.0000 mL | Freq: Two times a day (BID) | RESPIRATORY_TRACT | Status: DC
  Administered 2022-01-03 (×2): 3 mL via RESPIRATORY_TRACT
  Filled 2022-01-03 (×2): qty 3

## 2022-01-03 MED ORDER — OYSTER SHELL CALCIUM/D3 500-5 MG-MCG PO TABS
ORAL_TABLET | Freq: Every morning | ORAL | Status: DC
Start: 2022-01-04 — End: 2022-01-04
  Administered 2022-01-03: 1 via ORAL
  Filled 2022-01-03 (×2): qty 1

## 2022-01-03 MED ORDER — IPRATROPIUM-ALBUTEROL 0.5-2.5 (3) MG/3ML IN SOLN
3.0000 mL | Freq: Four times a day (QID) | RESPIRATORY_TRACT | Status: DC
  Administered 2022-01-04 – 2022-01-05 (×6): 3 mL via RESPIRATORY_TRACT
  Filled 2022-01-03 (×6): qty 3

## 2022-01-03 MED ORDER — ASPIRIN 81 MG PO CHEW
81.0000 mg | CHEWABLE_TABLET | Freq: Every morning | ORAL | Status: DC
  Administered 2022-01-03 – 2022-01-05 (×3): 81 mg via ORAL
  Filled 2022-01-03 (×3): qty 1

## 2022-01-03 MED ORDER — ADULT MULTIVITAMIN W/MINERALS CH
1.0000 | ORAL_TABLET | Freq: Every morning | ORAL | Status: DC
  Administered 2022-01-03 – 2022-01-05 (×3): 1 via ORAL
  Filled 2022-01-03 (×3): qty 1

## 2022-01-03 MED ORDER — SACCHAROMYCES BOULARDII 250 MG PO CAPS
250.0000 mg | ORAL_CAPSULE | Freq: Every morning | ORAL | Status: DC
  Administered 2022-01-03 – 2022-01-05 (×3): 250 mg via ORAL
  Filled 2022-01-03 (×3): qty 1

## 2022-01-03 NOTE — Hospital Course (Addendum)
43 yof w/ COPD/asthma with nocturnal hypoxia 2L , graves disease s/p iodine ablation, HTN, RLS who presented with increasing shortness of breath started about a week ago with increase cough productive of sputum with intermittent O2 down in the 70% that became more persistent.   ?In the ED, she was afebrile normotensive but hypoxic down to 78% and put on 8 L via nasal cannula.  Mild leukocytosis of 11.2, no anemia, sodium 142, K of 3.3, chloride of 96, CO2 of 38, creatinine of 0.90. ?negative flu and COVID PCR. CXR- W/ upper pulmonary vascular prominence compatible with pulmonary venous hypertension.Airway thickening suggesting bronchitis or reactive airway disease.  Subsegmental atelectasis or scarring in the left midlung. She was given albuterol nebulizer and Solu-Medrol in the ED and admitted for COPD exacerbation.  Patient improved with systemic steroids bronchodilators and antibiotics.  At this time she is able to cough oxygen with ambulation.  She feels well ambulating well no dyspnea or shortness of breath or cough.  She will be discharged home, she has oxygen at home, she will continue on a steroid taper antibiotics, follow-up with PCP ?

## 2022-01-03 NOTE — Progress Notes (Signed)
?  Transition of Care (TOC) Screening Note ? ? ?Patient Details  ?Name: [name] ?Date of Birth: 1956-06-17 ? ? ?Transition of Care (TOC) CM/SW Contact:    ?Brieanne Mignone, Marjie Skiff, RN ?Phone Number: ?01/03/2022, 1:05 PM ? ? ? ?Transition of Care Department [facility]) has reviewed patient and no TOC needs have been identified at this time. We will continue to monitor patient advancement through interdisciplinary progression rounds. If new patient transition needs arise, please place a TOC consult. ?  ?

## 2022-01-03 NOTE — Progress Notes (Signed)
?PROGRESS NOTE ?[name]  [id number] DOB: 05-08-1956 DOA: 01/02/2022 ?PCP: Kerin Perna, NP  ? ?Brief Narrative/Hospital Course: ?53 yof w/ COPD/asthma with nocturnal hypoxia 2L , graves disease s/p iodine ablation, HTN, RLS who presented with increasing shortness of breath started about a week ago with increase cough productive of sputum with intermittent O2 down in the 70% that became more persistent.   ?In the ED, she was afebrile normotensive but hypoxic down to 78% and put on 8 L via nasal cannula.  Mild leukocytosis of 11.2, no anemia, sodium 142, K of 3.3, chloride of 96, CO2 of 38, creatinine of 0.90. ?negative flu and COVID PCR. CXR- W/ upper pulmonary vascular prominence compatible with pulmonary venous hypertension.Airway thickening suggesting bronchitis or reactive airway disease.  Subsegmental atelectasis or scarring in the left midlung. She was given albuterol nebulizer and Solu-Medrol in the ED and admitted for COPD exacerbation.   ?  ?Subjective: ?Seen and examined, on the bedside chair she reports she feels much better this morning. ?On 7 L nasal cannula, normally on 3 L at bedtime.  Previously she was on oxygen daytime but was weaned off by her PCP ? ?Assessment and Plan: ?Principal Problem: ?  COPD exacerbation ([place]) ?Active Problems: ?  Acute on chronic respiratory failure with hypoxia (HCC) ?  HYPERTENSION, BENIGN ESSENTIAL ?  Nocturnal hypoxemia ?  Hypokalemia ? ? ?COPD exacerbation ?Acute on chronic respiratory failure with hypoxia: ?Overall much improving clinically, had significantly diminished breath sounds bilaterally, on 7 L this morning, cont solumedrol 60, duoneb, rocephin, supplemental o2-resume Dulera.Continue to wean down oxygen if no significant improvement or persistent symptoms consult pulmonary ? ?Hypertension-stable, resume home meds. ?Hypokalemia-monitor and replete. ? ?Class I Obesity:Patient's Body mass index is 30.45 kg/m?. : Will benefit with PCP follow-up,  weight loss  healthy lifestyle and outpatient sleep evaluation. ? ? ?DVT prophylaxis: enoxaparin (LOVENOX) injection 40 mg Start: 01/02/22 2200 ?Code Status:   Code Status: Full Code ?Family Communication: plan of care discussed with patient at bedside. ?Patient status is: INPATIENT Level of care: Telemetry  ?Remains inpatient because: Ongoing management of respiratory failure ?Patient currently not stable ? ?Dispo: The patient is from: Home ?           Anticipated disposition: Home 2 days ? ?Mobility Assessment (last 72 hours)   ? ? Mobility Assessment   ? ? [place] Name 01/02/22 2100  ?  ?  ?  ?  ? Does patient have an order for bedrest or is patient medically unstable No - Continue assessment      ? What is the highest level of mobility based on the progressive mobility assessment? Level 4 (Walks with assist in room) - Balance while marching in place and cannot step forward and back - Complete      ? ?  ?  ? ?  ?Objective: ?Vitals last 24 hrs: ?Vitals:  ? 01/03/22 0118 01/03/22 0550 01/03/22 [id number] 01/03/22 0834  ?BP: 132/66 118/76    ?Pulse: 69 64    ?Resp: 18 20    ?Temp: 98.1 ?F (36.7 ?C) 97.8 ?F (36.6 ?C)    ?TempSrc: Oral Oral    ?SpO2: 95% 100% 90% 92%  ?Weight:      ?Height:      ? ?Weight change:  ? ?Physical Examination: ?General exam: AA OX3,older than stated age, weak appearing. ?HEENT:Oral mucosa moist, Ear/Nose WNL grossly, dentition normal. ?Respiratory system: bilaterally significantly diminished BS, no use of accessory muscle ?Cardiovascular system: S1 & S2 +,  No JVD,. ?Gastrointestinal system: Abdomen soft,NT,ND, BS+ ?Nervous System:Alert, awake, moving extremities and grossly nonfocal ?Extremities: LE edema none,distal peripheral pulses palpable.  ?Skin: No rashes,no icterus. ?MSK: Normal muscle bulk,tone, power ? ?Medications reviewed:  ?Scheduled Meds: ? Adult One Daily Gummies  1 tablet Oral q morning  ? aspirin  81 mg Oral q morning  ? Calcium-Phosphorus-Vitamin D   Oral q morning  ? enoxaparin  (LOVENOX) injection  40 mg Subcutaneous QHS  ? hydrochlorothiazide  25 mg Oral q morning  ? ipratropium-albuterol  3 mL Nebulization BID  ? methylPREDNISolone (SOLU-MEDROL) injection  60 mg Intravenous Daily  ? mometasone-formoterol  2 puff Inhalation BID  ? Probiotic Daily  1 capsule Oral q morning  ? traZODone  50 mg Oral QHS  ? ?Continuous Infusions: ? cefTRIAXone (ROCEPHIN)  IV 1 g (30-Jan-2022 2254)  ?  ?Diet Order   ? ?       ?  Diet Heart Room service appropriate? Yes; Fluid consistency: Thin  Diet effective now       ?  ? ?  ?  ? ?  ?No intake or output data in the 24 hours ending 01/03/22 1039 ?Net IO Since Admission: No IO data has been entered for this period [01/03/22 1039]  ?Wt Readings from Last 3 Encounters:  ?2022/01/30 83 kg  ?07/20/21 86.2 kg  ?02/16/21 89.3 kg  ?  ? ?Unresulted Labs (From admission, onward)  ? ? None  ? ?  ?Data Reviewed: I have personally reviewed following labs and imaging studies ?CBC: ?Recent Labs  ?Lab Jan 30, 2022 ?1530 01/03/22 ?0540  ?WBC 11.2* 10.4  ?NEUTROABS 6.5  --   ?HGB 14.2 14.0  ?HCT 46.0 44.5  ?MCV 92.6 93.1  ?PLT 313 317  ? ?Basic Metabolic Panel: ?Recent Labs  ?Lab 01-30-22 ?1530 01/03/22 ?0540  ?NA 142 138  ?K 3.3* 3.6  ?CL 96* 97*  ?CO2 38* 32  ?GLUCOSE 96 153*  ?BUN 17 15  ?CREATININE 0.90 0.85  ?CALCIUM 9.2 8.6*  ? ?GFR: ?Estimated Creatinine Clearance: 70.2 mL/min (by C-G formula based on SCr of 0.85 mg/dL). ?Liver Function Tests: ?No results for input(s): AST, ALT, ALKPHOS, BILITOT, PROT, ALBUMIN in the last 168 hours. ?No results for input(s): LIPASE, AMYLASE in the last 168 hours. ?No results for input(s): AMMONIA in the last 168 hours. ?Coagulation Profile: ?No results for input(s): INR, PROTIME in the last 168 hours. ?BNP (last 3 results) ?No results for input(s): PROBNP in the last 8760 hours. ?HbA1C: ?No results for input(s): HGBA1C in the last 72 hours. ?CBG: ?No results for input(s): GLUCAP in the last 168 hours. ?Lipid Profile: ?No results for input(s):  CHOL, HDL, LDLCALC, TRIG, CHOLHDL, LDLDIRECT in the last 72 hours. ?Thyroid Function Tests: ?No results for input(s): TSH, T4TOTAL, FREET4, T3FREE, THYROIDAB in the last 72 hours. ?Sepsis Labs: ?No results for input(s): PROCALCITON, LATICACIDVEN in the last 168 hours. ? ?Recent Results (from the past 240 hour(s))  ?Resp Panel by RT-PCR (Flu A&B, Covid) Nasopharyngeal Swab     Status: None  ? Collection Time: Jan 30, 2022  3:27 PM  ? Specimen: Nasopharyngeal Swab; Nasopharyngeal(NP) swabs in vial transport medium  ?Result Value Ref Range Status  ? SARS Coronavirus 2 by RT PCR NEGATIVE NEGATIVE Final  ?  Comment: (NOTE) ?SARS-CoV-2 target nucleic acids are NOT DETECTED. ? ?The SARS-CoV-2 RNA is generally detectable in upper respiratory ?specimens during the acute phase of infection. The lowest ?concentration of SARS-CoV-2 viral copies this assay can detect is ?138 copies/mL.  A negative result does not preclude SARS-Cov-2 ?infection and should not be used as the sole basis for treatment or ?other patient management decisions. A negative result may occur with  ?improper specimen collection/handling, submission of specimen other ?than nasopharyngeal swab, presence of viral mutation(s) within the ?areas targeted by this assay, and inadequate number of viral ?copies(<138 copies/mL). A negative result must be combined with ?clinical observations, patient history, and epidemiological ?information. The expected result is Negative. ? ?Fact Sheet for Patients:  ?EntrepreneurPulse.com.au ? ?Fact Sheet for Healthcare Providers:  ?IncredibleEmployment.be ? ?This test is no t yet approved or cleared by the [place] FDA and  ?has been authorized for detection and/or diagnosis of SARS-CoV-2 by ?FDA under an Emergency Use Authorization (EUA). This EUA will remain  ?in effect (meaning this test can be used) for the duration of the ?COVID-19 declaration under Section 564(b)(1) of the Act,  21 ?U.S.C.section 360bbb-3(b)(1), unless the authorization is terminated  ?or revoked sooner.  ? ? ?  ? Influenza A by PCR NEGATIVE NEGATIVE Final  ? Influenza B by PCR NEGATIVE NEGATIVE Final  ?  Comment: (NOTE) ?The

## 2022-01-04 MED ORDER — OYSTER SHELL CALCIUM/D3 500-5 MG-MCG PO TABS
1.0000 | ORAL_TABLET | Freq: Every day | ORAL | Status: DC
  Administered 2022-01-04 – 2022-01-05 (×2): 1 via ORAL
  Filled 2022-01-04 (×2): qty 1

## 2022-01-04 NOTE — Progress Notes (Signed)
SATURATION QUALIFICATIONS: (This note is used to comply with regulatory documentation for home oxygen) ? ?Patient Saturations on Room Air at Rest = 87% ? ?Patient Saturations on 3 Liters of oxygen while Ambulating = 93% ? ?Please briefly explain why patient needs home oxygen: Pt could not maintain oxygenation above 87% without supplemental oxygen via nasal cannula. Placed pt on 3 L , oxygen recovered to 93% while ambulating.  ?

## 2022-01-04 NOTE — Progress Notes (Signed)
?PROGRESS NOTE ?[name]  [id number] DOB: November 30, 1955 DOA: 01/02/2022 ?PCP: Grayce Sessions, NP  ? ?Brief Narrative/Hospital Course: ?70 yof w/ COPD/asthma with nocturnal hypoxia 2L , graves disease s/p iodine ablation, HTN, RLS who presented with increasing shortness of breath started about a week ago with increase cough productive of sputum with intermittent O2 down in the 70% that became more persistent.   ?In the ED, she was afebrile normotensive but hypoxic down to 78% and put on 8 L via nasal cannula.  Mild leukocytosis of 11.2, no anemia, sodium 142, K of 3.3, chloride of 96, CO2 of 38, creatinine of 0.90. ?negative flu and COVID PCR. CXR- W/ upper pulmonary vascular prominence compatible with pulmonary venous hypertension.Airway thickening suggesting bronchitis or reactive airway disease.  Subsegmental atelectasis or scarring in the left midlung. She was given albuterol nebulizer and Solu-Medrol in the ED and admitted for COPD exacerbation.   ?  ?Subjective: ?Seen and examined.  Denies any cough reports breathing is improving, oxygen weaned down to 5 L from 7 ?Overnight afebrile ?Denies fever chills.  Able to ambulate within the room and to the bathroom without dyspnea ? ?Assessment and Plan: ?Principal Problem: ?  COPD exacerbation (HCC) ?Active Problems: ?  Acute on chronic respiratory failure with hypoxia (HCC) ?  HYPERTENSION, BENIGN ESSENTIAL ?  Nocturnal hypoxemia ?  Hypokalemia ? ?Acute COPD exacerbation ?Acute on chronic respiratory failure with hypoxia: ?Patient is slowly improving, moving air better but still with significant diminished breath sounds bilaterally, went down to 5 L [place]-at home on 3 L nocturnally.Cont solumedrol 60, duoneb, rocephin, supplemental o2-resume Dulera. Will get pulmonary ? ?Hypertension-well-controlled, continue HCTZ. ?Hypokalemia-resolved. ?Class I Obesity:Patient's Body mass index is 30.45 kg/m?. : Will benefit with PCP follow-up, weight loss  healthy lifestyle  and outpatient sleep evaluation. ? ? ?DVT prophylaxis: enoxaparin (LOVENOX) injection 40 mg Start: 01/02/22 2200 ?Code Status:   Code Status: Full Code ?Family Communication: plan of care discussed with patient at bedside. ?Patient status is: INPATIENT Level of care: Telemetry  ?Remains inpatient because: Ongoing management of respiratory failure ?Patient currently not stable ? ?Dispo: The patient is from: Home ?           Anticipated disposition: Home 1-2 days ? ?Mobility Assessment (last 72 hours)   ? ? Mobility Assessment   ? ? Row Name 01/03/22 2030 01/02/22 2100  ?  ?  ?  ? Does patient have an order for bedrest or is patient medically unstable Yes- Bedfast (Level 1) - Complete No - Continue assessment     ? What is the highest level of mobility based on the progressive mobility assessment? Level 4 (Walks with assist in room) - Balance while marching in place and cannot step forward and back - Complete Level 4 (Walks with assist in room) - Balance while marching in place and cannot step forward and back - Complete     ? ?  ?  ? ?  ?Objective: ?Vitals last 24 hrs: ?Vitals:  ? 01/03/22 2048 01/04/22 0523 01/04/22 0758 01/04/22 1106  ?BP: 137/71 (!) 116/93    ?Pulse: 71 63    ?Resp: 18 18    ?Temp: 97.9 ?F (36.6 ?C) 98.1 ?F (36.7 ?C)    ?TempSrc: Oral Oral    ?SpO2: 95% 96% 98% 93%  ?Weight:      ?Height:      ? ?Weight change:  ? ?Physical Examination: ?General exam: AAOX3,older than stated age, weak appearing. ?HEENT:Oral mucosa moist, Ear/Nose WNL grossly,  dentition normal. ?Respiratory system: bilaterally diminished mostly at bases, does have AIR ENTRY on anterior chest bilaterally,no use of accessory muscle ?Cardiovascular system: S1 & S2 +, No JVD,. ?Gastrointestinal system: Abdomen soft,NT,ND, BS+ ?Nervous System:Alert, awake, moving extremities and grossly nonfocal ?Extremities: edema neg,distal peripheral pulses palpable.  ?Skin: No rashes,no icterus. ?MSK: Normal muscle bulk,tone, power ? ? ?Medications  reviewed:  ?Scheduled Meds: ? aspirin  81 mg Oral q morning  ? calcium-vitamin D  1 tablet Oral Q breakfast  ? enoxaparin (LOVENOX) injection  40 mg Subcutaneous QHS  ? hydrochlorothiazide  25 mg Oral q morning  ? ipratropium-albuterol  3 mL Nebulization QID  ? methylPREDNISolone (SOLU-MEDROL) injection  60 mg Intravenous Daily  ? mometasone-formoterol  2 puff Inhalation BID  ? multivitamin with minerals  1 tablet Oral q morning  ? saccharomyces boulardii  250 mg Oral q morning  ? traZODone  50 mg Oral QHS  ? ?Continuous Infusions: ? cefTRIAXone (ROCEPHIN)  IV 1 g (01/03/22 2222)  ?  ?Diet Order   ? ?       ?  Diet Heart Room service appropriate? Yes; Fluid consistency: Thin  Diet effective now       ?  ? ?  ?  ? ?  ? ?Intake/Output Summary (Last 24 hours) at 01/04/2022 1136 ?Last data filed at 01/04/2022 0900 ?Gross per 24 hour  ?Intake 580 ml  ?Output --  ?Net 580 ml  ? ?Net IO Since Admission: 797.99 mL [01/04/22 1136]  ?Wt Readings from Last 3 Encounters:  ?01/09/22 83 kg  ?07/20/21 86.2 kg  ?02/16/21 89.3 kg  ?  ? ?Unresulted Labs (From admission, onward)  ? ? None  ? ?  ?Data Reviewed: I have personally reviewed following labs and imaging studies ?CBC: ?Recent Labs  ?Lab 2022/01/09 ?1530 01/03/22 ?0540  ?WBC 11.2* 10.4  ?NEUTROABS 6.5  --   ?HGB 14.2 14.0  ?HCT 46.0 44.5  ?MCV 92.6 93.1  ?PLT 313 317  ? ?Basic Metabolic Panel: ?Recent Labs  ?Lab 2022/01/09 ?1530 01/03/22 ?0540  ?NA 142 138  ?K 3.3* 3.6  ?CL 96* 97*  ?CO2 38* 32  ?GLUCOSE 96 153*  ?BUN 17 15  ?CREATININE 0.90 0.85  ?CALCIUM 9.2 8.6*  ? ?GFR: ?Estimated Creatinine Clearance: 70.2 mL/min (by C-G formula based on SCr of 0.85 mg/dL). ?Liver Function Tests: ?No results for input(s): AST, ALT, ALKPHOS, BILITOT, PROT, ALBUMIN in the last 168 hours. ?No results for input(s): LIPASE, AMYLASE in the last 168 hours. ?No results for input(s): AMMONIA in the last 168 hours. ?Coagulation Profile: ?No results for input(s): INR, PROTIME in the last 168 hours. ?BNP  (last 3 results) ?No results for input(s): PROBNP in the last 8760 hours. ?HbA1C: ?No results for input(s): HGBA1C in the last 72 hours. ?CBG: ?No results for input(s): GLUCAP in the last 168 hours. ?Lipid Profile: ?No results for input(s): CHOL, HDL, LDLCALC, TRIG, CHOLHDL, LDLDIRECT in the last 72 hours. ?Thyroid Function Tests: ?No results for input(s): TSH, T4TOTAL, FREET4, T3FREE, THYROIDAB in the last 72 hours. ?Sepsis Labs: ?No results for input(s): PROCALCITON, LATICACIDVEN in the last 168 hours. ? ?Recent Results (from the past 240 hour(s))  ?Resp Panel by RT-PCR (Flu A&B, Covid) Nasopharyngeal Swab     Status: None  ? Collection Time: 01/09/22  3:27 PM  ? Specimen: Nasopharyngeal Swab; Nasopharyngeal(NP) swabs in vial transport medium  ?Result Value Ref Range Status  ? SARS Coronavirus 2 by RT PCR NEGATIVE NEGATIVE Final  ?  Comment: (NOTE) ?SARS-CoV-2 target nucleic acids are NOT DETECTED. ? ?The SARS-CoV-2 RNA is generally detectable in upper respiratory ?specimens during the acute phase of infection. The lowest ?concentration of SARS-CoV-2 viral copies this assay can detect is ?138 copies/mL. A negative result does not preclude SARS-Cov-2 ?infection and should not be used as the sole basis for treatment or ?other patient management decisions. A negative result may occur with  ?improper specimen collection/handling, submission of specimen other ?than nasopharyngeal swab, presence of viral mutation(s) within the ?areas targeted by this assay, and inadequate number of viral ?copies(<138 copies/mL). A negative result must be combined with ?clinical observations, patient history, and epidemiological ?information. The expected result is Negative. ? ?Fact Sheet for Patients:  ?BloggerCourse.comhttps://www.fda.gov/media/152166/download ? ?Fact Sheet for Healthcare Providers:  ?SeriousBroker.ithttps://www.fda.gov/media/152162/download ? ?This test is no t yet approved or cleared by the [place] FDA and  ?has been authorized for detection  and/or diagnosis of SARS-CoV-2 by ?FDA under an Emergency Use Authorization (EUA). This EUA will remain  ?in effect (meaning this test can be used) for the duration of the ?COVID-19 declaration under [place]

## 2022-01-05 MED ORDER — AMOXICILLIN-POT CLAVULANATE 875-125 MG PO TABS: 1.0000 | ORAL_TABLET | Freq: Two times a day (BID) | ORAL | 0 refills | Status: AC

## 2022-01-05 MED ORDER — PREDNISONE 10 MG PO TABS: ORAL_TABLET | ORAL | 0 refills | Status: DC

## 2022-01-05 NOTE — Discharge Summary (Signed)
Physician Discharge Summary  ?[name] QQV:[id number] DOB: October 17, 1955 DOA: 01/02/2022 ? ?PCP: Grayce Sessions, NP ? ?Admit date: 01/02/2022 ?Discharge date: 01/05/2022 ?Recommendations for Outpatient Follow-up:  ?Follow up with PCP in 1 weeks-call for appointment ?Please obtain BMP/CBC in one week ? ?Discharge Dispo: home ?Discharge Condition: Stable ?Code Status:   Code Status: Full Code ?Diet recommendation:  ?Diet Order   ? ?       ?  Diet Heart Room service appropriate? Yes; Fluid consistency: Thin  Diet effective now       ?  ? ?  ?  ? ?  ?  ? ?Brief/Interim Summary: ?14 yof w/ COPD/asthma with nocturnal hypoxia 2L , graves disease s/p iodine ablation, HTN, RLS who presented with increasing shortness of breath started about a week ago with increase cough productive of sputum with intermittent O2 down in the 70% that became more persistent.   ?In the ED, she was afebrile normotensive but hypoxic down to 78% and put on 8 L via nasal cannula.  Mild leukocytosis of 11.2, no anemia, sodium 142, K of 3.3, chloride of 96, CO2 of 38, creatinine of 0.90. ?negative flu and COVID PCR. CXR- W/ upper pulmonary vascular prominence compatible with pulmonary venous hypertension.Airway thickening suggesting bronchitis or reactive airway disease.  Subsegmental atelectasis or scarring in the left midlung. She was given albuterol nebulizer and Solu-Medrol in the ED and admitted for COPD exacerbation.  Patient improved with systemic steroids bronchodilators and antibiotics.  At this time she is able to cough oxygen with ambulation.  She feels well ambulating well no dyspnea or shortness of breath or cough.  She will be discharged home, she has oxygen at home, she will continue on a steroid taper antibiotics, follow-up with PCP  ? ?Discharge Diagnoses:  ?Principal Problem: ?  COPD exacerbation (HCC) ?Active Problems: ?  Acute on chronic respiratory failure with hypoxia (HCC) ?  HYPERTENSION, BENIGN ESSENTIAL ?  Nocturnal  hypoxemia ?  Hypokalemia ? ?Acute COPD exacerbation ?Acute on chronic respiratory failure with hypoxia: ?Patient improved with systemic steroids bronchodilators and antibiotics.  At this time she is able to cough oxygen with ambulation.  She feels well ambulating well no dyspnea or shortness of breath or cough.  She will be discharged home, she has oxygen at home, she will continue on a steroid taper antibiotics, follow-up with PCP ?  ?Hypertension-well-controlled, continue HCTZ. ?Hypokalemia-resolved. ?Class I Obesity:Patient's Body mass index is 30.45 kg/m?. : Will benefit with PCP follow-up, weight loss  healthy lifestyle and outpatient sleep evaluation ? ?Consults: ?none ?Subjective: ?Alert awake ambulating, did well did not need oxygen ambulation although did need transiently at rest at home, on already has home oxygen ? ?Discharge Exam: ?Vitals:  ? 01/05/22 1100 01/05/22 1134  ?BP:    ?Pulse:    ?Resp:    ?Temp:    ?SpO2: (!) 88% (!) 89%  ? ?General: Pt is alert, awake, not in acute distress ?Cardiovascular: RRR, S1/S2 +, no rubs, no gallops ?Respiratory: CTA bilaterally, no wheezing, no rhonchi ?Abdominal: Soft, NT, ND, bowel sounds + ?Extremities: no edema, no cyanosis ? ?Discharge Instructions ? ?Discharge Instructions   ? ? Discharge instructions   Complete by: As directed ?  ? Please call call MD or return to ER for similar or worsening recurring problem that brought you to hospital or if any fever,nausea/vomiting,abdominal pain, uncontrolled pain, chest pain,  shortness of breath or any other alarming symptoms. ? ?Please follow-up your doctor as instructed in a  week time and call the office for appointment. ? ?Please avoid alcohol, smoking, or any other illicit substance and maintain healthy habits including taking your regular medications as prescribed. ? ?You were cared for by a hospitalist during your hospital stay. If you have any questions about your discharge medications or the care you received  while you were in the hospital after you are discharged, you can call the unit and ask to speak with the hospitalist on call if the hospitalist that took care of you is not available. ? ?Once you are discharged, your primary care physician will handle any further medical issues. Please note that NO REFILLS for any discharge medications will be authorized once you are discharged, as it is imperative that you return to your primary care physician (or establish a relationship with a primary care physician if you do not have one) for your aftercare needs so that they can reassess your need for medications and monitor your lab values  ? Increase activity slowly   Complete by: As directed ?  ? ?  ? ?Allergies as of 01/05/2022   ?No Known Allergies ?  ? ?  ?Medication List  ?  ? ?TAKE these medications   ? ?Adult One Daily Gummies Chew ?Chew 1 tablet by mouth every morning. ?  ?albuterol (5 MG/ML) 0.5% nebulizer solution ?Commonly known as: PROVENTIL ?Take 0.5 mLs (2.5 mg total) by nebulization every 4 (four) hours while awake for 3 days, THEN 0.5 mLs (2.5 mg total) every 4 (four) hours as needed for wheezing or shortness of breath. ?Start taking on: July 21, 2021 ?What changed: See the new instructions. ?  ?albuterol 108 (90 Base) MCG/ACT inhaler ?Commonly known as: VENTOLIN HFA ?Inhale 2 puffs into the lungs every 6 (six) hours as needed for wheezing or shortness of breath. ?What changed: Another medication with the same name was changed. Make sure you understand how and when to take each. ?  ?amoxicillin-clavulanate 875-125 MG tablet ?Commonly known as: Augmentin ?Take 1 tablet by mouth 2 (two) times daily for 5 days. ?  ?aspirin 81 MG chewable tablet ?Chew 81 mg by mouth every morning. ?  ?budesonide-formoterol 160-4.5 MCG/ACT inhaler ?Commonly known as: SYMBICORT ?Inhale 2 puffs into the lungs 2 (two) times daily. ?What changed:  ?when to take this ?reasons to take this ?  ?CALCIUM/VITAMIN D3/ADULT GUMMY PO ?Take 1  tablet by mouth every morning. ?  ?escitalopram 10 MG tablet ?Commonly known as: LEXAPRO ?Take 10 mg by mouth at bedtime. ?  ?fluticasone 50 MCG/ACT nasal spray ?Commonly known as: FLONASE ?Place 1 spray into both nostrils daily as needed for allergies or rhinitis. ?  ?hydrochlorothiazide 25 MG tablet ?Commonly known as: HYDRODIURIL ?Take 25 mg by mouth every morning. ?  ?ibuprofen 800 MG tablet ?Commonly known as: ADVIL ?Take 800 mg by mouth every 8 (eight) hours as needed for moderate pain. ?  ?OXYGEN ?Inhale 3 L into the lungs at bedtime. ?  ?predniSONE 10 MG tablet ?Commonly known as: DELTASONE ?Take PO 4 tabs daily x 2 days,3 tabs daily x 2 days,2 tabs daily x 2 days,1 tab daily x 2 days then stop. ?What changed:  ?how much to take ?how to take this ?when to take this ?additional instructions ?  ?Probiotic Daily Caps ?Take 1 capsule by mouth every morning. ?  ?traZODone 50 MG tablet ?Commonly known as: DESYREL ?Take 50 mg by mouth at bedtime. ?  ? ?  ? ? ?No Known Allergies ? ?The results  of significant diagnostics from this hospitalization (including imaging, microbiology, ancillary and laboratory) are listed below for reference.   ? ?Microbiology: ?Recent Results (from the past 240 hour(s))  ?Resp Panel by RT-PCR (Flu A&B, Covid) Nasopharyngeal Swab     Status: None  ? Collection Time: 01/02/22  3:27 PM  ? Specimen: Nasopharyngeal Swab; Nasopharyngeal(NP) swabs in vial transport medium  ?Result Value Ref Range Status  ? SARS Coronavirus 2 by RT PCR NEGATIVE NEGATIVE Final  ?  Comment: (NOTE) ?SARS-CoV-2 target nucleic acids are NOT DETECTED. ? ?The SARS-CoV-2 RNA is generally detectable in upper respiratory ?specimens during the acute phase of infection. The lowest ?concentration of SARS-CoV-2 viral copies this assay can detect is ?138 copies/mL. A negative result does not preclude SARS-Cov-2 ?infection and should not be used as the sole basis for treatment or ?other patient management decisions. A negative  result may occur with  ?improper specimen collection/handling, submission of specimen other ?than nasopharyngeal swab, presence of viral mutation(s) within the ?areas targeted by this assay, and inadequate nu

## 2022-01-06 ENCOUNTER — Telehealth: Payer: Self-pay

## 2022-01-06 NOTE — Telephone Encounter (Signed)
Transition Care Management Unsuccessful Follow-up Telephone Call ? ?Date of discharge and from where:  [facility] 01/05/[facility]22 ? ?Attempts:  1st Attempt ? ?Reason for unsuccessful TCM follow-up call:  Left voice message unable to reach pt at [phone]  ? ?  ?

## 2022-01-09 ENCOUNTER — Telehealth: Payer: Self-pay

## 2022-01-09 NOTE — Telephone Encounter (Signed)
Transition Care Management Follow-up Telephone Call ?Date of discharge and from where: 01/05/2022, [facility]  ?How have you been since you were released from the hospital? She stated that she is feeling a lot better.  ?Any questions or concerns? Yes - she would like a humidifier for her O2 concentrator  ? ?Items Reviewed: ?Did the pt receive and understand the discharge instructions provided? Yes  ?Medications obtained and verified? Yes - she said she has all of her medications and did not have any questions about her med regime.  She has a nebulizer.  ?Other? No  ?Any new allergies since your discharge? No  ?Dietary orders reviewed? No ?Do you have support at home? Yes  ? ?Home Care and Equipment/Supplies: ?Were home health services ordered? no ?If so, what is the name of the agency? N/a  ?Has the agency set up a time to come to the patient's home? not applicable ?Were any new equipment or medical supplies ordered?  No ?What is the name of the medical supply agency? N/a ?Were you able to get the supplies/equipment? not applicable ?Do you have any questions related to the use of the equipment or supplies? No ? ?Has home O2 that she has been using most of the time since returning home from the hospital.  She uses it @ 3L/min. She also has an oximeter.  ? ?Functional Questionnaire: (I = Independent and D = Dependent) ?ADLs: independent ?Follow up appointments reviewed: ? ?PCP Hospital f/u appt confirmed?  She sees Juluis Mire, NP at [facility] and she will need to call to schedule her follow up appointment   ?[facility] f/u appt confirmed?  None scheduled at this time.    ?Are transportation arrangements needed? No  ?If their condition worsens, is the pt aware to call PCP or go to the Emergency Dept.? Yes ?Was the patient provided with contact information for the PCP's office or ED? Yes ?Was to pt encouraged to call back with questions or concerns? Yes ? ?

## 2022-01-11 NOTE — Telephone Encounter (Signed)
noted 

## 2022-01-13 ENCOUNTER — Emergency Department (HOSPITAL_COMMUNITY)
Admission: EM | Admit: 2022-01-13 | Discharge: 2022-01-13 | Disposition: A | Discharge status: Home / Self Care | Payer: Medicare Other | Attending: Emergency Medicine | Admitting: Emergency Medicine

## 2022-01-13 ENCOUNTER — Other Ambulatory Visit: Payer: Self-pay

## 2022-01-13 ENCOUNTER — Emergency Department (HOSPITAL_COMMUNITY): Payer: Medicare Other

## 2022-01-13 ENCOUNTER — Encounter (HOSPITAL_COMMUNITY): Payer: Self-pay

## 2022-01-13 DIAGNOSIS — R1010 Upper abdominal pain, unspecified: Secondary | ICD-10-CM

## 2022-01-13 DIAGNOSIS — Z7951 Long term (current) use of inhaled steroids: Secondary | ICD-10-CM | POA: Diagnosis not present

## 2022-01-13 DIAGNOSIS — Z7952 Long term (current) use of systemic steroids: Secondary | ICD-10-CM | POA: Diagnosis not present

## 2022-01-13 DIAGNOSIS — Z7982 Long term (current) use of aspirin: Secondary | ICD-10-CM | POA: Diagnosis not present

## 2022-01-13 DIAGNOSIS — R1013 Epigastric pain: Secondary | ICD-10-CM | POA: Diagnosis not present

## 2022-01-13 DIAGNOSIS — J449 Chronic obstructive pulmonary disease, unspecified: Secondary | ICD-10-CM | POA: Diagnosis not present

## 2022-01-13 LAB — CBC
HCT: 44.1 % (ref 36.0–46.0)
Hemoglobin: 14.3 g/dL (ref 12.0–15.0)
MCH: 29.5 pg (ref 26.0–34.0)
MCHC: 32.4 g/dL (ref 30.0–36.0)
MCV: 91.1 fL (ref 80.0–100.0)
Platelets: 282 10*3/uL (ref 150–400)
RBC: 4.84 MIL/uL (ref 3.87–5.11)
RDW: 13.8 % (ref 11.5–15.5)
WBC: 9.8 10*3/uL (ref 4.0–10.5)
nRBC: 0 % (ref 0.0–0.2)

## 2022-01-13 LAB — LIPASE, BLOOD: Lipase: 39 U/L (ref 11–51)

## 2022-01-13 LAB — TROPONIN I (HIGH SENSITIVITY)
Troponin I (High Sensitivity): 3 ng/L (ref <18)
Troponin I (High Sensitivity): 3 ng/L (ref <18)

## 2022-01-13 LAB — COMPREHENSIVE METABOLIC PANEL
ALT: 16 U/L (ref 0–44)
AST: 13 U/L — ABNORMAL LOW (ref 15–41)
Albumin: 3.5 g/dL (ref 3.5–5.0)
Alkaline Phosphatase: 57 U/L (ref 38–126)
Anion gap: 9 (ref 5–15)
BUN: 18 mg/dL (ref 8–23)
CO2: 30 mmol/L (ref 22–32)
Calcium: 9.2 mg/dL (ref 8.9–10.3)
Chloride: 99 mmol/L (ref 98–111)
Creatinine, Ser: 0.89 mg/dL (ref 0.44–1.00)
GFR, Estimated: >60 mL/min (ref >60)
Glucose, Bld: 91 mg/dL (ref 70–99)
Potassium: 3.9 mmol/L (ref 3.5–5.1)
Sodium: 138 mmol/L (ref 135–145)
Total Bilirubin: 0.8 mg/dL (ref 0.3–1.2)
Total Protein: 6.3 g/dL — ABNORMAL LOW (ref 6.5–8.1)

## 2022-01-13 MED ORDER — PANTOPRAZOLE SODIUM 20 MG PO TBEC: 20.0000 mg | DELAYED_RELEASE_TABLET | Freq: Every day | ORAL | 0 refills | Status: DC

## 2022-01-13 NOTE — ED Provider Notes (Signed)
?[facility]-EMERGENCY DEPT ?Provider Note ? ? ?CSN: [id number] ?Arrival date & time: 01/13/22  1142 ? ?  ? ?History ? ?Chief Complaint  ?Patient presents with  ? Abdominal Pain  ? ? ?[name] is a 66 y.o. female. ? ?Patient complains of epigastric pain.  She was recently placed on antibiotics and steroids and feels like she is getting GERD from that.  Patient right now is no longer taking this medicine.  Patient has a history of COPD and is on 3 L of oxygen ? ?The history is provided by the patient. No language interpreter was used.  ?Abdominal Pain ?Pain location:  Epigastric ?Pain quality: aching   ?Pain radiates to:  Does not radiate ?Pain severity:  Moderate ?Onset quality:  Sudden ?Timing:  Constant ?Progression:  Waxing and waning ?Chronicity:  New ?Context: not alcohol use   ?Relieved by:  Nothing ?Worsened by:  Nothing ?Ineffective treatments:  None tried ?Associated symptoms: no chest pain, no cough, no diarrhea, no fatigue and no hematuria   ? ?  ? ?Home Medications ?Prior to Admission medications   ?Medication Sig Start Date End Date Taking? Authorizing Provider  ?pantoprazole (PROTONIX) 20 MG tablet Take 1 tablet (20 mg total) by mouth daily. 01/13/22  Yes Bethann BerkshireZammit, Boluwatife Flight, MD  ?albuterol (PROVENTIL HFA;VENTOLIN HFA) 108 (90 BASE) MCG/ACT inhaler Inhale 2 puffs into the lungs every 6 (six) hours as needed for wheezing or shortness of breath.    [provider]  ?albuterol (PROVENTIL) (5 MG/ML) 0.5% nebulizer solution Take 0.5 mLs (2.5 mg total) by nebulization every 4 (four) hours while awake for 3 days, THEN 0.5 mLs (2.5 mg total) every 4 (four) hours as needed for wheezing or shortness of breath. ?Patient taking differently: Inhale 0.5 mLs (2.5 mg total) every 4 (four) hours as needed for wheezing or shortness of breath. 07/21/21 01/03/22  Narda BondsNettey, Ralph A, MD  ?aspirin 81 MG chewable tablet Chew 81 mg by mouth every morning.    [provider]  ?budesonide-formoterol  (SYMBICORT) 160-4.5 MCG/ACT inhaler Inhale 2 puffs into the lungs 2 (two) times daily. ?Patient taking differently: Inhale 2 puffs into the lungs 2 (two) times daily as needed (shortness of breath/wheezing). 11/25/18   Pokhrel, Rebekah ChesterfieldLaxman, MD  ?Calcium-Phosphorus-Vitamin D (CALCIUM/VITAMIN D3/ADULT GUMMY PO) Take 1 tablet by mouth every morning.    [provider]  ?escitalopram (LEXAPRO) 10 MG tablet Take 10 mg by mouth at bedtime. 09/18/18   [provider]  ?fluticasone (FLONASE) 50 MCG/ACT nasal spray Place 1 spray into both nostrils daily as needed for allergies or rhinitis.    [provider]  ?hydrochlorothiazide (HYDRODIURIL) 25 MG tablet Take 25 mg by mouth every morning. 09/03/15   [provider]  ?ibuprofen (ADVIL) 800 MG tablet Take 800 mg by mouth every 8 (eight) hours as needed for moderate pain. 06/27/21   [provider]  ?Multiple Vitamins-Minerals (ADULT ONE DAILY GUMMIES) CHEW Chew 1 tablet by mouth every morning.    [provider]  ?OXYGEN Inhale 3 L into the lungs at bedtime.    [provider]  ?predniSONE (DELTASONE) 10 MG tablet Take PO 4 tabs daily x 2 days,3 tabs daily x 2 days,2 tabs daily x 2 days,1 tab daily x 2 days then stop. 01/05/22   Lanae BoastKc, Ramesh, MD  ?Probiotic Product (PROBIOTIC DAILY) CAPS Take 1 capsule by mouth every morning.    [provider]  ?traZODone (DESYREL) 50 MG tablet Take 50 mg by mouth at  bedtime. 02/07/21   [provider]  ?   ? ?Allergies    ?Patient has no known allergies.   ? ?Review of Systems   ?Review of Systems  ?Constitutional:  Negative for appetite change and fatigue.  ?HENT:  Negative for congestion, ear discharge and sinus pressure.   ?Eyes:  Negative for discharge.  ?Respiratory:  Negative for cough.   ?Cardiovascular:  Negative for chest pain.  ?Gastrointestinal:  Positive for abdominal pain. Negative for diarrhea.  ?Genitourinary:  Negative for frequency and hematuria.   ?Musculoskeletal:  Negative for back pain.  ?Skin:  Negative for rash.  ?Neurological:  Negative for seizures and headaches.  ?Psychiatric/Behavioral:  Negative for hallucinations.   ? ?Physical Exam ?Updated Vital Signs ?BP 122/86   Pulse (!) 58   Temp (!) 97.5 ?F (36.4 ?C) (Oral)   Resp 18   Ht 5\' 5"  (1.651 m)   Wt 81.6 kg   SpO2 95%   BMI 29.95 kg/m?  ?Physical Exam ?Vitals and nursing note reviewed.  ?Constitutional:   ?   Appearance: She is well-developed.  ?HENT:  ?   Head: Normocephalic.  ?   Nose: Nose normal.  ?Eyes:  ?   General: No scleral icterus. ?   Conjunctiva/sclera: Conjunctivae normal.  ?Neck:  ?   Thyroid: No thyromegaly.  ?Cardiovascular:  ?   Rate and Rhythm: Normal rate and regular rhythm.  ?   Heart sounds: No murmur heard. ?  No friction rub. No gallop.  ?Pulmonary:  ?   Breath sounds: No stridor. No wheezing or rales.  ?Chest:  ?   Chest wall: No tenderness.  ?Abdominal:  ?   General: There is no distension.  ?   Tenderness: There is no abdominal tenderness. There is no rebound.  ?Musculoskeletal:     ?   General: Normal range of motion.  ?   Cervical back: Neck supple.  ?Lymphadenopathy:  ?   Cervical: No cervical adenopathy.  ?Skin: ?   Findings: No erythema or rash.  ?Neurological:  ?   Mental Status: She is alert and oriented to person, place, and time.  ?   Motor: No abnormal muscle tone.  ?   Coordination: Coordination normal.  ?Psychiatric:     ?   Behavior: Behavior normal.  ? ? ?ED Results / Procedures / Treatments   ?Labs ?(all labs ordered are listed, but only abnormal results are displayed) ?Labs Reviewed  ?COMPREHENSIVE METABOLIC PANEL - Abnormal; Notable for the following components:  ?    Result Value  ? Total Protein 6.3 (*)   ? AST 13 (*)   ? All other components within normal limits  ?LIPASE, BLOOD  ?CBC  ?URINALYSIS, ROUTINE W REFLEX MICROSCOPIC  ?TROPONIN I (HIGH SENSITIVITY)  ?TROPONIN I (HIGH SENSITIVITY)  ? ? ?EKG ?EKG Interpretation ? ?Date/Time:  Friday Jan 13 2022 12:44:09 EDT ?Ventricular Rate:  57 ?PR Interval:  138 ?QRS Duration: 80 ?QT Interval:  431 ?QTC Calculation: 420 ?R Axis:   109 ?Text Interpretation: Sinus rhythm Right axis deviation Minimal ST elevation, inferior leads Since last tracing Nonspecific T wave abnormality is present Otherwise no significant change Confirmed by [id number] [phone]) on 01/13/2022 1:27:29 PM ? ?Radiology ?DG Chest 2 View ? ?Result Date: 01/13/2022 ?CLINICAL DATA:  Shortness of breath.  Abdominal pain. EXAM: CHEST - 2 VIEW COMPARISON:  Chest two views 01/02/2022, AP chest 07/20/2021 FINDINGS: Cardiac silhouette is moderately enlarged. Mediastinal contours are within normal limits. Mild calcification is  again seen within the aortic arch. Mild bilateral interstitial thickening is unchanged multiple prior radiographs. No focal airspace opacity to indicate pneumonia. No pleural effusion or pneumothorax. Mild dextrocurvature of the midthoracic spine with moderate to severe multilevel degenerative disc changes. Mildly increased kyphotic angulation is similar to prior. IMPRESSION: Stable cardiomegaly. Chronic bilateral interstitial thickening may represent mild chronic interstitial lung disease versus chronic interstitial pulmonary edema. Electronically Signed   By: Neita Garnet M.D.   On: 01/13/2022 13:03   ? ?Procedures ?Procedures  ? ? ?Medications Ordered in ED ?Medications - No data to display ? ?ED Course/ Medical Decision Making/ A&P ?  ?                        ?Medical Decision Making ?Amount and/or Complexity of Data Reviewed ?Labs: ordered. ? ?Risk ?Prescription drug management. ? ?This patient presents to the ED for concern of epigastric pain, this involves an extensive number of treatment options, and is a complaint that carries with it a high risk of complications and morbidity.  The differential diagnosis includes GERD, ulcers ? ? ?Co morbidities that complicate the patient evaluation ? ?COPD ? ? ?Additional history  obtained: ? ?Additional history obtained from patient ?External records from outside source obtained and reviewed including hospital record ? ? ?Lab Tests: ? ?I Ordered, and personally interpreted labs.  The pertinent resu

## 2022-01-13 NOTE — Discharge Instructions (Signed)
Follow-up with your family doctor as planned for recheck of this abdominal discomfort ?

## 2022-01-13 NOTE — ED Triage Notes (Signed)
Patient c/o mid abdominal pain that started this AM. Patient denies any N/v/D. ? ? ?Patient states she wears O2 3L/min via [place] and called her doctor stating that her oxygen level had dropped to 87% with the O2 3L/min on. ? ?Patient arrived to the ED with no oxygen on and sats were 81%. Patient placed on oxygen 3L/min and sats increased to 95%. ?

## 2022-01-13 NOTE — ED Provider Triage Note (Signed)
Emergency Medicine Provider Triage Evaluation Note ? ?[name] , a 66 y.o. female  was evaluated in triage.  Pt complains of presents with abd pain. Onset this morning. She has been having reflux sxs for a long time. Today felt extremely weak and shakey, sob and 02 sats low on her 3L. ? ?Review of Systems  ?Positive: Abd pain  ?Negative: fever ? ?Physical Exam  ?BP (!) 143/84 (BP Location: Left Arm)   Pulse 78   Temp (!) 97.5 ?F (36.4 ?C) (Oral)   Resp 18   Ht 5\' 5"  (1.651 m)   Wt 81.6 kg   SpO2 91%   BMI 29.95 kg/m?  ?Gen:   Awake, no distress   ?Resp:  Normal effort  ?MSK:   Moves extremities without difficulty  ?Other:  Decrease lung sound in the bases and ttp lower abd ? ?Medical Decision Making  ?Medically screening exam initiated at 12:26 PM.  Appropriate orders placed.  [name] was informed that the remainder of the evaluation will be completed by another provider, this initial triage assessment does not replace that evaluation, and the importance of remaining in the ED until their evaluation is complete. ? ?Work up inititated. ? Pt sxs of reflux started on abx and prednisone. ?  ?Margarita Mail, PA-C ?01/13/22 1231 ? ?

## 2022-01-19 ENCOUNTER — Ambulatory Visit (INDEPENDENT_AMBULATORY_CARE_PROVIDER_SITE_OTHER): Payer: Medicare Other | Admitting: Primary Care

## 2022-05-23 ENCOUNTER — Other Ambulatory Visit: Payer: Self-pay

## 2022-10-16 ENCOUNTER — Telehealth: Payer: Self-pay | Admitting: Emergency Medicine

## 2022-10-16 NOTE — Telephone Encounter (Signed)
This pt will be considered a new pt. Pt will need to establish care

## 2022-10-16 NOTE — Telephone Encounter (Signed)
Copied from [place] [phone]. Topic: Medicare AWV >> Oct 16, 2022 10:44 AM Sabas Sous wrote: Reason for CRM: Pt wants to schedule her [place] sometime after march 1st  Best contact: [phone]

## 2024-05-18 ENCOUNTER — Emergency Department (HOSPITAL_COMMUNITY)

## 2024-05-18 ENCOUNTER — Inpatient Hospital Stay (HOSPITAL_COMMUNITY)
Admission: EM | Admit: 2024-05-18 | Discharge: 2024-05-21 | DRG: 189 | Disposition: A | Discharge status: Home / Self Care | Attending: Family Medicine | Admitting: Family Medicine

## 2024-05-18 ENCOUNTER — Inpatient Hospital Stay (HOSPITAL_COMMUNITY)

## 2024-05-18 ENCOUNTER — Encounter (HOSPITAL_COMMUNITY): Payer: Self-pay

## 2024-05-18 ENCOUNTER — Other Ambulatory Visit: Payer: Self-pay

## 2024-05-18 DIAGNOSIS — J9622 Acute and chronic respiratory failure with hypercapnia: Secondary | ICD-10-CM | POA: Diagnosis present

## 2024-05-18 DIAGNOSIS — Z6835 Body mass index (BMI) 35.0-35.9, adult: Secondary | ICD-10-CM | POA: Diagnosis not present

## 2024-05-18 DIAGNOSIS — Z841 Family history of disorders of kidney and ureter: Secondary | ICD-10-CM | POA: Diagnosis not present

## 2024-05-18 DIAGNOSIS — F419 Anxiety disorder, unspecified: Secondary | ICD-10-CM | POA: Diagnosis present

## 2024-05-18 DIAGNOSIS — I10 Essential (primary) hypertension: Secondary | ICD-10-CM | POA: Diagnosis not present

## 2024-05-18 DIAGNOSIS — F32A Depression, unspecified: Secondary | ICD-10-CM | POA: Diagnosis present

## 2024-05-18 DIAGNOSIS — Z7982 Long term (current) use of aspirin: Secondary | ICD-10-CM | POA: Diagnosis not present

## 2024-05-18 DIAGNOSIS — R0609 Other forms of dyspnea: Secondary | ICD-10-CM | POA: Diagnosis not present

## 2024-05-18 DIAGNOSIS — B9789 Other viral agents as the cause of diseases classified elsewhere: Secondary | ICD-10-CM | POA: Diagnosis present

## 2024-05-18 DIAGNOSIS — F17210 Nicotine dependence, cigarettes, uncomplicated: Secondary | ICD-10-CM | POA: Diagnosis present

## 2024-05-18 DIAGNOSIS — Z8249 Family history of ischemic heart disease and other diseases of the circulatory system: Secondary | ICD-10-CM | POA: Diagnosis not present

## 2024-05-18 DIAGNOSIS — E872 Acidosis, unspecified: Secondary | ICD-10-CM | POA: Diagnosis present

## 2024-05-18 DIAGNOSIS — G2581 Restless legs syndrome: Secondary | ICD-10-CM | POA: Diagnosis present

## 2024-05-18 DIAGNOSIS — F341 Dysthymic disorder: Secondary | ICD-10-CM | POA: Diagnosis not present

## 2024-05-18 DIAGNOSIS — Z7951 Long term (current) use of inhaled steroids: Secondary | ICD-10-CM | POA: Diagnosis not present

## 2024-05-18 DIAGNOSIS — I119 Hypertensive heart disease without heart failure: Secondary | ICD-10-CM | POA: Diagnosis present

## 2024-05-18 DIAGNOSIS — J440 Chronic obstructive pulmonary disease with acute lower respiratory infection: Secondary | ICD-10-CM | POA: Diagnosis present

## 2024-05-18 DIAGNOSIS — J9621 Acute and chronic respiratory failure with hypoxia: Secondary | ICD-10-CM | POA: Diagnosis present

## 2024-05-18 DIAGNOSIS — J441 Chronic obstructive pulmonary disease with (acute) exacerbation: Secondary | ICD-10-CM | POA: Diagnosis present

## 2024-05-18 DIAGNOSIS — Z79899 Other long term (current) drug therapy: Secondary | ICD-10-CM | POA: Diagnosis not present

## 2024-05-18 DIAGNOSIS — Z888 Allergy status to other drugs, medicaments and biological substances status: Secondary | ICD-10-CM | POA: Diagnosis not present

## 2024-05-18 DIAGNOSIS — Z90710 Acquired absence of both cervix and uterus: Secondary | ICD-10-CM | POA: Diagnosis not present

## 2024-05-18 DIAGNOSIS — Z8701 Personal history of pneumonia (recurrent): Secondary | ICD-10-CM

## 2024-05-18 DIAGNOSIS — E66812 Obesity, class 2: Secondary | ICD-10-CM | POA: Diagnosis present

## 2024-05-18 DIAGNOSIS — E876 Hypokalemia: Secondary | ICD-10-CM | POA: Diagnosis present

## 2024-05-18 DIAGNOSIS — R0602 Shortness of breath: Secondary | ICD-10-CM | POA: Diagnosis not present

## 2024-05-18 LAB — CBC WITH DIFFERENTIAL/PLATELET
Abs Immature Granulocytes: 0.07 K/uL (ref 0.00–0.07)
Basophils Absolute: 0 K/uL (ref 0.0–0.1)
Basophils Relative: 0 %
Eosinophils Absolute: 0 K/uL (ref 0.0–0.5)
Eosinophils Relative: 0 %
HCT: 45.1 % (ref 36.0–46.0)
Hemoglobin: 14.2 g/dL (ref 12.0–15.0)
Immature Granulocytes: 1 %
Lymphocytes Relative: 25 %
Lymphs Abs: 3.7 K/uL (ref 0.7–4.0)
MCH: 29 pg (ref 26.0–34.0)
MCHC: 31.5 g/dL (ref 30.0–36.0)
MCV: 92 fL (ref 80.0–100.0)
Monocytes Absolute: 1.3 K/uL — ABNORMAL HIGH (ref 0.1–1.0)
Monocytes Relative: 9 %
Neutro Abs: 9.6 K/uL — ABNORMAL HIGH (ref 1.7–7.7)
Neutrophils Relative %: 65 %
Platelets: 293 K/uL (ref 150–400)
RBC: 4.9 MIL/uL (ref 3.87–5.11)
RDW: 13.9 % (ref 11.5–15.5)
WBC: 14.7 K/uL — ABNORMAL HIGH (ref 4.0–10.5)
nRBC: 0 % (ref 0.0–0.2)

## 2024-05-18 LAB — BASIC METABOLIC PANEL WITH GFR
Anion gap: 18 — ABNORMAL HIGH (ref 5–15)
BUN: 15 mg/dL (ref 8–23)
CO2: 23 mmol/L (ref 22–32)
Calcium: 8.4 mg/dL — ABNORMAL LOW (ref 8.9–10.3)
Chloride: 95 mmol/L — ABNORMAL LOW (ref 98–111)
Creatinine, Ser: 1.21 mg/dL — ABNORMAL HIGH (ref 0.44–1.00)
GFR, Estimated: 49 mL/min — ABNORMAL LOW (ref >60)
Glucose, Bld: 249 mg/dL — ABNORMAL HIGH (ref 70–99)
Potassium: 3.4 mmol/L — ABNORMAL LOW (ref 3.5–5.1)
Sodium: 136 mmol/L (ref 135–145)

## 2024-05-18 LAB — COMPREHENSIVE METABOLIC PANEL WITH GFR
ALT: 11 U/L (ref 0–44)
AST: 17 U/L (ref 15–41)
Albumin: 3.4 g/dL — ABNORMAL LOW (ref 3.5–5.0)
Alkaline Phosphatase: 79 U/L (ref 38–126)
Anion gap: 13 (ref 5–15)
BUN: 16 mg/dL (ref 8–23)
CO2: 29 mmol/L (ref 22–32)
Calcium: 8.6 mg/dL — ABNORMAL LOW (ref 8.9–10.3)
Chloride: 93 mmol/L — ABNORMAL LOW (ref 98–111)
Creatinine, Ser: 1.07 mg/dL — ABNORMAL HIGH (ref 0.44–1.00)
GFR, Estimated: 57 mL/min — ABNORMAL LOW (ref >60)
Glucose, Bld: 162 mg/dL — ABNORMAL HIGH (ref 70–99)
Potassium: 3.1 mmol/L — ABNORMAL LOW (ref 3.5–5.1)
Sodium: 135 mmol/L (ref 135–145)
Total Bilirubin: 0.7 mg/dL (ref 0.0–1.2)
Total Protein: 6.6 g/dL (ref 6.5–8.1)

## 2024-05-18 LAB — RESPIRATORY PANEL BY PCR

## 2024-05-18 LAB — BLOOD GAS, ARTERIAL
Acid-Base Excess: 6 mmol/L — ABNORMAL HIGH (ref 0.0–2.0)
Bicarbonate: 33 mmol/L — ABNORMAL HIGH (ref 20.0–28.0)
Drawn by: 724181
O2 Saturation: 89.5 %
Patient temperature: 37
pCO2 arterial: 57 mmHg — ABNORMAL HIGH (ref 32–48)
pH, Arterial: 7.37 (ref 7.35–7.45)
pO2, Arterial: 57 mmHg — ABNORMAL LOW (ref 83–108)

## 2024-05-18 LAB — HIV ANTIBODY (ROUTINE TESTING W REFLEX): HIV Screen 4th Generation wRfx: NONREACTIVE

## 2024-05-18 LAB — EXPECTORATED SPUTUM ASSESSMENT W GRAM STAIN, RFLX TO RESP C

## 2024-05-18 LAB — CBC
HCT: 42.8 % (ref 36.0–46.0)
Hemoglobin: 13.3 g/dL (ref 12.0–15.0)
MCH: 28.5 pg (ref 26.0–34.0)
MCHC: 31.1 g/dL (ref 30.0–36.0)
MCV: 91.6 fL (ref 80.0–100.0)
Platelets: 280 K/uL (ref 150–400)
RBC: 4.67 MIL/uL (ref 3.87–5.11)
RDW: 13.9 % (ref 11.5–15.5)
WBC: 13.4 K/uL — ABNORMAL HIGH (ref 4.0–10.5)
nRBC: 0 % (ref 0.0–0.2)

## 2024-05-18 LAB — RESP PANEL BY RT-PCR (RSV, FLU A&B, COVID)  RVPGX2
Influenza A by PCR: NEGATIVE
Influenza B by PCR: NEGATIVE
Resp Syncytial Virus by PCR: NEGATIVE
SARS Coronavirus 2 by RT PCR: NEGATIVE

## 2024-05-18 LAB — BRAIN NATRIURETIC PEPTIDE: B Natriuretic Peptide: 115.1 pg/mL — ABNORMAL HIGH (ref 0.0–100.0)

## 2024-05-18 LAB — I-STAT CG4 LACTIC ACID, ED
Lactic Acid, Venous: 1 mmol/L (ref 0.5–1.9)
Lactic Acid, Venous: 2.1 mmol/L (ref 0.5–1.9)

## 2024-05-18 MED ORDER — GUAIFENESIN 100 MG/5ML PO LIQD: 5.0000 mL | ORAL | Status: DC | PRN

## 2024-05-18 MED ORDER — ESCITALOPRAM OXALATE 10 MG PO TABS
10.0000 mg | ORAL_TABLET | Freq: Every day | ORAL | Status: DC
  Administered 2024-05-18 – 2024-05-20 (×3): 10 mg via ORAL
  Filled 2024-05-18 (×4): qty 1

## 2024-05-18 MED ORDER — ARFORMOTEROL TARTRATE 15 MCG/2ML IN NEBU
15.0000 ug | INHALATION_SOLUTION | Freq: Two times a day (BID) | RESPIRATORY_TRACT | Status: DC
  Administered 2024-05-18 – 2024-05-19 (×3): 15 ug via RESPIRATORY_TRACT
  Filled 2024-05-18 (×3): qty 2

## 2024-05-18 MED ORDER — HYDROCHLOROTHIAZIDE 25 MG PO TABS
25.0000 mg | ORAL_TABLET | Freq: Every morning | ORAL | Status: DC
  Administered 2024-05-18 – 2024-05-21 (×4): 25 mg via ORAL
  Filled 2024-05-18 (×4): qty 1

## 2024-05-18 MED ORDER — ACETAMINOPHEN 325 MG PO TABS
650.0000 mg | ORAL_TABLET | Freq: Four times a day (QID) | ORAL | Status: DC | PRN
  Administered 2024-05-20: 650 mg via ORAL
  Filled 2024-05-18: qty 2

## 2024-05-18 MED ORDER — IPRATROPIUM-ALBUTEROL 0.5-2.5 (3) MG/3ML IN SOLN
3.0000 mL | RESPIRATORY_TRACT | Status: DC | PRN
  Administered 2024-05-18: 3 mL via RESPIRATORY_TRACT
  Filled 2024-05-18: qty 3

## 2024-05-18 MED ORDER — GUAIFENESIN 100 MG/5ML PO LIQD
10.0000 mL | ORAL | Status: DC | PRN
  Administered 2024-05-18 – 2024-05-20 (×3): 10 mL via ORAL
  Filled 2024-05-18 (×3): qty 10

## 2024-05-18 MED ORDER — POLYETHYLENE GLYCOL 3350 17 G PO PACK
17.0000 g | PACK | Freq: Every day | ORAL | Status: DC | PRN
Start: 2024-05-18 — End: 2024-05-21

## 2024-05-18 MED ORDER — ALBUTEROL SULFATE (2.5 MG/3ML) 0.083% IN NEBU
10.0000 mg | INHALATION_SOLUTION | Freq: Once | RESPIRATORY_TRACT | Status: AC
  Administered 2024-05-18: 10 mg via RESPIRATORY_TRACT
  Filled 2024-05-18: qty 12

## 2024-05-18 MED ORDER — SODIUM CHLORIDE 0.9% FLUSH
3.0000 mL | Freq: Two times a day (BID) | INTRAVENOUS | Status: DC
  Administered 2024-05-18 – 2024-05-21 (×7): 3 mL via INTRAVENOUS

## 2024-05-18 MED ORDER — PROCHLORPERAZINE EDISYLATE 10 MG/2ML IJ SOLN: 5.0000 mg | Freq: Four times a day (QID) | INTRAMUSCULAR | Status: DC | PRN

## 2024-05-18 MED ORDER — SODIUM CHLORIDE 0.9 % IV SOLN
1.0000 g | INTRAVENOUS | Status: DC
  Administered 2024-05-18 – 2024-05-21 (×4): 1 g via INTRAVENOUS
  Filled 2024-05-18 (×4): qty 10

## 2024-05-18 MED ORDER — ACETAMINOPHEN 650 MG RE SUPP: 650.0000 mg | Freq: Four times a day (QID) | RECTAL | Status: DC | PRN

## 2024-05-18 MED ORDER — POTASSIUM CHLORIDE CRYS ER 20 MEQ PO TBCR
40.0000 meq | EXTENDED_RELEASE_TABLET | Freq: Once | ORAL | Status: AC
  Administered 2024-05-18: 40 meq via ORAL
  Filled 2024-05-18: qty 2

## 2024-05-18 MED ORDER — IPRATROPIUM-ALBUTEROL 0.5-2.5 (3) MG/3ML IN SOLN
3.0000 mL | Freq: Four times a day (QID) | RESPIRATORY_TRACT | Status: DC
  Administered 2024-05-18 – 2024-05-20 (×9): 3 mL via RESPIRATORY_TRACT
  Filled 2024-05-18 (×13): qty 3

## 2024-05-18 MED ORDER — METHYLPREDNISOLONE SODIUM SUCC 40 MG IJ SOLR
40.0000 mg | Freq: Two times a day (BID) | INTRAMUSCULAR | Status: AC
  Administered 2024-05-18: 40 mg via INTRAVENOUS
  Filled 2024-05-18: qty 1

## 2024-05-18 MED ORDER — METHYLPREDNISOLONE SODIUM SUCC 125 MG IJ SOLR
125.0000 mg | Freq: Two times a day (BID) | INTRAMUSCULAR | Status: DC
  Administered 2024-05-18: 125 mg via INTRAVENOUS
  Filled 2024-05-18: qty 2

## 2024-05-18 MED ORDER — ENOXAPARIN SODIUM 40 MG/0.4ML IJ SOSY
40.0000 mg | PREFILLED_SYRINGE | Freq: Every day | INTRAMUSCULAR | Status: DC
  Administered 2024-05-18 – 2024-05-21 (×4): 40 mg via SUBCUTANEOUS
  Filled 2024-05-18 (×4): qty 0.4

## 2024-05-18 MED ORDER — ASPIRIN 81 MG PO CHEW
81.0000 mg | CHEWABLE_TABLET | Freq: Every morning | ORAL | Status: DC
Start: 2024-05-18 — End: 2024-05-21
  Administered 2024-05-18 – 2024-05-21 (×4): 81 mg via ORAL
  Filled 2024-05-18 (×4): qty 1

## 2024-05-18 MED ORDER — ALBUTEROL SULFATE (2.5 MG/3ML) 0.083% IN NEBU: 2.5000 mg | INHALATION_SOLUTION | RESPIRATORY_TRACT | Status: DC | PRN

## 2024-05-18 MED ORDER — ALBUTEROL SULFATE (2.5 MG/3ML) 0.083% IN NEBU
INHALATION_SOLUTION | RESPIRATORY_TRACT | Status: AC
  Filled 2024-05-18: qty 6

## 2024-05-18 MED ORDER — BUDESONIDE 0.5 MG/2ML IN SUSP
0.5000 mg | Freq: Two times a day (BID) | RESPIRATORY_TRACT | Status: DC
  Administered 2024-05-18 – 2024-05-20 (×6): 0.5 mg via RESPIRATORY_TRACT
  Filled 2024-05-18 (×7): qty 2

## 2024-05-18 MED ORDER — ALUM & MAG HYDROXIDE-SIMETH 200-200-20 MG/5ML PO SUSP
30.0000 mL | ORAL | Status: DC | PRN
  Administered 2024-05-18 – 2024-05-20 (×2): 30 mL via ORAL
  Filled 2024-05-18 (×2): qty 30

## 2024-05-18 MED ORDER — PREDNISONE 20 MG PO TABS: 40.0000 mg | ORAL_TABLET | Freq: Every day | ORAL | Status: DC

## 2024-05-18 MED ORDER — NICOTINE 14 MG/24HR TD PT24
14.0000 mg | MEDICATED_PATCH | Freq: Every day | TRANSDERMAL | Status: DC
  Administered 2024-05-18 – 2024-05-21 (×4): 14 mg via TRANSDERMAL
  Filled 2024-05-18 (×4): qty 1

## 2024-05-18 MED ORDER — DOXYCYCLINE HYCLATE 100 MG PO TABS
100.0000 mg | ORAL_TABLET | Freq: Two times a day (BID) | ORAL | Status: DC
  Administered 2024-05-18 – 2024-05-21 (×7): 100 mg via ORAL
  Filled 2024-05-18 (×8): qty 1

## 2024-05-18 NOTE — ED Provider Notes (Signed)
 [place] EMERGENCY DEPARTMENT AT [facility] Provider Note   CSN: [id number] Arrival date & time: 05/18/24  0020     Patient presents with: Shortness of Breath   [name] is a 68 y.o. female.  Patient with past medical history significant for COPD, dyspnea on exertion, hypertension, recent diagnosis of community-acquired pneumonia presents to the Emergency Department complaining of worsening shortness of breath.  The patient states that she was diagnosed with pneumonia 3 weeks ago.  She reports a productive cough since diagnosis.  She has completed 2 different courses of antibiotics and also completed a steroid Dosepak.  She has not missed any medications.  She states that she had a short improvement in symptoms but today felt significantly short of breath.  Her oxygen  at home was in the 53s.  EMS placed her on 15 L/min with an improvement to 93%.  She reportedly called EMS for shortness of breath this morning at 10 AM and declined transport but called again this evening for transport to the emergency department.  She reports taking 3 breathing treatments at home over 2 hours.  EMS administered 125 mg of Solu-Medrol  and 2 g of magnesium .  She denies nausea, vomiting, abdominal pain, chest pain, headache, fever.    Shortness of Breath      Prior to Admission medications   Medication Sig Start Date End Date Taking? Authorizing Provider  albuterol  (PROVENTIL  HFA;VENTOLIN  HFA) 108 (90 BASE) MCG/ACT inhaler Inhale 2 puffs into the lungs every 6 (six) hours as needed for wheezing or shortness of breath.   Yes [provider]  albuterol  (PROVENTIL ) (5 MG/ML) 0.5% nebulizer solution Take 0.5 mLs (2.5 mg total) by nebulization every 4 (four) hours while awake for 3 days, THEN 0.5 mLs (2.5 mg total) every 4 (four) hours as needed for wheezing or shortness of breath. Patient taking differently: Inhale 0.5 mLs (2.5 mg total) every 4 (four) hours as needed for wheezing or  shortness of breath. 07/21/21 09/10/24 Yes Briana Elgin LABOR, MD  aspirin  81 MG chewable tablet Chew 81 mg by mouth every morning.   Yes [provider]  azithromycin  (ZITHROMAX ) 500 MG tablet Take 500 mg by mouth daily. 05/16/24  Yes [provider]  Calcium -Phosphorus-Vitamin D (CALCIUM /VITAMIN D3/ADULT GUMMY PO) Take 2 each by mouth every morning.   Yes [provider]  escitalopram  (LEXAPRO ) 10 MG tablet Take 10 mg by mouth at bedtime. 09/18/18  Yes [provider]  fluticasone  (FLONASE ) 50 MCG/ACT nasal spray Place 2 sprays into both nostrils daily as needed for allergies or rhinitis.   Yes [provider]  hydrochlorothiazide  (HYDRODIURIL ) 25 MG tablet Take 25 mg by mouth every morning. 09/03/15  Yes [provider]  ibuprofen (ADVIL) 800 MG tablet Take 800 mg by mouth every 8 (eight) hours as needed for moderate pain. 06/27/21  Yes [provider]  levocetirizine (XYZAL) 5 MG tablet Take 5 mg by mouth daily. 05/09/24  Yes [provider]  Multiple Vitamins-Minerals (ADULT ONE DAILY GUMMIES) CHEW Chew 2 each by mouth every morning.   Yes [provider]  omeprazole (PRILOSEC) 40 MG capsule Take 40 mg by mouth 2 (two) times daily as needed (heartburn, indigestion). 12/24/23  Yes [provider]  OXYGEN  Inhale 3 L into the lungs 2 (two) times daily as needed (Respiratory symptoms).   Yes [provider]  SYMBICORT  80-4.5 MCG/ACT inhaler Inhale 2 puffs into the lungs 2 (two) times daily as needed (Respiratory symptoms). 04/07/24  Yes [provider]    Allergies: Trazodone  and nefazodone    Review of Systems  Respiratory:  Positive for shortness of breath.     Updated Vital Signs BP 119/64   Pulse 86   Temp 98.4 F (36.9 C) (Oral)   Resp 19   SpO2 (!) 89%   Physical Exam Vitals and nursing note reviewed.  Constitutional:      General: She is not in acute distress.    Appearance: She  is well-developed.  HENT:     Head: Normocephalic and atraumatic.  Eyes:     Conjunctiva/sclera: Conjunctivae normal.  Cardiovascular:     Rate and Rhythm: Normal rate and regular rhythm.  Pulmonary:     Effort: Pulmonary effort is normal. No respiratory distress.     Breath sounds: Examination of the right-lower field reveals decreased breath sounds. Decreased breath sounds and wheezing present.  Chest:     Chest wall: No tenderness.  Abdominal:     Palpations: Abdomen is soft.     Tenderness: There is no abdominal tenderness.  Musculoskeletal:        General: No swelling.     Cervical back: Neck supple.     Right lower leg: No edema.     Left lower leg: No edema.  Skin:    General: Skin is warm and dry.     Capillary Refill: Capillary refill takes less than 2 seconds.  Neurological:     Mental Status: She is alert.  Psychiatric:        Mood and Affect: Mood normal.     (all labs ordered are listed, but only abnormal results are displayed) Labs Reviewed  COMPREHENSIVE METABOLIC PANEL WITH GFR - Abnormal; Notable for the following components:      Result Value   Potassium 3.1 (*)    Chloride 93 (*)    Glucose, Bld 162 (*)    Creatinine, Ser 1.07 (*)    Calcium  8.6 (*)    Albumin 3.4 (*)    GFR, Estimated 57 (*)    All other components within normal limits  CBC WITH DIFFERENTIAL/PLATELET - Abnormal; Notable for the following components:   WBC 14.7 (*)    Neutro Abs 9.6 (*)    Monocytes Absolute 1.3 (*)    All other components within normal limits  BRAIN NATRIURETIC PEPTIDE - Abnormal; Notable for the following components:   B Natriuretic Peptide 115.1 (*)    All other components within normal limits  I-STAT CG4 LACTIC ACID, ED - Abnormal; Notable for the following components:   Lactic Acid, Venous 2.1 (*)    All other components within normal limits  RESP PANEL BY RT-PCR (RSV, FLU A&B, COVID)  RVPGX2  CULTURE, BLOOD (ROUTINE X 2)  CULTURE, BLOOD (ROUTINE X 2)   EXPECTORATED SPUTUM ASSESSMENT W GRAM STAIN, RFLX TO RESP C  HIV ANTIBODY (ROUTINE TESTING W REFLEX)  BASIC METABOLIC PANEL WITH GFR  CBC  I-STAT CG4 LACTIC ACID, ED    EKG: EKG Interpretation Date/Time:  Sunday May 18 2024 00:28:44 EDT Ventricular Rate:  101 PR Interval:  152 QRS Duration:  81 QT Interval:  371 QTC Calculation: 481 R Axis:   102  Text Interpretation: Sinus tachycardia Paired ventricular premature complexes Aberrant conduction of SV complex(es) Right atrial enlargement Right axis deviation Confirmed by Trine Likes [phone]) on 05/18/2024 1:11:25 AM  Radiology: [facility] Chest Portable 1 View Result Date: 05/18/2024 CLINICAL DATA:  Shortness of breath EXAM: PORTABLE CHEST 1 VIEW  COMPARISON:  01/13/2022 FINDINGS: Mild cardiomegaly, vascular congestion. No overt edema, confluent opacities or effusions. No acute bony abnormality. IMPRESSION: Cardiomegaly, vascular congestion. Electronically Signed   By: Franky Crease M.D.   On: 05/18/2024 00:54     .Critical Care  Performed by: Logan Ubaldo NOVAK, PA-C Authorized by: Logan Ubaldo NOVAK, PA-C   Critical care provider statement:    Critical care time (minutes):  30   Critical care time was exclusive of:  Separately billable procedures and treating other patients   Critical care was necessary to treat or prevent imminent or life-threatening deterioration of the following conditions:  Respiratory failure   Critical care was time spent personally by me on the following activities:  Development of treatment plan with patient or surrogate, discussions with consultants, evaluation of patient's response to treatment, examination of patient, ordering and review of laboratory studies, ordering and review of radiographic studies, ordering and performing treatments and interventions, pulse oximetry, re-evaluation of patient's condition and review of old charts   Care discussed with: admitting provider      Medications Ordered in the  ED  aspirin  chewable tablet 81 mg (has no administration in time range)  hydrochlorothiazide  (HYDRODIURIL ) tablet 25 mg (has no administration in time range)  escitalopram  (LEXAPRO ) tablet 10 mg (has no administration in time range)  enoxaparin  (LOVENOX ) injection 40 mg (has no administration in time range)  cefTRIAXone  (ROCEPHIN ) 1 g in sodium chloride  0.9 % 100 mL IVPB (has no administration in time range)  methylPREDNISolone  sodium succinate (SOLU-MEDROL ) 125 mg/2 mL injection 125 mg (has no administration in time range)    Followed by  predniSONE  (DELTASONE ) tablet 40 mg (has no administration in time range)  ipratropium-albuterol  (DUONEB) 0.5-2.5 (3) MG/3ML nebulizer solution 3 mL (has no administration in time range)  sodium chloride  flush (NS) 0.9 % injection 3 mL (has no administration in time range)  potassium chloride  SA (KLOR-CON  M) CR tablet 40 mEq (has no administration in time range)  acetaminophen  (TYLENOL ) tablet 650 mg (has no administration in time range)    Or  acetaminophen  (TYLENOL ) suppository 650 mg (has no administration in time range)  polyethylene glycol (MIRALAX  / GLYCOLAX ) packet 17 g (has no administration in time range)  prochlorperazine  (COMPAZINE ) injection 5 mg (has no administration in time range)  albuterol  (PROVENTIL ) (2.5 MG/3ML) 0.083% nebulizer solution 10 mg (10 mg Nebulization Given 05/18/24 0120)                                    Medical Decision Making Amount and/or Complexity of Data Reviewed Labs: ordered. Radiology: ordered.  Risk Prescription drug management. Decision regarding hospitalization.   This patient presents to the ED for concern of dyspnea, this involves an extensive number of treatment options, and is a complaint that carries with it a high risk of complications and morbidity.  The differential diagnosis includes COPD exacerbation, acute heart failure, pneumonia, others   Co morbidities / Chronic conditions that complicate  the patient evaluation  COPD, recent ammonia   Additional history obtained:  Additional history obtained from EMR   Lab Tests:  I Ordered, and personally interpreted labs.  The pertinent results include: White count of 14,700, BNP 115, initial lactic acid 2.1 improved to 1.0, CMP showing mild hypokalemia with a potassium of 3.1   Imaging Studies ordered:  I ordered imaging studies including chest x-ray I independently visualized and interpreted imaging which showed  cardiomegaly and vascular congestion without overt pulmonary edema I agree with the radiologist interpretation   Cardiac Monitoring: / EKG:  The patient was maintained on a cardiac monitor.  I personally viewed and interpreted the cardiac monitored which showed an underlying rhythm of: Sinus tachycardia   Problem List / ED Course / Critical interventions / Medication management   I ordered medication including continuous albuterol  Reevaluation of the patient after these medicines showed that the patient had some improvement but continued to have oxygen  desaturations  Consultations Obtained:  I requested consultation with the hospitalist, Dr. Charlton and discussed lab and imaging findings as well as pertinent plan - they recommend: Admission   Social Determinants of Health:  Patient is an occasional smoker   Test / Admission - Considered:  Patient with wide spread wheezing consistent with COPD exacerbation.  She was recently treated with antibiotics for pneumonia.  No obvious pneumonia on imaging.  Patient uses 3 L/min of oxygen  at home but is currently requiring 6 L and continuous nebulized albuterol  to maintain oxygen  saturations of 90+ percent.  She was treated with by EMS with Solu-Medrol  and magnesium .  Patient needs admission to the hospital for further management.      Final diagnoses:  COPD exacerbation [facility])    ED Discharge Orders     None          Logan Ubaldo KATHEE DEVONNA 05/18/24  0324    Trine Raynell Moder, MD 05/18/24 [phone]

## 2024-05-18 NOTE — ED Triage Notes (Signed)
 Pt was Dx with PNA 3 weeks ago, she has had a productive cough since then, she has been prescribed 2 different Abx and a steroid pack and has completed all treatment. She did have a low Spo2 at home but went up on her O2. She called out around 10am fro medic and then denied transport and she called a second time and they transported her here. She has had x3 breathing treatments over 2 hrs, 125mg  solumedrol, 2g mag. No chest pain, syncope or dizziness.  Medic vitals   124/84 98hr 89% 3l baseline normal 23rr  20g lrft wrist

## 2024-05-18 NOTE — Progress Notes (Signed)
 Venous duplex lower extremities completed. Refer to [facility] under chart review to view preliminary results.   05/18/2024  12:12 PM Carita Sollars, Ricka BIRCH

## 2024-05-18 NOTE — H&P (Signed)
 History and Physical    [name] FMW:[id number] DOB: 08-Mar-1956 DOA: 05/18/2024  PCP: Kim Rosaline SQUIBB, NP   Patient coming from: Home   Chief Complaint: Cough, SOB   HPI: [name] is a 68 y.o. female with medical history significant for hypertension, depression, anxiety, COPD, and chronic hypoxic respiratory failure who presents with productive cough and worsening shortness of breath.  Patient states that she was diagnosed with pneumonia roughly 3 weeks ago and treated with antibiotics.  She has had persistent symptoms and continues to worsen despite 2 different antibiotics and a course of steroids.  She has ongoing productive cough and has now become dyspneic even while at rest.  She was using bronchodilators at home, stating that it typically relieves the symptoms, but she was not able to get any relief tonight and called EMS.  EMS found the patient to be hypoxic and in respiratory distress.  She was placed on 15 L/min of supplemental oxygen  and given given 125 mg IV Solu-Medrol , IV magnesium , and 3 nebulized breathing treatments prior to arrival in the ED.  ED Course: Upon arrival to the ED, patient is found to be afebrile and saturating mid 80s to mid 90s on 6 L/min supplemental oxygen  with tachypnea, mild tachycardia, and stable blood pressure.  Labs are most notable for potassium 3.1, creatinine 1.07, WBC 14,700, lactic acid 2.1, and BNP 115.  COVID, influenza, and RSV PCR is negative.  Chest x-ray notable for cardiomegaly and vascular congestion.  Blood cultures were collected in the ED and she was given 10 mg continuous albuterol  treatment.  Review of Systems:  All other systems reviewed and apart from HPI, are negative.  Past Medical History:  Diagnosis Date   COPD with asthma (HCC)    currently followed by pcp (previous as seen pulmonogist --- dr byrum lov note in epic 10-04-2017),  last exacerbation with pneumonia 04/ 2022 pt stated completed antibiotic , no  residual)   DOE (dyspnea on exertion)    02-15-2021  per pt only with hills/ stairs , no issue with other activities   History of hyperthyroidism    s/p  RAI 12/ 2013,  followed by pcp   Hypertension    Nocturnal hypoxemia    uses 2L oxygen  at night only,  (02-15-2021 pt stated O2 sat can get down 70--88%,  stated during the day O2 sat on RA are normal)   Nocturnal leg cramps    occasional   RLS (restless legs syndrome)    Wears dentures    upper    Past Surgical History:  Procedure Laterality Date   ABDOMINAL HYSTERECTOMY  1989   WITH UNILATERAL SALPINGOOPHORECTOMY AND APPENDECTOMY   ADJUSTABLE SUTURE MANIPULATION Right 02/16/2021   Procedure: RIGHT MEDIAL RECTUS ADJUSTABLE SUTURE MANIPULATION;  Surgeon: Jacques Sharper, MD;  Location: [facility];  Service: Ophthalmology;  Laterality: Right;   MEDIAN RECTUS REPAIR Right 02/16/2021   Procedure: RIGHT LATERAL RECTUS RECESSION;  Surgeon: Jacques Sharper, MD;  Location: [facility];  Service: Ophthalmology;  Laterality: Right;   MUSCLE RECESSION AND RESECTION Right 02/16/2021   Procedure: RIGHT MEDIAL RECTUS RESECTION;  Surgeon: Jacques Sharper, MD;  Location: [facility];  Service: Ophthalmology;  Laterality: Right;   STRABISMUS SURGERY Left 02/16/2021   Procedure: LEFT INFERIOR OBLIQUE RESECTION;  Surgeon: Jacques Sharper, MD;  Location: [facility];  Service: Ophthalmology;  Laterality: Left;    Social History:   reports that she has been smoking cigarettes. She has  never used smokeless tobacco. She reports that she does not drink alcohol and does not use drugs.  Allergies  Allergen Reactions   Trazodone  And Nefazodone Other (See Comments)    Suicidal thoughts, hallucinations.    Family History  Problem Relation Age of Onset   Kidney failure Mother    Heart failure Mother    Heart attack Father    Heart attack Brother      Prior to Admission medications    Medication Sig Start Date End Date Taking? Authorizing Provider  albuterol  (PROVENTIL  HFA;VENTOLIN  HFA) 108 (90 BASE) MCG/ACT inhaler Inhale 2 puffs into the lungs every 6 (six) hours as needed for wheezing or shortness of breath.   Yes [provider]  albuterol  (PROVENTIL ) (5 MG/ML) 0.5% nebulizer solution Take 0.5 mLs (2.5 mg total) by nebulization every 4 (four) hours while awake for 3 days, THEN 0.5 mLs (2.5 mg total) every 4 (four) hours as needed for wheezing or shortness of breath. Patient taking differently: Inhale 0.5 mLs (2.5 mg total) every 4 (four) hours as needed for wheezing or shortness of breath. 07/21/21 09/10/24 Yes Briana Elgin LABOR, MD  aspirin  81 MG chewable tablet Chew 81 mg by mouth every morning.   Yes [provider]  azithromycin  (ZITHROMAX ) 500 MG tablet Take 500 mg by mouth daily. 05/16/24  Yes [provider]  Calcium -Phosphorus-Vitamin D (CALCIUM /VITAMIN D3/ADULT GUMMY PO) Take 2 each by mouth every morning.   Yes [provider]  escitalopram  (LEXAPRO ) 10 MG tablet Take 10 mg by mouth at bedtime. 09/18/18  Yes [provider]  fluticasone  (FLONASE ) 50 MCG/ACT nasal spray Place 2 sprays into both nostrils daily as needed for allergies or rhinitis.   Yes [provider]  hydrochlorothiazide  (HYDRODIURIL ) 25 MG tablet Take 25 mg by mouth every morning. 09/03/15  Yes [provider]  ibuprofen (ADVIL) 800 MG tablet Take 800 mg by mouth every 8 (eight) hours as needed for moderate pain. 06/27/21  Yes [provider]  levocetirizine (XYZAL) 5 MG tablet Take 5 mg by mouth daily. 05/09/24  Yes [provider]  Multiple Vitamins-Minerals (ADULT ONE DAILY GUMMIES) CHEW Chew 2 each by mouth every morning.   Yes [provider]  omeprazole (PRILOSEC) 40 MG capsule Take 40 mg by mouth 2 (two) times daily as needed (heartburn, indigestion). 12/24/23  Yes [provider]  OXYGEN  Inhale 3 L into  the lungs 2 (two) times daily as needed (Respiratory symptoms).   Yes [provider]  SYMBICORT  80-4.5 MCG/ACT inhaler Inhale 2 puffs into the lungs 2 (two) times daily as needed (Respiratory symptoms). 04/07/24  Yes [provider]    Physical Exam: Vitals:   05/18/24 0254 05/18/24 0255 05/18/24 0256 05/18/24 0257  BP:      Pulse: 90 95 (!) 101 86  Resp: (!) 23 (!) 24 (!) 23 19  Temp:      TempSrc:      SpO2: (!) 86% (!) 88% (!) 87% (!) 89%    Constitutional: NAD, no pallor or diaphoresis   Eyes: PERTLA, lids and conjunctivae normal ENMT: Mucous membranes are moist. Posterior pharynx clear of any exudate or lesions.   Neck: supple, no masses  Respiratory: Labored respirations, tachypnea, dyspnea with speech. Decreased breath sounds bilaterally with prolonged expiratory phase.  Cardiovascular: S1 & S2 heard, regular rate and rhythm. No extremity edema.  Abdomen: No tenderness, soft. Bowel sounds active.  Musculoskeletal: no clubbing / cyanosis. No joint deformity upper and lower  extremities.   Skin: no significant rashes, lesions, ulcers. Warm, dry, well-perfused. Neurologic: CN 2-12 grossly intact. Moving all extremities. Alert and oriented.  Psychiatric: Pleasant. Cooperative.    Labs and Imaging on Admission: I have personally reviewed following labs and imaging studies  CBC: Recent Labs  Lab 05/18/24 0030  WBC 14.7*  NEUTROABS 9.6*  HGB 14.2  HCT 45.1  MCV 92.0  PLT 293   Basic Metabolic Panel: Recent Labs  Lab 05/18/24 0030  NA 135  K 3.1*  CL 93*  CO2 29  GLUCOSE 162*  BUN 16  CREATININE 1.07*  CALCIUM  8.6*   GFR: CrCl cannot be calculated (Unknown ideal weight.). Liver Function Tests: Recent Labs  Lab 05/18/24 0030  AST 17  ALT 11  ALKPHOS 79  BILITOT 0.7  PROT 6.6  ALBUMIN 3.4*   No results for input(s): LIPASE, AMYLASE in the last 168 hours. No results for input(s): AMMONIA in the last 168 hours. Coagulation  Profile: No results for input(s): INR, PROTIME in the last 168 hours. Cardiac Enzymes: No results for input(s): CKTOTAL, CKMB, CKMBINDEX, TROPONINI in the last 168 hours. BNP (last 3 results) No results for input(s): PROBNP in the last 8760 hours. HbA1C: No results for input(s): HGBA1C in the last 72 hours. CBG: No results for input(s): GLUCAP in the last 168 hours. Lipid Profile: No results for input(s): CHOL, HDL, LDLCALC, TRIG, CHOLHDL, LDLDIRECT in the last 72 hours. Thyroid Function Tests: No results for input(s): TSH, T4TOTAL, FREET4, T3FREE, THYROIDAB in the last 72 hours. Anemia Panel: No results for input(s): VITAMINB12, FOLATE, FERRITIN, TIBC, IRON, RETICCTPCT in the last 72 hours. Urine analysis:    Component Value Date/Time   COLORURINE YELLOW 07/20/2021 2253   APPEARANCEUR HAZY (A) 07/20/2021 2253   LABSPEC 1.019 07/20/2021 2253   PHURINE 5.0 07/20/2021 2253   GLUCOSEU NEGATIVE 07/20/2021 2253   HGBUR NEGATIVE 07/20/2021 2253   HGBUR negative 03/25/2009 0943   BILIRUBINUR NEGATIVE 07/20/2021 2253   KETONESUR NEGATIVE 07/20/2021 2253   PROTEINUR NEGATIVE 07/20/2021 2253   UROBILINOGEN 0.2 03/09/2014 2122   NITRITE NEGATIVE 07/20/2021 2253   LEUKOCYTESUR NEGATIVE 07/20/2021 2253   Sepsis Labs: @LABRCNTIP (procalcitonin:4,lacticidven:4) ) Recent Results (from the past 240 hours)  Resp panel by RT-PCR (RSV, Flu A&B, Covid)     Status: None   Collection Time: 05/18/24 12:27 AM   Specimen: Nasal Swab  Result Value Ref Range Status   SARS Coronavirus 2 by RT PCR NEGATIVE NEGATIVE Final   Influenza A by PCR NEGATIVE NEGATIVE Final   Influenza B by PCR NEGATIVE NEGATIVE Final    Comment: (NOTE) The Xpert Xpress SARS-CoV-2/FLU/RSV plus assay is intended as an aid in the diagnosis of influenza from Nasopharyngeal swab specimens and should not be used as a sole basis for treatment. Nasal washings and aspirates are  unacceptable for Xpert Xpress SARS-CoV-2/FLU/RSV testing.  Fact Sheet for Patients: BloggerCourse.com  Fact Sheet for Healthcare Providers: SeriousBroker.it  This test is not yet approved or cleared by the [place]  FDA and has been authorized for detection and/or diagnosis of SARS-CoV-2 by FDA under an Emergency Use Authorization (EUA). This EUA will remain in effect (meaning this test can be used) for the duration of the COVID-19 declaration under Section 564(b)(1) of the Act, 21 U.S.C. section 360bbb-3(b)(1), unless the authorization is terminated or revoked.     Resp Syncytial Virus by PCR NEGATIVE NEGATIVE Final    Comment: (NOTE) Fact Sheet for Patients: BloggerCourse.com  Fact Sheet for Healthcare Providers:  SeriousBroker.it  This test is not yet approved or cleared by the [place]  FDA and has been authorized for detection and/or diagnosis of SARS-CoV-2 by FDA under an Emergency Use Authorization (EUA). This EUA will remain in effect (meaning this test can be used) for the duration of the COVID-19 declaration under Section 564(b)(1) of the Act, 21 U.S.C. section 360bbb-3(b)(1), unless the authorization is terminated or revoked.  Performed at [facility], [address] [place], [place] [address]      Radiological Exams on Admission: DG Chest Portable 1 View Result Date: 05/18/2024 CLINICAL DATA:  Shortness of breath EXAM: PORTABLE CHEST 1 VIEW COMPARISON:  01/13/2022 FINDINGS: Mild cardiomegaly, vascular congestion. No overt edema, confluent opacities or effusions. No acute bony abnormality. IMPRESSION: Cardiomegaly, vascular congestion. Electronically Signed   By: Franky Crease M.D.   On: 05/18/2024 00:54    EKG: Independently reviewed. Sinus tachycardia, rate 101, PVCs, RAD.   Assessment/Plan   1. COPD exacerbation; acute on chronic hypoxic  respiratory failure  - Culture sputum, continue systemic steroids, start antibiotic, continue short-acting bronchodilators, continue supplemental O2   2. Hypokalemia  - Replacing    3. Hypertension  - Continue hydrochlorothiazide     4. Depression, anxiety  - Continue Lexapro     DVT prophylaxis: Lovenox   Code Status: Full  Level of Care: Level of care: Progressive Family Communication: None present  Disposition Plan:  Patient is from: home  Anticipated d/c is to: Home  Anticipated d/c date is: 05/21/24  Patient currently: Pending improved respiratory status  Consults called: None  Admission status: Inpatient     Evalene GORMAN Sprinkles, MD [facility]  05/18/2024, 3:26 AM

## 2024-05-18 NOTE — Progress Notes (Signed)
 Present presented with worsening cough and shortness of breath and has been started on IV steroids along with nebs for COPD exacerbation.  Patient seen and examined at bedside.  I have reviewed patient's medical records including this morning H&P, current vitals, labs and medications myself.  Currently on 6 L high flow nasal cannula oxygen .  Will get ABG.  Add Pulmicort  nebs.  Schedule DuoNebs and add albuterol  as needed.  Will consult pulmonary.

## 2024-05-18 NOTE — Consult Note (Signed)
 NAME:  [name], MRN:  [id number], DOB:  05-25-1956, LOS: 0 ADMISSION DATE:  05/18/2024, CONSULTATION DATE:  05/18/24 REFERRING MD:  Dr. Cheryle, CHIEF COMPLAINT:  SOB   History of Present Illness:   31 yoF with PMH as below significant for ongoing tobacco abuse (up to 10 cigarettes/ day), COPD, chronic hypoxic respiratory failure, and HTN admitted overnight by [facility] for AECOPD.  Pt reports she has had worsening cough, chills, decreased appetite, greenish productive sputum, and needing her home oxygen  intermittently for the last 3 weeks.  Became sick again Friday and progressively SOB, noted her O2 sats in the 70's at home and called EMS.  Has been exposed to sick family member ~1 week ago and recently traveled to [place]  1 week ago by car.  Has completed 2 rounds of antibiotics of azithromycin  and steroids previously. Did feel better and cough improved after these treatments.  Denies any chest pain, dizziness, syncope, leg swelling or pain, or N/V.  Has home oxygen  in which she used to use daily, then nocturnal and now only as needed.  Has nebs at home but does not use any nebs/ inhalers on a daily use, only when sick or with allergies.  Reports 3 flare ups with her breathing since January but overall has been doing better over last several years with last hospitalization was 12/2021 for AECOPD.  Last seen in pulmonary clinic by Dr. Shelah 09/2017 and since managed by her PCP.    Initially requiring NRB with EMS since weaned down to 6L [place], found in ER to be afebrile, WBC 14.7, initial lactic 2.1> 1, BNP 115, COVID/ RSV/ flu neg, and CXR showing cardiomegaly and vascular congestion.  Treated with solumedrol, nebs, and started on ceftriaxone .  Reported ongoing dyspnea and O2 requirement still requiring 6L despite treatment.  Pt does report improvement with appetite this am.  ABG 7.37/ 57/ 57/ 33.  Remains hemodynamically stable.  Pulmonary consulted for further recommendations.    Pertinent  Medical History    Past Medical History:  Diagnosis Date   COPD with asthma (HCC)    currently followed by pcp (previous as seen pulmonogist --- dr byrum lov note in epic 10-04-2017),  last exacerbation with pneumonia 04/ 2022 pt stated completed antibiotic , no residual)   DOE (dyspnea on exertion)    02-15-2021  per pt only with hills/ stairs , no issue with other activities   History of hyperthyroidism    s/p  RAI 12/ 2013,  followed by pcp   Hypertension    Nocturnal hypoxemia    uses 2L oxygen  at night only,  (02-15-2021 pt stated O2 sat can get down 70--88%,  stated during the day O2 sat on RA are normal)   Nocturnal leg cramps    occasional   RLS (restless legs syndrome)    Wears dentures    upper   Significant Hospital Events: Including procedures, antibiotic start and stop dates in addition to other pertinent events     Interim History / Subjective:   Objective    Blood pressure (!) 141/91, pulse 72, temperature 98.1 F (36.7 C), temperature source Oral, resp. rate 17, SpO2 92%.       No intake or output data in the 24 hours ending 05/18/24 1121 There were no vitals filed for this visit.  Examination: General:  Pleasant and jovial older female sitting up on side of bed in NAD HEENT: MM pink/moist Neuro: AO, appropriate, MAE CV: rr, no murmur PULM:  non  labored, diffuse insp/ exp wheeze, diminished, scattered rhonchi, few posterior bibasilar rales, becomes dyspneic with coughing spells with clear mucous but otherwise no conversational dyspnea Extremities: warm/dry, no pitting tibial edema, no calf tenderness   Resolved problem list   Assessment and Plan   Acute on chronic hypoxic and hypercarbic respiratory failure secondary to  AECOPD Tobacco abuse - ok to remain in progressive, no ICU needs - WOB currently w/coughing spells, consider BiPAP if progressive.  Bicarb suggest chronic component and mental status is at baseline - cont to wean supplemental O2 for sat 88-94% -  cont duonebs, pulmicort , add brovana , prn albuterol  - cont abx with ctx and doxycycline , pending PCT - cont solumedrol at 40mg  q12hrs - check RVP - check echo given BNP 115 and hx HTN - low suspicion of PE as no CP or LE pain/ swelling given recent car trip but given O2 needs, check LE dopplers given recent trip for completeness  - tobacco cessation counseling as appropriate   PCCM will see again 9/8.  Remainder per [facility].   Labs   CBC: Recent Labs  Lab 05/18/24 0030 05/18/24 0550  WBC 14.7* 13.4*  NEUTROABS 9.6*  --   HGB 14.2 13.3  HCT 45.1 42.8  MCV 92.0 91.6  PLT 293 280    Basic Metabolic Panel: Recent Labs  Lab 05/18/24 0030 05/18/24 0550  NA 135 136  K 3.1* 3.4*  CL 93* 95*  CO2 29 23  GLUCOSE 162* 249*  BUN 16 15  CREATININE 1.07* 1.21*  CALCIUM  8.6* 8.4*   GFR: CrCl cannot be calculated (Unknown ideal weight.). Recent Labs  Lab 05/18/24 0030 05/18/24 0040 05/18/24 0247 05/18/24 0550  WBC 14.7*  --   --  13.4*  LATICACIDVEN  --  2.1* 1.0  --     Liver Function Tests: Recent Labs  Lab 05/18/24 0030  AST 17  ALT 11  ALKPHOS 79  BILITOT 0.7  PROT 6.6  ALBUMIN 3.4*   No results for input(s): LIPASE, AMYLASE in the last 168 hours. No results for input(s): AMMONIA in the last 168 hours.  ABG    Component Value Date/Time   PHART 7.37 05/18/2024 0830   PCO2ART 57 (H) 05/18/2024 0830   PO2ART 57 (L) 05/18/2024 0830   HCO3 33.0 (H) 05/18/2024 0830   TCO2 28 02/16/2021 0727   O2SAT 89.5 05/18/2024 0830     Coagulation Profile: No results for input(s): INR, PROTIME in the last 168 hours.  Cardiac Enzymes: No results for input(s): CKTOTAL, CKMB, CKMBINDEX, TROPONINI in the last 168 hours.  HbA1C: No results found for: HGBA1C  CBG: No results for input(s): GLUCAP in the last 168 hours.  Review of Systems:   Review of Systems  Constitutional:  Positive for chills. Negative for fever.  Respiratory:  Positive for  cough, sputum production, shortness of breath and wheezing. Negative for hemoptysis.   Cardiovascular:  Negative for chest pain and leg swelling.  Gastrointestinal:  Negative for abdominal pain, nausea and vomiting.  Neurological:  Negative for dizziness and loss of consciousness.  Endo/Heme/Allergies:  Positive for environmental allergies.   Past Medical History:  She,  has a past medical history of COPD with asthma (HCC), DOE (dyspnea on exertion), History of hyperthyroidism, Hypertension, Nocturnal hypoxemia, Nocturnal leg cramps, RLS (restless legs syndrome), and Wears dentures.   Surgical History:   Past Surgical History:  Procedure Laterality Date   ABDOMINAL HYSTERECTOMY  1989   WITH UNILATERAL SALPINGOOPHORECTOMY AND APPENDECTOMY   ADJUSTABLE  SUTURE MANIPULATION Right 02/16/2021   Procedure: RIGHT MEDIAL RECTUS ADJUSTABLE SUTURE MANIPULATION;  Surgeon: Jacques Sharper, MD;  Location: [facility];  Service: Ophthalmology;  Laterality: Right;   MEDIAN RECTUS REPAIR Right 02/16/2021   Procedure: RIGHT LATERAL RECTUS RECESSION;  Surgeon: Jacques Sharper, MD;  Location[facility];  Service: Ophthalmology;  Laterality: Right;   MUSCLE RECESSION AND RESECTION Right 02/16/2021   Procedure: RIGHT MEDIAL RECTUS RESECTION;  Surgeon: Jacques Sharper, MD;  Location: [facility];  Service: Ophthalmology;  Laterality: Right;   STRABISMUS SURGERY Left 02/16/2021   Procedure: LEFT INFERIOR OBLIQUE RESECTION;  Surgeon: Jacques Sharper, MD;  Location[facility];  Service: Ophthalmology;  Laterality: Left;     Social History:   reports that she has been smoking cigarettes. She has never used smokeless tobacco. She reports that she does not drink alcohol and does not use drugs.   Family History:  Her family history includes Heart attack in her brother and father; Heart failure in her mother; Kidney failure in her mother.    Allergies Allergies  Allergen Reactions   Trazodone  And Nefazodone Other (See Comments)    Suicidal thoughts, hallucinations.     Home Medications  Prior to Admission medications   Medication Sig Start Date End Date Taking? Authorizing Provider  albuterol  (PROVENTIL  HFA;VENTOLIN  HFA) 108 (90 BASE) MCG/ACT inhaler Inhale 2 puffs into the lungs every 6 (six) hours as needed for wheezing or shortness of breath.   Yes [provider]  albuterol  (PROVENTIL ) (5 MG/ML) 0.5% nebulizer solution Take 0.5 mLs (2.5 mg total) by nebulization every 4 (four) hours while awake for 3 days, THEN 0.5 mLs (2.5 mg total) every 4 (four) hours as needed for wheezing or shortness of breath. Patient taking differently: Inhale 0.5 mLs (2.5 mg total) every 4 (four) hours as needed for wheezing or shortness of breath. 07/21/21 09/10/24 Yes Briana Elgin LABOR, MD  aspirin  81 MG chewable tablet Chew 81 mg by mouth every morning.   Yes [provider]  azithromycin  (ZITHROMAX ) 500 MG tablet Take 500 mg by mouth daily. 05/16/24  Yes [provider]  Calcium -Phosphorus-Vitamin D (CALCIUM /VITAMIN D3/ADULT GUMMY PO) Take 2 each by mouth every morning.   Yes [provider]  escitalopram  (LEXAPRO ) 10 MG tablet Take 10 mg by mouth at bedtime. 09/18/18  Yes [provider]  fluticasone  (FLONASE ) 50 MCG/ACT nasal spray Place 2 sprays into both nostrils daily as needed for allergies or rhinitis.   Yes [provider]  hydrochlorothiazide  (HYDRODIURIL ) 25 MG tablet Take 25 mg by mouth every morning. 09/03/15  Yes [provider]  ibuprofen (ADVIL) 800 MG tablet Take 800 mg by mouth every 8 (eight) hours as needed for moderate pain. 06/27/21  Yes [provider]  levocetirizine (XYZAL) 5 MG tablet Take 5 mg by mouth daily. 05/09/24  Yes [provider]  Multiple Vitamins-Minerals (ADULT ONE DAILY GUMMIES) CHEW Chew 2 each by mouth every morning.   Yes [provider]  omeprazole (PRILOSEC) 40 MG capsule Take 40 mg by mouth 2 (two) times daily as needed (heartburn, indigestion). 12/24/23  Yes [provider]  OXYGEN  Inhale 3 L into the lungs 2 (two) times daily as needed (Respiratory symptoms).   Yes [provider]  SYMBICORT  80-4.5 MCG/ACT inhaler Inhale 2 puffs into the lungs 2 (two) times daily as needed (Respiratory symptoms). 04/07/24  Yes [provider]     Critical care time: n/a  Lyle Pesa, NP [facility] 05/18/2024, 11:21 AM  See Amion for pager If no response to pager , please call [phone] until 7pm After 7:00 pm call Elink  [phone]

## 2024-05-18 NOTE — Progress Notes (Signed)
  Echocardiogram 2D Echocardiogram has been performed.  LAMON MAXWELL 05/18/2024, 5:48 PM

## 2024-05-19 DIAGNOSIS — J441 Chronic obstructive pulmonary disease with (acute) exacerbation: Secondary | ICD-10-CM | POA: Diagnosis not present

## 2024-05-19 LAB — BASIC METABOLIC PANEL WITH GFR
Anion gap: 14 (ref 5–15)
BUN: 20 mg/dL (ref 8–23)
CO2: 32 mmol/L (ref 22–32)
Calcium: 8.9 mg/dL (ref 8.9–10.3)
Chloride: 94 mmol/L — ABNORMAL LOW (ref 98–111)
Creatinine, Ser: 1.02 mg/dL — ABNORMAL HIGH (ref 0.44–1.00)
GFR, Estimated: >60 mL/min (ref >60)
Glucose, Bld: 140 mg/dL — ABNORMAL HIGH (ref 70–99)
Potassium: 3.8 mmol/L (ref 3.5–5.1)
Sodium: 140 mmol/L (ref 135–145)

## 2024-05-19 LAB — CBC
HCT: 41.6 % (ref 36.0–46.0)
Hemoglobin: 13.2 g/dL (ref 12.0–15.0)
MCH: 28.6 pg (ref 26.0–34.0)
MCHC: 31.7 g/dL (ref 30.0–36.0)
MCV: 90.2 fL (ref 80.0–100.0)
Platelets: 292 K/uL (ref 150–400)
RBC: 4.61 MIL/uL (ref 3.87–5.11)
RDW: 13.5 % (ref 11.5–15.5)
WBC: 16 K/uL — ABNORMAL HIGH (ref 4.0–10.5)
nRBC: 0 % (ref 0.0–0.2)

## 2024-05-19 LAB — PROCALCITONIN: Procalcitonin: <0.1 ng/mL

## 2024-05-19 LAB — MAGNESIUM: Magnesium: 2.6 mg/dL — ABNORMAL HIGH (ref 1.7–2.4)

## 2024-05-19 MED ORDER — METHYLPREDNISOLONE SODIUM SUCC 40 MG IJ SOLR
40.0000 mg | Freq: Two times a day (BID) | INTRAMUSCULAR | Status: DC
Start: 2024-05-19 — End: 2024-05-21
  Administered 2024-05-19 – 2024-05-21 (×4): 40 mg via INTRAVENOUS
  Filled 2024-05-19 (×4): qty 1

## 2024-05-19 NOTE — Consult Note (Signed)
 NAME:  [name], MRN:  [id number], DOB:  21-Jul-1956, LOS: 1 ADMISSION DATE:  05/18/2024, CONSULTATION DATE:  05/18/24 REFERRING MD:  Dr. Cheryle, CHIEF COMPLAINT:  SOB   History of Present Illness:   17 yoF with PMH as below significant for ongoing tobacco abuse (up to 10 cigarettes/ day), COPD, chronic hypoxic respiratory failure, and HTN admitted overnight by TRH for AECOPD.  Pt reports she has had worsening cough, chills, decreased appetite, greenish productive sputum, and needing her home oxygen  intermittently for the last 3 weeks.  Became sick again Friday and progressively SOB, noted her O2 sats in the 70's at home and called EMS.  Has been exposed to sick family member ~1 week ago and recently traveled to [place]  1 week ago by car.  Has completed 2 rounds of antibiotics of azithromycin  and steroids previously. Did feel better and cough improved after these treatments.  Denies any chest pain, dizziness, syncope, leg swelling or pain, or N/V.  Has home oxygen  in which she used to use daily, then nocturnal and now only as needed.  Has nebs at home but does not use any nebs/ inhalers on a daily use, only when sick or with allergies.  Reports 3 flare ups with her breathing since January but overall has been doing better over last several years with last hospitalization was 12/2021 for AECOPD.  Last seen in pulmonary clinic by Dr. Shelah 09/2017 and since managed by her PCP.    Initially requiring NRB with EMS since weaned down to 6L [place], found in ER to be afebrile, WBC 14.7, initial lactic 2.1> 1, BNP 115, COVID/ RSV/ flu neg, and CXR showing cardiomegaly and vascular congestion.  Treated with solumedrol, nebs, and started on ceftriaxone .  Reported ongoing dyspnea and O2 requirement still requiring 6L despite treatment.  Pt does report improvement with appetite this am.  ABG 7.37/ 57/ 57/ 33.  Remains hemodynamically stable.  Pulmonary consulted for further recommendations.    Pertinent  Medical History    Past Medical History:  Diagnosis Date   COPD with asthma (HCC)    currently followed by pcp (previous as seen pulmonogist --- dr byrum lov note in epic 10-04-2017),  last exacerbation with pneumonia 04/ 2022 pt stated completed antibiotic , no residual)   DOE (dyspnea on exertion)    02-15-2021  per pt only with hills/ stairs , no issue with other activities   History of hyperthyroidism    s/p  RAI 12/ 2013,  followed by pcp   Hypertension    Nocturnal hypoxemia    uses 2L oxygen  at night only,  (02-15-2021 pt stated O2 sat can get down 70--88%,  stated during the day O2 sat on RA are normal)   Nocturnal leg cramps    occasional   RLS (restless legs syndrome)    Wears dentures    upper   Significant Hospital Events: Including procedures, antibiotic start and stop dates in addition to other pertinent events     Interim History / Subjective:   Objective    Blood pressure 123/63, pulse 64, temperature 97.7 F (36.5 C), temperature source Oral, resp. rate 18, height 5' 2 (1.575 m), weight 87.3 kg, SpO2 94%.        Intake/Output Summary (Last 24 hours) at 05/19/2024 1812 Last data filed at 05/19/2024 1643 Gross per 24 hour  Intake 723 ml  Output --  Net 723 ml   Filed Weights   05/18/24 0445  Weight: 87.3 kg  Examination: General:  Pleasant and jovial older female sitting up on side of bed in NAD HEENT: MM pink/moist Neuro: AO, appropriate, MAE CV: rr, no murmur PULM:  non labored, diffuse insp/ exp wheeze, diminished, scattered rhonchi, few posterior bibasilar rales, becomes dyspneic with coughing spells with clear mucous but otherwise no conversational dyspnea Extremities: warm/dry, no pitting tibial edema, no calf tenderness   Resolved problem list   Assessment and Plan   Acute on chronic hypoxic and hypercarbic respiratory failure secondary to  COPD with acute exacerbation, rhinovirus + Tobacco abuse  - continue IV steroids solumedrol 40mg  IV twice daily -  continue nebulizer treatments - continue ceftriaxone  and doxycycline  - cont to wean supplemental O2 for sat 88-94% - cont abx with ctx and doxycycline , pending PCT - check echo given BNP 115 and hx HTN - tobacco cessation counseling as appropriate   PCCM will continue to follow.  Labs   CBC: Recent Labs  Lab 05/18/24 0030 05/18/24 0550 05/19/24 0550  WBC 14.7* 13.4* 16.0*  NEUTROABS 9.6*  --   --   HGB 14.2 13.3 13.2  HCT 45.1 42.8 41.6  MCV 92.0 91.6 90.2  PLT 293 280 292    Basic Metabolic Panel: Recent Labs  Lab 05/18/24 0030 05/18/24 0550 05/19/24 0550  NA 135 136 140  K 3.1* 3.4* 3.8  CL 93* 95* 94*  CO2 29 23 32  GLUCOSE 162* 249* 140*  BUN 16 15 20   CREATININE 1.07* 1.21* 1.02*  CALCIUM  8.6* 8.4* 8.9  MG  --   --  2.6*   GFR: Estimated Creatinine Clearance: 54.9 mL/min (A) (by C-G formula based on SCr of 1.02 mg/dL (H)). Recent Labs  Lab 05/18/24 0030 05/18/24 0040 05/18/24 0247 05/18/24 0550 05/19/24 0550  PROCALCITON  --   --   --   --  <0.10  WBC 14.7*  --   --  13.4* 16.0*  LATICACIDVEN  --  2.1* 1.0  --   --     Liver Function Tests: Recent Labs  Lab 05/18/24 0030  AST 17  ALT 11  ALKPHOS 79  BILITOT 0.7  PROT 6.6  ALBUMIN 3.4*   No results for input(s): LIPASE, AMYLASE in the last 168 hours. No results for input(s): AMMONIA in the last 168 hours.  ABG    Component Value Date/Time   PHART 7.37 05/18/2024 0830   PCO2ART 57 (H) 05/18/2024 0830   PO2ART 57 (L) 05/18/2024 0830   HCO3 33.0 (H) 05/18/2024 0830   TCO2 28 02/16/2021 0727   O2SAT 89.5 05/18/2024 0830     Coagulation Profile: No results for input(s): INR, PROTIME in the last 168 hours.  Cardiac Enzymes: No results for input(s): CKTOTAL, CKMB, CKMBINDEX, TROPONINI in the last 168 hours.  HbA1C: No results found for: HGBA1C  CBG: No results for input(s): GLUCAP in the last 168 hours.    Critical care time: n/a    Dorn Chill,  MD [facility] Office: [phone]   See Amion for personal pager PCCM on call pager [phone] until 7pm. Please call Elink 7p-7a. [phone]

## 2024-05-19 NOTE — TOC Initial Note (Signed)
 Transition of Care [facility]) - Initial/Assessment Note    Patient Details  Name: [name] MRN: [id number] Date of Birth: [birth year]  [facility] CM/SW Contact:    Sudie Erminio Deems, RN Phone Number: 05/19/2024, 2:15 PM  Clinical Narrative:  Patient presented for cough and shortness of breath. PTA patient was from home with her 68 year old child- child is with neighbor while she is hospitalized. Patient has DME oxygen  in the home via [employer]-4 Liters continuous. Case Manager will continue to follow for additional disposition needs.                 Expected Discharge Plan: Home/Self Care Barriers to Discharge: Continued Medical Work up   Patient Goals and CMS Choice Patient states their goals for this hospitalization and ongoing recovery are:: Plan to transition home once stable          Expected Discharge Plan and Services   Discharge Planning Services: CM Consult Post Acute Care Choice: NA Living arrangements for the past 2 months: Single Family Home                                      Prior Living Arrangements/Services Living arrangements for the past 2 months: Single Family Home Lives with:: Minor Children Patient language and need for interpreter reviewed:: Yes Do you feel safe going back to the place where you live?: Yes      Need for Family Participation in Patient Care: No (Comment) Care giver support system in place?: No (comment)   Criminal Activity/Legal Involvement Pertinent to Current Situation/Hospitalization: No - Comment as needed  Activities of Daily Living   ADL Screening (condition at time of admission) Independently performs ADLs?: Yes (appropriate for developmental age) Is the patient deaf or have difficulty hearing?: No Does the patient have difficulty seeing, even when wearing glasses/contacts?: No Does the patient have difficulty concentrating, remembering, or making decisions?: No  Permission  Sought/Granted Permission sought to share information with : Case Manager, Family Supports                Emotional Assessment Appearance:: Appears stated age Attitude/Demeanor/Rapport: Engaged Affect (typically observed): Appropriate Orientation: : Oriented to Self, Oriented to Place, Oriented to  Time, Oriented to Situation Alcohol / Substance Use: Not Applicable Psych Involvement: No (comment)  Admission diagnosis:  COPD exacerbation (HCC) [J44.1] Patient Active Problem List   Diagnosis Date Noted   COPD exacerbation (HCC) 01/02/2022   Acute on chronic respiratory failure with hypoxia (HCC) 01/02/2022   COPD with acute exacerbation (HCC) 07/21/2021   Acute hypoxemic respiratory failure (HCC) 07/20/2021   QT prolongation 07/20/2021   Acute respiratory failure with hypoxia (HCC) 11/23/2018   AKI (acute kidney injury) (HCC) 11/23/2018   CAP (community acquired pneumonia) 11/23/2018   Sepsis (HCC) 11/23/2018   Diarrhea 11/23/2018   Dehydration 11/23/2018   Hypokalemia 11/23/2018   COPD (chronic obstructive pulmonary disease) (HCC) 09/06/2015   Nocturnal hypoxemia 09/06/2015   Nicotine  dependence 09/06/2015   RLS (restless legs syndrome) 06/22/2015   Nocturnal leg cramps 06/22/2015   DEPRESSION/ANXIETY 06/30/2010   OTHER CONVULSIONS 06/30/2010   Torticollis 06/16/2010   ONYCHOMYCOSIS 03/01/2009   TOBACCO ABUSE 11/20/2008   Asthma 11/20/2008   HYPERTENSION, BENIGN ESSENTIAL 09/11/2005   PCP:  Celestia Rosaline SQUIBB, NP Pharmacy:   [id number] - [place], [place] - [address] [place] [address] Phone: [phone]  Fax: [phone]     Social Drivers of Health (SDOH) Social History: SDOH Screenings   Food Insecurity: No Food Insecurity (05/18/2024)  Housing: Low Risk  (05/18/2024)  Transportation Needs: No Transportation Needs (05/18/2024)  Utilities: Not At Risk (05/18/2024)  Social Connections: Moderately Integrated (05/18/2024)  Tobacco  Use: High Risk (05/18/2024)   SDOH Interventions:     Readmission Risk Interventions     No data to display

## 2024-05-19 NOTE — Progress Notes (Signed)
 PROGRESS NOTE    [name]  FMW:[id number] DOB: [birth year] DOA: 05/18/2024 PCP: Celestia Rosaline SQUIBB, NP   Brief Narrative:  68 y.o. female with medical history significant for hypertension, depression, anxiety, COPD, and chronic hypoxic respiratory failure presented with cough and worsening shortness of breath.  She was diagnosed with pneumonia 3 weeks ago and was treated with antibiotics.  Subsequently she has completed 2 different antibiotics and IV/steroids.  On presentation, she was hypoxic and in respiratory distress requiring 15 L supplemental oxygen .  She was given IV Solu-Medrol  and nebs.  In the ED, she was placed on 6 L oxygen .  WBC of 14,700, lactic acid of 2.1.  COVID/influenza/RSV PCR negative.  Chest x-ray showed cardiomegaly and vascular congestion.  Pulmonary was consulted.  Assessment & Plan:   Acute on chronic respiratory failure with hypoxia and hypercapnia COPD exacerbation due to rhinovirus Tobacco use - Patient continues to smoke as an outpatient.  He is on 3 L oxygen  via nasal cannula.  Was in significant respiratory distress on presentation requiring 15 L supplemental oxygen .  Respiratory status improving.  Currently on 5 L oxygen  via nasal cannula.  Pulmonary following.  Continue Solu-Medrol  and current nebs.  Respiratory panel PCR positive for rhinovirus - Doubt that patient has bacterial pneumonia: Might be able to DC antibiotics if okay with pulmonary - Counseled regarding tobacco cessation.  Hypokalemia - Improved  Lactic acidosis - Resolved  Leukocytosis - Monitor  Hypertension - Monitor blood pressure.  Continue hydrochlorothiazide   Anxiety/depression - Continue escitalopram   Obesity class II - Outpatient follow-up  DVT prophylaxis: Lovenox  Code Status: Full Family Communication: None at bedside Disposition Plan: Status is: Inpatient Remains inpatient appropriate because: Of severity of illness    Consultants: Pulmonary  Procedures:  None  Antimicrobials:  Anti-infectives (From admission, onward)    Start     Dose/Rate Route Frequency Ordered Stop   05/18/24 1215  doxycycline  (VIBRA -TABS) tablet 100 mg        100 mg Oral Every 12 hours 05/18/24 1128     05/18/24 0315  cefTRIAXone  (ROCEPHIN ) 1 g in sodium chloride  0.9 % 100 mL IVPB        1 g 200 mL/hr over 30 Minutes Intravenous Every 24 hours 05/18/24 0313 05/23/24 0314        Subjective: Patient seen and examined at bedside.  Feels slightly better but still short of breath with exertion.  No fever, vomiting, worsening abdominal pain reported  Objective: Vitals:   05/18/24 2144 05/19/24 0037 05/19/24 0410 05/19/24 0801  BP:  (!) 173/95 (!) 150/101 (!) 152/95  Pulse:  63  68  Resp:  19 18 18   Temp:  98.2 F (36.8 C) 97.7 F (36.5 C) 98 F (36.7 C)  TempSrc:  Oral Oral Axillary  SpO2: 93% 94%  94%  Weight:      Height:        Intake/Output Summary (Last 24 hours) at 05/19/2024 0835 Last data filed at 05/18/2024 0900 Gross per 24 hour  Intake 240 ml  Output --  Net 240 ml   Filed Weights   05/18/24 0445  Weight: 87.3 kg    Examination:  General exam: Appears calm and comfortable.  On 5 L oxygen  management. Respiratory system: Bilateral decreased breath sounds at bases with scattered wheezing Cardiovascular system: S1 & S2 heard, Rate controlled Gastrointestinal system: Abdomen is obese, nondistended, soft and nontender. Normal bowel sounds heard. Extremities: No cyanosis, clubbing, edema  Central nervous system: Alert and oriented. No  focal neurological deficits. Moving extremities Skin: No rashes, lesions or ulcers Psychiatry: Flat affect.  Not agitated.   Data Reviewed: I have personally reviewed following labs and imaging studies  CBC: Recent Labs  Lab 05/18/24 0030 05/18/24 0550 05/19/24 0550  WBC 14.7* 13.4* 16.0*  NEUTROABS 9.6*  --   --   HGB 14.2 13.3 13.2  HCT 45.1 42.8 41.6  MCV 92.0 91.6 90.2  PLT 293 280 292   Basic  Metabolic Panel: Recent Labs  Lab 05/18/24 0030 05/18/24 0550 05/19/24 0550  NA 135 136 140  K 3.1* 3.4* 3.8  CL 93* 95* 94*  CO2 29 23 32  GLUCOSE 162* 249* 140*  BUN 16 15 20   CREATININE 1.07* 1.21* 1.02*  CALCIUM  8.6* 8.4* 8.9  MG  --   --  2.6*   GFR: Estimated Creatinine Clearance: 54.9 mL/min (A) (by C-G formula based on SCr of 1.02 mg/dL (H)). Liver Function Tests: Recent Labs  Lab 05/18/24 0030  AST 17  ALT 11  ALKPHOS 79  BILITOT 0.7  PROT 6.6  ALBUMIN 3.4*   No results for input(s): LIPASE, AMYLASE in the last 168 hours. No results for input(s): AMMONIA in the last 168 hours. Coagulation Profile: No results for input(s): INR, PROTIME in the last 168 hours. Cardiac Enzymes: No results for input(s): CKTOTAL, CKMB, CKMBINDEX, TROPONINI in the last 168 hours. BNP (last 3 results) No results for input(s): PROBNP in the last 8760 hours. HbA1C: No results for input(s): HGBA1C in the last 72 hours. CBG: No results for input(s): GLUCAP in the last 168 hours. Lipid Profile: No results for input(s): CHOL, HDL, LDLCALC, TRIG, CHOLHDL, LDLDIRECT in the last 72 hours. Thyroid Function Tests: No results for input(s): TSH, T4TOTAL, FREET4, T3FREE, THYROIDAB in the last 72 hours. Anemia Panel: No results for input(s): VITAMINB12, FOLATE, FERRITIN, TIBC, IRON, RETICCTPCT in the last 72 hours. Sepsis Labs: Recent Labs  Lab 05/18/24 0040 05/18/24 0247  LATICACIDVEN 2.1* 1.0    Recent Results (from the past 240 hours)  Blood culture (routine x 2)     Status: None (Preliminary result)   Collection Time: 05/18/24 12:26 AM   Specimen: BLOOD  Result Value Ref Range Status   Specimen Description BLOOD BLOOD RIGHT ARM  Final   Special Requests   Final    BOTTLES DRAWN AEROBIC AND ANAEROBIC Blood Culture adequate volume   Culture   Final    NO GROWTH 1 DAY Performed at [facility], [address]  [place], [place] [address]    Report Status PENDING  Incomplete  Resp panel by RT-PCR (RSV, Flu A&B, Covid)     Status: None   Collection Time: 05/18/24 12:27 AM   Specimen: Nasal Swab  Result Value Ref Range Status   SARS Coronavirus 2 by RT PCR NEGATIVE NEGATIVE Final   Influenza A by PCR NEGATIVE NEGATIVE Final   Influenza B by PCR NEGATIVE NEGATIVE Final    Comment: (NOTE) The Xpert Xpress SARS-CoV-2/FLU/RSV plus assay is intended as an aid in the diagnosis of influenza from Nasopharyngeal swab specimens and should not be used as a sole basis for treatment. Nasal washings and aspirates are unacceptable for Xpert Xpress SARS-CoV-2/FLU/RSV testing.  Fact Sheet for Patients: BloggerCourse.com  Fact Sheet for Healthcare Providers: SeriousBroker.it  This test is not yet approved or cleared by the [place]  FDA and has been authorized for detection and/or diagnosis of SARS-CoV-2 by FDA under an Emergency Use Authorization (EUA). This EUA  will remain in effect (meaning this test can be used) for the duration of the COVID-19 declaration under Section 564(b)(1) of the Act, 21 U.S.C. section 360bbb-3(b)(1), unless the authorization is terminated or revoked.     Resp Syncytial Virus by PCR NEGATIVE NEGATIVE Final    Comment: (NOTE) Fact Sheet for Patients: BloggerCourse.com  Fact Sheet for Healthcare Providers: SeriousBroker.it  This test is not yet approved or cleared by the [place]  FDA and has been authorized for detection and/or diagnosis of SARS-CoV-2 by FDA under an Emergency Use Authorization (EUA). This EUA will remain in effect (meaning this test can be used) for the duration of the COVID-19 declaration under Section 564(b)(1) of the Act, 21 U.S.C. section 360bbb-3(b)(1), unless the authorization is terminated or revoked.  Performed at [facility], [address] [place], [place] [address]   Blood culture (routine x 2)     Status: None (Preliminary result)   Collection Time: 05/18/24 12:31 AM   Specimen: BLOOD  Result Value Ref Range Status   Specimen Description BLOOD BLOOD LEFT ARM  Final   Special Requests   Final    BOTTLES DRAWN AEROBIC AND ANAEROBIC Blood Culture adequate volume   Culture   Final    NO GROWTH 1 DAY Performed at [facility], [address] [place], [place] [address]    Report Status PENDING  Incomplete  Respiratory (~20 pathogens) panel by PCR     Status: Abnormal   Collection Time: 05/18/24  6:15 PM   Specimen: Nasopharyngeal Swab; Respiratory  Result Value Ref Range Status   Adenovirus NOT DETECTED NOT DETECTED Final   Coronavirus 229E NOT DETECTED NOT DETECTED Final   Coronavirus HKU1 NOT DETECTED NOT DETECTED Final   Coronavirus NL63 NOT DETECTED NOT DETECTED Final   Coronavirus OC43 NOT DETECTED NOT DETECTED Final   Metapneumovirus NOT DETECTED NOT DETECTED Final   Rhinovirus / Enterovirus DETECTED (A) NOT DETECTED Final   Influenza A NOT DETECTED NOT DETECTED Final   Influenza B NOT DETECTED NOT DETECTED Final   Parainfluenza Virus 1 NOT DETECTED NOT DETECTED Final   Parainfluenza Virus 2 NOT DETECTED NOT DETECTED Final   Parainfluenza Virus 3 NOT DETECTED NOT DETECTED Final   Parainfluenza Virus 4 NOT DETECTED NOT DETECTED Final   Respiratory Syncytial Virus NOT DETECTED NOT DETECTED Final   Bordetella pertussis NOT DETECTED NOT DETECTED Final   Bordetella Parapertussis NOT DETECTED NOT DETECTED Final   Chlamydophila pneumoniae NOT DETECTED NOT DETECTED Final   Mycoplasma pneumoniae NOT DETECTED NOT DETECTED Final    Comment: Performed at [facility], [address] [place], [place] [address]  Expectorated Sputum Assessment w Gram Stain, Rflx to Resp Cult     Status: None   Collection Time: 05/18/24  6:30 PM   Specimen: Sputum  Result Value Ref Range Status   Specimen Description SPUTUM   Final   Special Requests NONE  Final   Sputum evaluation   Final    THIS SPECIMEN IS ACCEPTABLE FOR SPUTUM CULTURE Performed at [facility], [address], [place], [place] [address]    Report Status 05/18/2024 FINAL  Final  Culture, Respiratory w Gram Stain     Status: None (Preliminary result)   Collection Time: 05/18/24  6:30 PM   Specimen: SPU  Result Value Ref Range Status   Specimen Description SPUTUM  Final   Special Requests NONE Reflexed from [id number]  Final   Gram Stain  Final    FEW WBC PRESENT, PREDOMINANTLY PMN FEW GRAM POSITIVE COCCI RARE GRAM NEGATIVE RODS Performed at [facility], [address] [place], [place] [address]    Culture PENDING  Incomplete   Report Status PENDING  Incomplete         Radiology Studies: VAS US  LOWER EXTREMITY VENOUS (DVT) Result Date: 05/18/2024  Lower Venous DVT Study Patient Name:  [name]  Date of Exam:   05/18/2024 Medical Rec #: [id number]      Accession #:    [id number] Date of Birth: 1956-06-05     Patient Gender: F Patient Age:   47 years Exam Location:  [facility] Procedure:      VAS US  LOWER EXTREMITY VENOUS (DVT) Referring Phys: PAULA SIMPSON --------------------------------------------------------------------------------  Indications: SOB, and CHF. Other Indications: Chronic hypoxic respiratory failure. Comparison Study: No priors. Performing Technologist: Ricka Sturdivant-Jones RDMS, RVT  Examination Guidelines: A complete evaluation includes B-mode imaging, spectral Doppler, color Doppler, and power Doppler as needed of all accessible portions of each vessel. Bilateral testing is considered an integral part of a complete examination. Limited examinations for reoccurring indications may be performed as noted. The reflux portion of the exam is performed with the patient in reverse Trendelenburg.  +---------+---------------+---------+-----------+----------+--------------+ RIGHT     CompressibilityPhasicitySpontaneityPropertiesThrombus Aging +---------+---------------+---------+-----------+----------+--------------+ CFV      Full           Yes      Yes                                 +---------+---------------+---------+-----------+----------+--------------+ SFJ      Full                                                        +---------+---------------+---------+-----------+----------+--------------+ FV Prox  Full                                                        +---------+---------------+---------+-----------+----------+--------------+ FV Mid   Full           Yes      Yes                                 +---------+---------------+---------+-----------+----------+--------------+ FV DistalFull                                                        +---------+---------------+---------+-----------+----------+--------------+ PFV      Full                                                        +---------+---------------+---------+-----------+----------+--------------+ POP      Full           Yes  Yes                                 +---------+---------------+---------+-----------+----------+--------------+ PTV      Full                                                        +---------+---------------+---------+-----------+----------+--------------+ PERO     Full                                                        +---------+---------------+---------+-----------+----------+--------------+   +---------+---------------+---------+-----------+----------+--------------+ LEFT     CompressibilityPhasicitySpontaneityPropertiesThrombus Aging +---------+---------------+---------+-----------+----------+--------------+ CFV      Full           Yes      Yes                                 +---------+---------------+---------+-----------+----------+--------------+ SFJ      Full                                                         +---------+---------------+---------+-----------+----------+--------------+ FV Prox  Full                                                        +---------+---------------+---------+-----------+----------+--------------+ FV Mid   Full           Yes      Yes                                 +---------+---------------+---------+-----------+----------+--------------+ FV DistalFull                                                        +---------+---------------+---------+-----------+----------+--------------+ PFV      Full                                                        +---------+---------------+---------+-----------+----------+--------------+ POP      Full           Yes      Yes                                 +---------+---------------+---------+-----------+----------+--------------+ PTV      Full                                                        +---------+---------------+---------+-----------+----------+--------------+  PERO     Full                                                        +---------+---------------+---------+-----------+----------+--------------+     Summary: BILATERAL: - No evidence of deep vein thrombosis seen in the lower extremities, bilaterally. -No evidence of popliteal cyst, bilaterally.   *See table(s) above for measurements and observations. Electronically signed by Gaile New MD on 05/18/2024 at 8:26:24 PM.    Final    DG Chest Portable 1 View Result Date: 05/18/2024 CLINICAL DATA:  Shortness of breath EXAM: PORTABLE CHEST 1 VIEW COMPARISON:  01/13/2022 FINDINGS: Mild cardiomegaly, vascular congestion. No overt edema, confluent opacities or effusions. No acute bony abnormality. IMPRESSION: Cardiomegaly, vascular congestion. Electronically Signed   By: Franky Crease M.D.   On: 05/18/2024 00:54        Scheduled Meds:  arformoterol   15 mcg Nebulization BID   aspirin   81 mg Oral q morning   budesonide   (PULMICORT ) nebulizer solution  0.5 mg Nebulization BID   doxycycline   100 mg Oral Q12H   enoxaparin  (LOVENOX ) injection  40 mg Subcutaneous Daily   escitalopram   10 mg Oral QHS   hydrochlorothiazide   25 mg Oral q morning   ipratropium-albuterol   3 mL Nebulization Q6H   nicotine   14 mg Transdermal Daily   sodium chloride  flush  3 mL Intravenous Q12H   Continuous Infusions:  cefTRIAXone  (ROCEPHIN )  IV Stopped (05/19/24 0515)          Sophie Mao, MD [facility] 05/19/2024, 8:35 AM

## 2024-05-19 NOTE — Plan of Care (Signed)

## 2024-05-20 DIAGNOSIS — J441 Chronic obstructive pulmonary disease with (acute) exacerbation: Secondary | ICD-10-CM | POA: Diagnosis not present

## 2024-05-20 LAB — BASIC METABOLIC PANEL WITH GFR
Anion gap: 10 (ref 5–15)
BUN: 31 mg/dL — ABNORMAL HIGH (ref 8–23)
CO2: 34 mmol/L — ABNORMAL HIGH (ref 22–32)
Calcium: 9 mg/dL (ref 8.9–10.3)
Chloride: 92 mmol/L — ABNORMAL LOW (ref 98–111)
Creatinine, Ser: 1.03 mg/dL — ABNORMAL HIGH (ref 0.44–1.00)
GFR, Estimated: 60 mL/min — ABNORMAL LOW (ref >60)
Glucose, Bld: 106 mg/dL — ABNORMAL HIGH (ref 70–99)
Potassium: 4.1 mmol/L (ref 3.5–5.1)
Sodium: 136 mmol/L (ref 135–145)

## 2024-05-20 LAB — CBC
HCT: 41.8 % (ref 36.0–46.0)
Hemoglobin: 13.4 g/dL (ref 12.0–15.0)
MCH: 28.6 pg (ref 26.0–34.0)
MCHC: 32.1 g/dL (ref 30.0–36.0)
MCV: 89.3 fL (ref 80.0–100.0)
Platelets: 309 K/uL (ref 150–400)
RBC: 4.68 MIL/uL (ref 3.87–5.11)
RDW: 13.3 % (ref 11.5–15.5)
WBC: 18.7 K/uL — ABNORMAL HIGH (ref 4.0–10.5)
nRBC: 0 % (ref 0.0–0.2)

## 2024-05-20 LAB — MAGNESIUM: Magnesium: 2.3 mg/dL (ref 1.7–2.4)

## 2024-05-20 MED ORDER — HYDRALAZINE HCL 20 MG/ML IJ SOLN
10.0000 mg | INTRAMUSCULAR | Status: DC | PRN
  Administered 2024-05-20 – 2024-05-21 (×2): 10 mg via INTRAVENOUS
  Filled 2024-05-20 (×2): qty 1

## 2024-05-20 MED ORDER — GUAIFENESIN-CODEINE 100-10 MG/5ML PO SOLN
5.0000 mL | Freq: Four times a day (QID) | ORAL | Status: DC | PRN
Start: 2024-05-20 — End: 2024-05-21

## 2024-05-20 NOTE — Progress Notes (Signed)
 [facility] night cross cover note:   I was notified by the patient's RN of the patient's SBP's in the 170's - 180's mmHg with corresponding HR's in the 50's. No new symptoms reported at this time.  I subsequently added as needed IV hydralazine  for systolic blood pressures greater than 170 mmHg.      Eva Pore, DO Hospitalist

## 2024-05-20 NOTE — Progress Notes (Signed)
 NAME:  [name], MRN:  [id number], DOB:  [birth year], LOS: 2 ADMISSION DATE:  05/18/2024, CONSULTATION DATE:  05/18/24 REFERRING MD:  Dr. Cheryle, CHIEF COMPLAINT:  SOB   History of Present Illness:   55 yoF with PMH as below significant for ongoing tobacco abuse (up to 10 cigarettes/ day), COPD, chronic hypoxic respiratory failure, and HTN admitted overnight by [facility] for AECOPD.  Pt reports she has had worsening cough, chills, decreased appetite, greenish productive sputum, and needing her home oxygen  intermittently for the last 3 weeks.  Became sick again Friday and progressively SOB, noted her O2 sats in the 70's at home and called EMS.  Has been exposed to sick family member ~1 week ago and recently traveled to [place]  1 week ago by car.  Has completed 2 rounds of antibiotics of azithromycin  and steroids previously. Did feel better and cough improved after these treatments.  Denies any chest pain, dizziness, syncope, leg swelling or pain, or N/V.  Has home oxygen  in which she used to use daily, then nocturnal and now only as needed.  Has nebs at home but does not use any nebs/ inhalers on a daily use, only when sick or with allergies.  Reports 3 flare ups with her breathing since January but overall has been doing better over last several years with last hospitalization was 12/2021 for AECOPD.  Last seen in pulmonary clinic by Dr. Shelah 09/2017 and since managed by her PCP.    Initially requiring NRB with EMS since weaned down to 6L , found in ER to be afebrile, WBC 14.7, initial lactic 2.1> 1, BNP 115, COVID/ RSV/ flu neg, and CXR showing cardiomegaly and vascular congestion.  Treated with solumedrol, nebs, and started on ceftriaxone .  Reported ongoing dyspnea and O2 requirement still requiring 6L despite treatment.  Pt does report improvement with appetite this am.  ABG 7.37/ 57/ 57/ 33.  Remains hemodynamically stable.  Pulmonary consulted for further recommendations.    Pertinent  Medical History    Past Medical History:  Diagnosis Date   COPD with asthma (HCC)    currently followed by pcp (previous as seen pulmonogist --- dr byrum lov note in epic 10-04-2017),  last exacerbation with pneumonia 04/ 2022 pt stated completed antibiotic , no residual)   DOE (dyspnea on exertion)    02-15-2021  per pt only with hills/ stairs , no issue with other activities   History of hyperthyroidism    s/p  RAI 12/ 2013,  followed by pcp   Hypertension    Nocturnal hypoxemia    uses 2L oxygen  at night only,  (02-15-2021 pt stated O2 sat can get down 70--88%,  stated during the day O2 sat on RA are normal)   Nocturnal leg cramps    occasional   RLS (restless legs syndrome)    Wears dentures    upper   Significant Hospital Events: Including procedures, antibiotic start and stop dates in addition to other pertinent events     Interim History / Subjective:   Patient had issue with hypertension overnight Reports significant coughing and dyspnea overnight  Objective    Blood pressure 130/71, pulse 96, temperature 97.7 F (36.5 C), temperature source Oral, resp. rate 20, height 5' 2 (1.575 m), weight 87.3 kg, SpO2 90%.        Intake/Output Summary (Last 24 hours) at 05/20/2024 1134 Last data filed at 05/19/2024 1643 Gross per 24 hour  Intake 360 ml  Output --  Net 360 ml  Filed Weights   05/18/24 0445  Weight: 87.3 kg    Examination: General:  Pleasant and jovial older female sitting up on side of bed in NAD HEENT: MM pink/moist Neuro: AO, appropriate, MAE CV: rr, no murmur PULM:  diffuse wheezing Extremities: warm/dry, no pitting tibial edema, no calf tenderness   Resolved problem list   Assessment and Plan   Acute on chronic hypoxic and hypercarbic respiratory failure secondary to  COPD with acute exacerbation, rhinovirus + Tobacco abuse  - continue IV steroids solumedrol 40mg  IV twice daily - continue nebulizer treatments - continue ceftriaxone  and doxycycline  - cont  to wean supplemental O2 for sat 88-94% - Echo pending - tobacco cessation counseling as appropriate   PCCM will continue to follow.  Labs   CBC: Recent Labs  Lab 05/18/24 0030 05/18/24 0550 05/19/24 0550 05/20/24 0533  WBC 14.7* 13.4* 16.0* 18.7*  NEUTROABS 9.6*  --   --   --   HGB 14.2 13.3 13.2 13.4  HCT 45.1 42.8 41.6 41.8  MCV 92.0 91.6 90.2 89.3  PLT 293 280 292 309    Basic Metabolic Panel: Recent Labs  Lab 05/18/24 0030 05/18/24 0550 05/19/24 0550 05/20/24 0533  NA 135 136 140 136  K 3.1* 3.4* 3.8 4.1  CL 93* 95* 94* 92*  CO2 29 23 32 34*  GLUCOSE 162* 249* 140* 106*  BUN 16 15 20  31*  CREATININE 1.07* 1.21* 1.02* 1.03*  CALCIUM  8.6* 8.4* 8.9 9.0  MG  --   --  2.6* 2.3   GFR: Estimated Creatinine Clearance: 54.4 mL/min (A) (by C-G formula based on SCr of 1.03 mg/dL (H)). Recent Labs  Lab 05/18/24 0030 05/18/24 0040 05/18/24 0247 05/18/24 0550 05/19/24 0550 05/20/24 0533  PROCALCITON  --   --   --   --  <0.10  --   WBC 14.7*  --   --  13.4* 16.0* 18.7*  LATICACIDVEN  --  2.1* 1.0  --   --   --     Liver Function Tests: Recent Labs  Lab 05/18/24 0030  AST 17  ALT 11  ALKPHOS 79  BILITOT 0.7  PROT 6.6  ALBUMIN 3.4*   No results for input(s): LIPASE, AMYLASE in the last 168 hours. No results for input(s): AMMONIA in the last 168 hours.  ABG    Component Value Date/Time   PHART 7.37 05/18/2024 0830   PCO2ART 57 (H) 05/18/2024 0830   PO2ART 57 (L) 05/18/2024 0830   HCO3 33.0 (H) 05/18/2024 0830   TCO2 28 02/16/2021 0727   O2SAT 89.5 05/18/2024 0830     Coagulation Profile: No results for input(s): INR, PROTIME in the last 168 hours.  Cardiac Enzymes: No results for input(s): CKTOTAL, CKMB, CKMBINDEX, TROPONINI in the last 168 hours.  HbA1C: No results found for: HGBA1C  CBG: No results for input(s): GLUCAP in the last 168 hours.    Critical care time: n/a    Dorn Chill, MD [facility] Office: [phone]   See Amion for personal pager PCCM on call pager [phone] until 7pm. Please call Elink 7p-7a. [phone]

## 2024-05-20 NOTE — Plan of Care (Signed)

## 2024-05-20 NOTE — Progress Notes (Signed)
 PROGRESS NOTE    [name]  FMW:[id number] DOB: 1956/06/19 DOA: 05/18/2024 PCP: Celestia Rosaline SQUIBB, NP   Brief Narrative:  68 y.o. female with medical history significant for hypertension, depression, anxiety, COPD, and chronic hypoxic respiratory failure presented with cough and worsening shortness of breath.  She was diagnosed with pneumonia 3 weeks ago and was treated with antibiotics.  Subsequently she has completed 2 different antibiotics and IV/steroids.  On presentation, she was hypoxic and in respiratory distress requiring 15 L supplemental oxygen .  She was given IV Solu-Medrol  and nebs.  In the ED, she was placed on 6 L oxygen .  WBC of 14,700, lactic acid of 2.1.  COVID/influenza/RSV PCR negative.  Chest x-ray showed cardiomegaly and vascular congestion.  Pulmonary was consulted.  Assessment & Plan:   Acute on chronic respiratory failure with hypoxia and hypercapnia COPD exacerbation due to rhinovirus Tobacco use - Patient continues to smoke as an outpatient.  She is on 3 L oxygen  via nasal cannula.  Was in significant respiratory distress on presentation requiring 15 L supplemental oxygen .  Respiratory status improving.  Currently on 4 L oxygen  via nasal cannula.  Pulmonary following.  Continue Solu-Medrol  and current nebs.  Respiratory panel PCR positive for rhinovirus - Rocephin  and doxycycline  being continued as per PCCM recommendations - Counseled regarding tobacco cessation.  Hypokalemia - Improved  Lactic acidosis - Resolved  Leukocytosis - Possibly worsening because of steroid use.  Hypertension - Monitor blood pressure.  Blood pressure is intermittently elevated.  Continue hydrochlorothiazide .  Use hydralazine  as needed.  Anxiety/depression - Continue escitalopram   Obesity class II - Outpatient follow-up  DVT prophylaxis: Lovenox  Code Status: Full Family Communication: None at bedside Disposition Plan: Status is: Inpatient Remains inpatient appropriate  because: Of severity of illness    Consultants: Pulmonary  Procedures: None  Antimicrobials:  Anti-infectives (From admission, onward)    Start     Dose/Rate Route Frequency Ordered Stop   05/18/24 1215  doxycycline  (VIBRA -TABS) tablet 100 mg        100 mg Oral Every 12 hours 05/18/24 1128     05/18/24 0315  cefTRIAXone  (ROCEPHIN ) 1 g in sodium chloride  0.9 % 100 mL IVPB        1 g 200 mL/hr over 30 Minutes Intravenous Every 24 hours 05/18/24 0313 05/23/24 0314        Subjective: Patient seen and examined at bedside.  Feels only slightly better but does not feel ready to go home yet.  Still short of breath with even minimal exertion.  No chest pain, vomiting or fever reported. Objective: Vitals:   05/20/24 0057 05/20/24 0400 05/20/24 0605 05/20/24 0805  BP:  (!) 185/97 (!) 154/71   Pulse: 91 69 74   Resp: (!) 26 14 16    Temp:  97.7 F (36.5 C) 98.3 F (36.8 C)   TempSrc:  Oral Oral   SpO2: 96%  90% 92%  Weight:      Height:        Intake/Output Summary (Last 24 hours) at 05/20/2024 0843 Last data filed at 05/19/2024 1643 Gross per 24 hour  Intake 363 ml  Output --  Net 363 ml   Filed Weights   05/18/24 0445  Weight: 87.3 kg    Examination:  General: On 4 L oxygen  by nasal cannula.  No distress ENT/neck: No thyromegaly.  JVD is not elevated  respiratory: Decreased breath sounds at bases bilaterally with diffuse wheezing but improving compared to yesterday  CVS: S1-S2 heard, rate  controlled currently Abdominal: Soft, obese, nontender, slightly distended; no organomegaly, bowel sounds are heard Extremities: Trace lower extremity edema; no cyanosis  CNS: Awake and alert.  No focal neurologic deficit.  Moves extremities Lymph: No obvious lymphadenopathy Skin: No obvious ecchymosis/lesions  psych: Mostly flat affect with no signs of agitation  musculoskeletal: No obvious joint swelling/deformity    Data Reviewed: I have personally reviewed following labs and  imaging studies  CBC: Recent Labs  Lab 05/18/24 0030 05/18/24 0550 05/19/24 0550 05/20/24 0533  WBC 14.7* 13.4* 16.0* 18.7*  NEUTROABS 9.6*  --   --   --   HGB 14.2 13.3 13.2 13.4  HCT 45.1 42.8 41.6 41.8  MCV 92.0 91.6 90.2 89.3  PLT 293 280 292 309   Basic Metabolic Panel: Recent Labs  Lab 05/18/24 0030 05/18/24 0550 05/19/24 0550 05/20/24 0533  NA 135 136 140 136  K 3.1* 3.4* 3.8 4.1  CL 93* 95* 94* 92*  CO2 29 23 32 34*  GLUCOSE 162* 249* 140* 106*  BUN 16 15 20  31*  CREATININE 1.07* 1.21* 1.02* 1.03*  CALCIUM  8.6* 8.4* 8.9 9.0  MG  --   --  2.6* 2.3   GFR: Estimated Creatinine Clearance: 54.4 mL/min (A) (by C-G formula based on SCr of 1.03 mg/dL (H)). Liver Function Tests: Recent Labs  Lab 05/18/24 0030  AST 17  ALT 11  ALKPHOS 79  BILITOT 0.7  PROT 6.6  ALBUMIN 3.4*   No results for input(s): LIPASE, AMYLASE in the last 168 hours. No results for input(s): AMMONIA in the last 168 hours. Coagulation Profile: No results for input(s): INR, PROTIME in the last 168 hours. Cardiac Enzymes: No results for input(s): CKTOTAL, CKMB, CKMBINDEX, TROPONINI in the last 168 hours. BNP (last 3 results) No results for input(s): PROBNP in the last 8760 hours. HbA1C: No results for input(s): HGBA1C in the last 72 hours. CBG: No results for input(s): GLUCAP in the last 168 hours. Lipid Profile: No results for input(s): CHOL, HDL, LDLCALC, TRIG, CHOLHDL, LDLDIRECT in the last 72 hours. Thyroid Function Tests: No results for input(s): TSH, T4TOTAL, FREET4, T3FREE, THYROIDAB in the last 72 hours. Anemia Panel: No results for input(s): VITAMINB12, FOLATE, FERRITIN, TIBC, IRON, RETICCTPCT in the last 72 hours. Sepsis Labs: Recent Labs  Lab 05/18/24 0040 05/18/24 0247 05/19/24 0550  PROCALCITON  --   --  <0.10  LATICACIDVEN 2.1* 1.0  --     Recent Results (from the past 240 hours)  Blood culture (routine x  2)     Status: None (Preliminary result)   Collection Time: 05/18/24 12:26 AM   Specimen: BLOOD  Result Value Ref Range Status   Specimen Description BLOOD BLOOD RIGHT ARM  Final   Special Requests   Final    BOTTLES DRAWN AEROBIC AND ANAEROBIC Blood Culture adequate volume   Culture   Final    NO GROWTH 1 DAY Performed at [facility], [address] [place], [place] [address]    Report Status PENDING  Incomplete  Resp panel by RT-PCR (RSV, Flu A&B, Covid)     Status: None   Collection Time: 05/18/24 12:27 AM   Specimen: Nasal Swab  Result Value Ref Range Status   SARS Coronavirus 2 by RT PCR NEGATIVE NEGATIVE Final   Influenza A by PCR NEGATIVE NEGATIVE Final   Influenza B by PCR NEGATIVE NEGATIVE Final    Comment: (NOTE) The Xpert Xpress SARS-CoV-2/FLU/RSV plus assay is intended as an aid in  the diagnosis of influenza from Nasopharyngeal swab specimens and should not be used as a sole basis for treatment. Nasal washings and aspirates are unacceptable for Xpert Xpress SARS-CoV-2/FLU/RSV testing.  Fact Sheet for Patients: BloggerCourse.com  Fact Sheet for Healthcare Providers: SeriousBroker.it  This test is not yet approved or cleared by the [place]  FDA and has been authorized for detection and/or diagnosis of SARS-CoV-2 by FDA under an Emergency Use Authorization (EUA). This EUA will remain in effect (meaning this test can be used) for the duration of the COVID-19 declaration under Section 564(b)(1) of the Act, 21 U.S.C. section 360bbb-3(b)(1), unless the authorization is terminated or revoked.     Resp Syncytial Virus by PCR NEGATIVE NEGATIVE Final    Comment: (NOTE) Fact Sheet for Patients: BloggerCourse.com  Fact Sheet for Healthcare Providers: SeriousBroker.it  This test is not yet approved or cleared by the [place]  FDA and has been authorized  for detection and/or diagnosis of SARS-CoV-2 by FDA under an Emergency Use Authorization (EUA). This EUA will remain in effect (meaning this test can be used) for the duration of the COVID-19 declaration under Section 564(b)(1) of the Act, 21 U.S.C. section 360bbb-3(b)(1), unless the authorization is terminated or revoked.  Performed at [facility], [address], [place], [place] [address]   Blood culture (routine x 2)     Status: None (Preliminary result)   Collection Time: 05/18/24 12:31 AM   Specimen: BLOOD  Result Value Ref Range Status   Specimen Description BLOOD BLOOD LEFT ARM  Final   Special Requests   Final    BOTTLES DRAWN AEROBIC AND ANAEROBIC Blood Culture adequate volume   Culture   Final    NO GROWTH 1 DAY Performed at [facility], [address] [place], [place] [address]    Report Status PENDING  Incomplete  Respiratory (~20 pathogens) panel by PCR     Status: Abnormal   Collection Time: 05/18/24  6:15 PM   Specimen: Nasopharyngeal Swab; Respiratory  Result Value Ref Range Status   Adenovirus NOT DETECTED NOT DETECTED Final   Coronavirus 229E NOT DETECTED NOT DETECTED Final   Coronavirus HKU1 NOT DETECTED NOT DETECTED Final   Coronavirus NL63 NOT DETECTED NOT DETECTED Final   Coronavirus OC43 NOT DETECTED NOT DETECTED Final   Metapneumovirus NOT DETECTED NOT DETECTED Final   Rhinovirus / Enterovirus DETECTED (A) NOT DETECTED Final   Influenza A NOT DETECTED NOT DETECTED Final   Influenza B NOT DETECTED NOT DETECTED Final   Parainfluenza Virus 1 NOT DETECTED NOT DETECTED Final   Parainfluenza Virus 2 NOT DETECTED NOT DETECTED Final   Parainfluenza Virus 3 NOT DETECTED NOT DETECTED Final   Parainfluenza Virus 4 NOT DETECTED NOT DETECTED Final   Respiratory Syncytial Virus NOT DETECTED NOT DETECTED Final   Bordetella pertussis NOT DETECTED NOT DETECTED Final   Bordetella Parapertussis NOT DETECTED NOT DETECTED Final   Chlamydophila pneumoniae  NOT DETECTED NOT DETECTED Final   Mycoplasma pneumoniae NOT DETECTED NOT DETECTED Final    Comment: Performed at [facility], [address], [place], [place] [address]  Expectorated Sputum Assessment w Gram Stain, Rflx to Resp Cult     Status: None   Collection Time: 05/18/24  6:30 PM   Specimen: Sputum  Result Value Ref Range Status   Specimen Description SPUTUM  Final   Special Requests NONE  Final   Sputum evaluation   Final    THIS SPECIMEN IS ACCEPTABLE FOR SPUTUM  CULTURE Performed at [facility], [address] [place], [place] [address]    Report Status 05/18/2024 FINAL  Final  Culture, Respiratory w Gram Stain     Status: None (Preliminary result)   Collection Time: 05/18/24  6:30 PM   Specimen: SPU  Result Value Ref Range Status   Specimen Description SPUTUM  Final   Special Requests NONE Reflexed from [id number]  Final   Gram Stain   Final    FEW WBC PRESENT, PREDOMINANTLY PMN FEW GRAM POSITIVE COCCI RARE GRAM NEGATIVE RODS    Culture   Final    TOO YOUNG TO READ Performed at [facility], [address] [place], [place] [address]    Report Status PENDING  Incomplete         Radiology Studies: VAS US  LOWER EXTREMITY VENOUS (DVT) Result Date: 05/18/2024  Lower Venous DVT Study Patient Name:  [name]  Date of Exam:   05/18/2024 Medical Rec #: [id number]      Accession #:    [id number] Date of Birth: 08-07-1956     Patient Gender: F Patient Age:   10 years Exam Location:  [facility] Procedure:      VAS US  LOWER EXTREMITY VENOUS (DVT) Referring Phys: PAULA SIMPSON --------------------------------------------------------------------------------  Indications: SOB, and CHF. Other Indications: Chronic hypoxic respiratory failure. Comparison Study: No priors. Performing Technologist: Ricka Sturdivant-Jones RDMS, RVT  Examination Guidelines: A complete evaluation includes B-mode imaging, spectral Doppler, color Doppler, and power Doppler as needed of  all accessible portions of each vessel. Bilateral testing is considered an integral part of a complete examination. Limited examinations for reoccurring indications may be performed as noted. The reflux portion of the exam is performed with the patient in reverse Trendelenburg.  +---------+---------------+---------+-----------+----------+--------------+ RIGHT    CompressibilityPhasicitySpontaneityPropertiesThrombus Aging +---------+---------------+---------+-----------+----------+--------------+ CFV      Full           Yes      Yes                                 +---------+---------------+---------+-----------+----------+--------------+ SFJ      Full                                                        +---------+---------------+---------+-----------+----------+--------------+ FV Prox  Full                                                        +---------+---------------+---------+-----------+----------+--------------+ FV Mid   Full           Yes      Yes                                 +---------+---------------+---------+-----------+----------+--------------+ FV DistalFull                                                        +---------+---------------+---------+-----------+----------+--------------+  PFV      Full                                                        +---------+---------------+---------+-----------+----------+--------------+ POP      Full           Yes      Yes                                 +---------+---------------+---------+-----------+----------+--------------+ PTV      Full                                                        +---------+---------------+---------+-----------+----------+--------------+ PERO     Full                                                        +---------+---------------+---------+-----------+----------+--------------+    +---------+---------------+---------+-----------+----------+--------------+ LEFT     CompressibilityPhasicitySpontaneityPropertiesThrombus Aging +---------+---------------+---------+-----------+----------+--------------+ CFV      Full           Yes      Yes                                 +---------+---------------+---------+-----------+----------+--------------+ SFJ      Full                                                        +---------+---------------+---------+-----------+----------+--------------+ FV Prox  Full                                                        +---------+---------------+---------+-----------+----------+--------------+ FV Mid   Full           Yes      Yes                                 +---------+---------------+---------+-----------+----------+--------------+ FV DistalFull                                                        +---------+---------------+---------+-----------+----------+--------------+ PFV      Full                                                        +---------+---------------+---------+-----------+----------+--------------+  POP      Full           Yes      Yes                                 +---------+---------------+---------+-----------+----------+--------------+ PTV      Full                                                        +---------+---------------+---------+-----------+----------+--------------+ PERO     Full                                                        +---------+---------------+---------+-----------+----------+--------------+     Summary: BILATERAL: - No evidence of deep vein thrombosis seen in the lower extremities, bilaterally. -No evidence of popliteal cyst, bilaterally.   *See table(s) above for measurements and observations. Electronically signed by Gaile New MD on 05/18/2024 at 8:26:24 PM.    Final         Scheduled Meds:  aspirin   81 mg Oral q morning    budesonide  (PULMICORT ) nebulizer solution  0.5 mg Nebulization BID   doxycycline   100 mg Oral Q12H   enoxaparin  (LOVENOX ) injection  40 mg Subcutaneous Daily   escitalopram   10 mg Oral QHS   hydrochlorothiazide   25 mg Oral q morning   ipratropium-albuterol   3 mL Nebulization Q6H   methylPREDNISolone  (SOLU-MEDROL ) injection  40 mg Intravenous Q12H   nicotine   14 mg Transdermal Daily   sodium chloride  flush  3 mL Intravenous Q12H   Continuous Infusions:  cefTRIAXone  (ROCEPHIN )  IV 1 g (05/20/24 0413)          Sophie Mao, MD [facility] 05/20/2024, 8:43 AM

## 2024-05-21 ENCOUNTER — Telehealth: Payer: Self-pay | Admitting: Pulmonary Disease

## 2024-05-21 ENCOUNTER — Other Ambulatory Visit (HOSPITAL_COMMUNITY): Payer: Self-pay

## 2024-05-21 DIAGNOSIS — J441 Chronic obstructive pulmonary disease with (acute) exacerbation: Secondary | ICD-10-CM | POA: Diagnosis not present

## 2024-05-21 LAB — BASIC METABOLIC PANEL WITH GFR
Anion gap: 9 (ref 5–15)
BUN: 32 mg/dL — ABNORMAL HIGH (ref 8–23)
CO2: 31 mmol/L (ref 22–32)
Calcium: 9 mg/dL (ref 8.9–10.3)
Chloride: 95 mmol/L — ABNORMAL LOW (ref 98–111)
Creatinine, Ser: 1.01 mg/dL — ABNORMAL HIGH (ref 0.44–1.00)
GFR, Estimated: >60 mL/min (ref >60)
Glucose, Bld: 121 mg/dL — ABNORMAL HIGH (ref 70–99)
Potassium: 4.1 mmol/L (ref 3.5–5.1)
Sodium: 135 mmol/L (ref 135–145)

## 2024-05-21 LAB — CBC WITH DIFFERENTIAL/PLATELET
Abs Immature Granulocytes: 0.68 K/uL — ABNORMAL HIGH (ref 0.00–0.07)
Basophils Absolute: 0.1 K/uL (ref 0.0–0.1)
Basophils Relative: 0 %
Eosinophils Absolute: 0.1 K/uL (ref 0.0–0.5)
Eosinophils Relative: 0 %
HCT: 42.4 % (ref 36.0–46.0)
Hemoglobin: 13.7 g/dL (ref 12.0–15.0)
Immature Granulocytes: 3 %
Lymphocytes Relative: 15 %
Lymphs Abs: 3 K/uL (ref 0.7–4.0)
MCH: 28.8 pg (ref 26.0–34.0)
MCHC: 32.3 g/dL (ref 30.0–36.0)
MCV: 89.1 fL (ref 80.0–100.0)
Monocytes Absolute: 0.8 K/uL (ref 0.1–1.0)
Monocytes Relative: 4 %
Neutro Abs: 15.9 K/uL — ABNORMAL HIGH (ref 1.7–7.7)
Neutrophils Relative %: 78 %
Platelets: 325 K/uL (ref 150–400)
RBC: 4.76 MIL/uL (ref 3.87–5.11)
RDW: 13.2 % (ref 11.5–15.5)
WBC: 20.5 K/uL — ABNORMAL HIGH (ref 4.0–10.5)
nRBC: 0 % (ref 0.0–0.2)

## 2024-05-21 LAB — ECHOCARDIOGRAM COMPLETE
Area-P 1/2: 3.6 cm2
S' Lateral: 2.6 cm

## 2024-05-21 LAB — MAGNESIUM: Magnesium: 2.2 mg/dL (ref 1.7–2.4)

## 2024-05-21 LAB — CULTURE, RESPIRATORY W GRAM STAIN: Culture: NORMAL

## 2024-05-21 MED ORDER — PREDNISONE 20 MG PO TABS: 20.0000 mg | ORAL_TABLET | Freq: Every day | ORAL | Status: DC

## 2024-05-21 MED ORDER — PREDNISONE 10 MG PO TABS: 10.0000 mg | ORAL_TABLET | Freq: Every day | ORAL | Status: DC

## 2024-05-21 MED ORDER — PREDNISONE 20 MG PO TABS: 40.0000 mg | ORAL_TABLET | Freq: Every day | ORAL | Status: DC

## 2024-05-21 MED ORDER — PREDNISONE 5 MG PO TABS
ORAL_TABLET | ORAL | 0 refills | Status: AC
  Filled 2024-05-21: qty 63, 15d supply, fill #0

## 2024-05-21 MED ORDER — PREDNISONE 20 MG PO TABS: 30.0000 mg | ORAL_TABLET | Freq: Every day | ORAL | Status: DC

## 2024-05-21 MED ORDER — PREDNISONE 5 MG PO TABS: 5.0000 mg | ORAL_TABLET | Freq: Every day | ORAL | Status: DC

## 2024-05-21 MED ORDER — DOXYCYCLINE HYCLATE 100 MG PO TABS
100.0000 mg | ORAL_TABLET | Freq: Two times a day (BID) | ORAL | 0 refills | Status: AC
  Filled 2024-05-21: qty 6, 3d supply, fill #0

## 2024-05-21 NOTE — Progress Notes (Signed)
 Discharge Nurse Summary: DC order noted per MD. DC RN at bedside with patient. Patient agreeable with discharge plan, states friend will arrive soon for pickup.   AVS printed/reviewed. PIV removed, skin intact. No DME needs. No home meds. TOC meds delivered to the patient. CP/Edu resolved. Telemonitor not present on assessment. All belongings accounted for. Patient wheeled downstairs for discharge by private auto with friend. Oxygen  safely exchanged from inpatient to home oxygen  tank on 4LNC.   Rosario EMERSON Lund, RN

## 2024-05-21 NOTE — Progress Notes (Addendum)
 Nurse requested Mobility Specialist to perform oxygen  saturation test with pt which includes removing pt from oxygen  both at rest and while ambulating.  Below are the results from that testing.     Patient Saturations on Room Air at Rest = spO2 93%  Patient Saturations on Room Air while Ambulating = sp02 87% .    Patient Saturations on 4 Liters of oxygen  while Ambulating = sp02 92%  At end of testing pt left in room on 2.5  Liters of oxygen .  Reported results to nurse.    Lauraine Erm Mobility Specialist Please contact via SecureChat or [employer] [phone]

## 2024-05-21 NOTE — Plan of Care (Signed)

## 2024-05-21 NOTE — Progress Notes (Signed)
 NAME:  [name], MRN:  [id number], DOB:  Jun 14, 1956, LOS: 3 ADMISSION DATE:  05/18/2024, CONSULTATION DATE:  05/18/24 REFERRING MD:  Dr. Cheryle, CHIEF COMPLAINT:  SOB   History of Present Illness:   39 yoF with PMH as below significant for ongoing tobacco abuse (up to 10 cigarettes/ day), COPD, chronic hypoxic respiratory failure, and HTN admitted overnight by [facility] for AECOPD.  Pt reports she has had worsening cough, chills, decreased appetite, greenish productive sputum, and needing her home oxygen  intermittently for the last 3 weeks.  Became sick again Friday and progressively SOB, noted her O2 sats in the 70's at home and called EMS.  Has been exposed to sick family member ~1 week ago and recently traveled to [place]  1 week ago by car.  Has completed 2 rounds of antibiotics of azithromycin  and steroids previously. Did feel better and cough improved after these treatments.  Denies any chest pain, dizziness, syncope, leg swelling or pain, or N/V.  Has home oxygen  in which she used to use daily, then nocturnal and now only as needed.  Has nebs at home but does not use any nebs/ inhalers on a daily use, only when sick or with allergies.  Reports 3 flare ups with her breathing since January but overall has been doing better over last several years with last hospitalization was 12/2021 for AECOPD.  Last seen in pulmonary clinic by Dr. Shelah 09/2017 and since managed by her PCP.    Initially requiring NRB with EMS since weaned down to 6L [place], found in ER to be afebrile, WBC 14.7, initial lactic 2.1> 1, BNP 115, COVID/ RSV/ flu neg, and CXR showing cardiomegaly and vascular congestion.  Treated with solumedrol, nebs, and started on ceftriaxone .  Reported ongoing dyspnea and O2 requirement still requiring 6L despite treatment.  Pt does report improvement with appetite this am.  ABG 7.37/ 57/ 57/ 33.  Remains hemodynamically stable.  Pulmonary consulted for further recommendations.    Pertinent  Medical History    Past Medical History:  Diagnosis Date   COPD with asthma (HCC)    currently followed by pcp (previous as seen pulmonogist --- dr byrum lov note in epic 10-04-2017),  last exacerbation with pneumonia 04/ 2022 pt stated completed antibiotic , no residual)   DOE (dyspnea on exertion)    02-15-2021  per pt only with hills/ stairs , no issue with other activities   History of hyperthyroidism    s/p  RAI 12/ 2013,  followed by pcp   Hypertension    Nocturnal hypoxemia    uses 2L oxygen  at night only,  (02-15-2021 pt stated O2 sat can get down 70--88%,  stated during the day O2 sat on RA are normal)   Nocturnal leg cramps    occasional   RLS (restless legs syndrome)    Wears dentures    upper   Significant Hospital Events: Including procedures, antibiotic start and stop dates in addition to other pertinent events     Interim History / Subjective:   No acute issues overnight She is feeling much better this morning, cough has improved.   She does not need oxygen  at rest. With ambulation she desaturated to 87% on room air. Placed on 4L O2 to maintain SpO2 92% or greater.   She has home oxygen  concentrator and tanks.   Objective    Blood pressure (!) 171/96, pulse 90, temperature 98.4 F (36.9 C), temperature source Oral, resp. rate 18, height 5' 2 (1.575 m), weight  87.3 kg, SpO2 95%.        Intake/Output Summary (Last 24 hours) at 05/21/2024 0756 Last data filed at 05/20/2024 1700 Gross per 24 hour  Intake 100 ml  Output --  Net 100 ml   Filed Weights   05/18/24 0445  Weight: 87.3 kg    Examination: General: no acute distress, sitting up at bedside HEENT: MM pink/moist Neuro: AO, appropriate, MAE CV: rr, no murmur PULM:  no wheezing, improved aeration Extremities: warm/dry, no pitting tibial edema, no calf tenderness   Resolved problem list   Assessment and Plan   Acute on chronic hypoxic and hypercarbic respiratory failure secondary to  COPD with acute  exacerbation, rhinovirus + Tobacco abuse  - She received 40mg  IV solumedrol this morning - Transition to steroid taper: 40mg  prednisone  daily x 3 days, 30mg  daily x 3 days, 20mg  daily x 3 days, 10mg  daily x 3 days and 5 mg daily x 3 days - continue nebulizer treatments, ensure she has duoneb prescription at discharge - Increase maintenance inhaler to symbicort  160-4.30mcg 2 puffs twice daily - continue ceftriaxone  and doxycycline , can complete course of doxycycline  as outpatient - She is to continue supplemental oxygen  with sleep and ambulation. She requires 4L continuous with ambulation at this time. At rest she can be on room air. - Echo pending - tobacco cessation counseling as appropriate  - will arrange follow up in clinic  PCCM will sign off  Labs   CBC: Recent Labs  Lab 05/18/24 0030 05/18/24 0550 05/19/24 0550 05/20/24 0533 05/21/24 0426  WBC 14.7* 13.4* 16.0* 18.7* 20.5*  NEUTROABS 9.6*  --   --   --  15.9*  HGB 14.2 13.3 13.2 13.4 13.7  HCT 45.1 42.8 41.6 41.8 42.4  MCV 92.0 91.6 90.2 89.3 89.1  PLT 293 280 292 309 325    Basic Metabolic Panel: Recent Labs  Lab 05/18/24 0030 05/18/24 0550 05/19/24 0550 05/20/24 0533 05/21/24 0426  NA 135 136 140 136 135  K 3.1* 3.4* 3.8 4.1 4.1  CL 93* 95* 94* 92* 95*  CO2 29 23 32 34* 31  GLUCOSE 162* 249* 140* 106* 121*  BUN 16 15 20  31* 32*  CREATININE 1.07* 1.21* 1.02* 1.03* 1.01*  CALCIUM  8.6* 8.4* 8.9 9.0 9.0  MG  --   --  2.6* 2.3 2.2   GFR: Estimated Creatinine Clearance: 55.5 mL/min (A) (by C-G formula based on SCr of 1.01 mg/dL (H)). Recent Labs  Lab 05/18/24 0040 05/18/24 0247 05/18/24 0550 05/19/24 0550 05/20/24 0533 05/21/24 0426  PROCALCITON  --   --   --  <0.10  --   --   WBC  --   --  13.4* 16.0* 18.7* 20.5*  LATICACIDVEN 2.1* 1.0  --   --   --   --     Liver Function Tests: Recent Labs  Lab 05/18/24 0030  AST 17  ALT 11  ALKPHOS 79  BILITOT 0.7  PROT 6.6  ALBUMIN 3.4*   No results  for input(s): LIPASE, AMYLASE in the last 168 hours. No results for input(s): AMMONIA in the last 168 hours.  ABG    Component Value Date/Time   PHART 7.37 05/18/2024 0830   PCO2ART 57 (H) 05/18/2024 0830   PO2ART 57 (L) 05/18/2024 0830   HCO3 33.0 (H) 05/18/2024 0830   TCO2 28 02/16/2021 0727   O2SAT 89.5 05/18/2024 0830     Coagulation Profile: No results for input(s): INR, PROTIME in the last 168  hours.  Cardiac Enzymes: No results for input(s): CKTOTAL, CKMB, CKMBINDEX, TROPONINI in the last 168 hours.  HbA1C: No results found for: HGBA1C  CBG: No results for input(s): GLUCAP in the last 168 hours.    Critical care time: n/a    Dorn Chill, MD [facility] Office: [phone]   See Amion for personal pager PCCM on call pager [phone] until 7pm. Please call Elink 7p-7a. [phone]

## 2024-05-21 NOTE — Progress Notes (Signed)
 Mobility Specialist Progress Note;   05/21/24 1202  Mobility  Activity Ambulated independently  Level of Assistance Independent after set-up  Assistive Device None  Distance Ambulated (ft) 200 ft  Activity Response Tolerated well  Mobility Referral Yes  Mobility visit 1 Mobility  Mobility Specialist Start Time (ACUTE ONLY) 1202  Mobility Specialist Stop Time (ACUTE ONLY) 1211  Mobility Specialist Time Calculation (min) (ACUTE ONLY) 9 min   Pt agreeable to mobility and walking O2 test. Found on 2.5LO2, able to maintain SPO2 well at rest. Attempted ambulation on RA, however SPO2 desat to 87%, requiring up to 4LO2 to maintain SPO2 92%>. Required no physical assistance. Pt returned back to sitting on EoB and left with all needs met.   Lauraine Erm Mobility Specialist Please contact via SecureChat or [employer] [phone]

## 2024-05-21 NOTE — Discharge Summary (Addendum)
 Physician Discharge Summary  [name] FMW:[id number] DOB: 1955-12-29 DOA: 05/18/2024  PCP: Celestia Rosaline SQUIBB, NP  Admit date: 05/18/2024 Discharge date: 05/21/2024 30 Day Unplanned Readmission Risk Score    Flowsheet Row ED to Hosp-Admission (Current) from 05/18/2024 in [facility]  30 Day Unplanned Readmission Risk Score (%) 13.64 Filed at 05/21/2024 1200    This score is the patient's risk of an unplanned readmission within 30 days of being discharged (0 -100%). The score is based on dignosis, age, lab data, medications, orders, and past utilization.   Low:  0-14.9   Medium: 15-21.9   High: 22-29.9   Extreme: 30 and above          Admitted From: Home Disposition: Home  Recommendations for Outpatient Follow-up:  Follow up with PCP in 1-2 weeks Please obtain BMP/CBC in one week Follow-up with pulmonology in 2 weeks. Please follow up with your PCP on the following pending results: Unresulted Labs (From admission, onward)     Start     Ordered   05/25/24 0500  Creatinine, serum  (enoxaparin  (LOVENOX )    CrCl >/= 30 ml/min)  Weekly,   R     Comments: while on enoxaparin  therapy    05/18/24 0313   05/20/24 0500  Magnesium   Daily,   R     Question:  Specimen collection method  Answer:  Lab=Lab collect   05/19/24 1048              Home Health: None Equipment/Devices: Home oxygen   Discharge Condition: Stable CODE STATUS: Full code Diet recommendation:  Diet Order             Diet regular Room service appropriate? Yes; Fluid consistency: Thin  Diet effective now                   Subjective: Seen and examined, when I entered the room, she is at  I feel great and ready to go home she had no other complaints.  Discussed with her that I was waiting for echo reports.  She was okay with staying until noon but she wanted to go home early afternoon.  Strangely, echo was done 3 days ago on 05/18/2024, has not been read yet.  While I am still trying  to get a hold of the cardiologist to request urgent reading, patient is not willing to wait any longer so I am discharging her home per her request.  Will defer to PCP or pulmonology to follow-up with echo results.  Brief/Interim Summary: 68 y.o. female with medical history significant for hypertension, depression, anxiety, COPD, and chronic hypoxic respiratory failure presented with cough and worsening shortness of breath.  She was diagnosed with pneumonia 3 weeks ago and was treated with antibiotics.  Subsequently she has completed 2 different antibiotics and IV/steroids.  On presentation, she was hypoxic and in respiratory distress requiring 15 L supplemental oxygen .  She was given IV Solu-Medrol  and nebs.  In the ED, she was placed on 6 L oxygen .  WBC of 14,700, lactic acid of 2.1.  COVID/influenza/RSV PCR negative.  Chest x-ray showed cardiomegaly and vascular congestion.  Pulmonary was consulted.   Acute on chronic respiratory failure with hypoxia and hypercapnia COPD exacerbation due to rhinovirus Tobacco use - Patient continues to smoke as an outpatient.  She is on 3 L oxygen  via nasal cannula at home at baseline.  Was in significant respiratory distress on presentation requiring 15 L supplemental oxygen .  Respiratory status  improving.  Currently back down to her 3 L/baseline oxygen  requirement.  Pulmonary followed.  They have cleared her for discharge.  She is feeling much better.  She will resume home inhalers.  They have recommended slow tapering of prednisone  as below.  Addendum: Echo was just completed by cardiology after my request.  Following is the result.  Recommend follow-up with cardiology as outpatient. IMPRESSIONS     1. Left ventricular ejection fraction, by estimation, is 70 to 75%. Left  ventricular ejection fraction by PLAX is 72 %. The left ventricle has  hyperdynamic function. The left ventricle has no regional wall motion  abnormalities. There is mild left  ventricular  hypertrophy. Left ventricular diastolic parameters are  consistent with Grade I diastolic dysfunction (impaired relaxation).   2. Right ventricular systolic function is normal. The right ventricular  size is normal. There is mildly elevated pulmonary artery systolic  pressure. The estimated right ventricular systolic pressure is 42.1 mmHg.   3. The mitral valve is grossly normal. Trivial mitral valve  regurgitation.   4. The aortic valve is tricuspid. Aortic valve regurgitation is not  visualized. Aortic valve sclerosis is present, with no evidence of aortic  valve stenosis.   5. The inferior vena cava is dilated in size with >50% respiratory  variability, suggesting right atrial pressure of 8 mmHg.   Hypokalemia - Improved   Lactic acidosis - Resolved   Leukocytosis - Possibly worsening because of steroid use.   Hypertension - Monitor blood pressure.  Blood pressure is intermittently elevated.  Continue hydrochlorothiazide .    Anxiety/depression - Continue escitalopram    Obesity class II - Outpatient follow-up  Discharge plan was discussed with patient and/or family member and they verbalized understanding and agreed with it.  Discharge Diagnoses:  Principal Problem:   COPD exacerbation (HCC) Active Problems:   Acute on chronic respiratory failure with hypoxia (HCC)   DEPRESSION/ANXIETY   HYPERTENSION, BENIGN ESSENTIAL   Hypokalemia    Discharge Instructions   Allergies as of 05/21/2024       Reactions   Trazodone  And Nefazodone Other (See Comments)   Suicidal thoughts, hallucinations.        Medication List     STOP taking these medications    azithromycin  500 MG tablet Commonly known as: ZITHROMAX        TAKE these medications    Adult One Daily Gummies Chew Chew 2 each by mouth every morning.   albuterol  (5 MG/ML) 0.5% nebulizer solution Commonly known as: PROVENTIL  Take 0.5 mLs (2.5 mg total) by nebulization every 4 (four) hours while awake  for 3 days, THEN 0.5 mLs (2.5 mg total) every 4 (four) hours as needed for wheezing or shortness of breath. Start taking on: July 21, 2021 What changed: See the new instructions.   albuterol  108 (90 Base) MCG/ACT inhaler Commonly known as: VENTOLIN  HFA Inhale 2 puffs into the lungs every 6 (six) hours as needed for wheezing or shortness of breath. What changed: Another medication with the same name was changed. Make sure you understand how and when to take each.   aspirin  81 MG chewable tablet Chew 81 mg by mouth every morning.   CALCIUM /VITAMIN D3/ADULT GUMMY PO Take 2 each by mouth every morning.   doxycycline  100 MG tablet Commonly known as: VIBRA -TABS Take 1 tablet (100 mg total) by mouth 2 (two) times daily for 3 days.   escitalopram  10 MG tablet Commonly known as: LEXAPRO  Take 10 mg by mouth at bedtime.   fluticasone   50 MCG/ACT nasal spray Commonly known as: FLONASE  Place 2 sprays into both nostrils daily as needed for allergies or rhinitis.   hydrochlorothiazide  25 MG tablet Commonly known as: HYDRODIURIL  Take 25 mg by mouth every morning.   ibuprofen 800 MG tablet Commonly known as: ADVIL Take 800 mg by mouth every 8 (eight) hours as needed for moderate pain.   levocetirizine 5 MG tablet Commonly known as: XYZAL Take 5 mg by mouth daily.   omeprazole 40 MG capsule Commonly known as: PRILOSEC Take 40 mg by mouth 2 (two) times daily as needed (heartburn, indigestion).   OXYGEN  Inhale 3 L into the lungs 2 (two) times daily as needed (Respiratory symptoms).   predniSONE  5 MG tablet Commonly known as: DELTASONE  Take 8 tablets (40 mg total) by mouth daily with breakfast for 3 days, THEN 6 tablets (30 mg total) daily with breakfast for 3 days, THEN 4 tablets (20 mg total) daily with breakfast for 3 days, THEN 2 tablets (10 mg total) daily with breakfast for 3 days, THEN 1 tablet (5 mg total) daily with breakfast for 3 days. Start taking on: May 22, 2024    Symbicort  80-4.5 MCG/ACT inhaler Generic drug: budesonide -formoterol  Inhale 2 puffs into the lungs 2 (two) times daily as needed (Respiratory symptoms).        Follow-up Information     Celestia Rosaline SQUIBB, NP Follow up in 1 week(s).   Specialty: Internal Medicine Contact information: [address] [place] [address] [phone]                Allergies  Allergen Reactions   Trazodone  And Nefazodone Other (See Comments)    Suicidal thoughts, hallucinations.    Consultations: Pulmonology   Procedures/Studies: VAS US  LOWER EXTREMITY VENOUS (DVT) Result Date: 05/18/2024  Lower Venous DVT Study Patient Name:  Kasee Arno  Date of Exam:   05/18/2024 Medical Rec #: [id number]      Accession #:    [id number] Date of Birth: [birth year]     Patient Gender: F Patient Age:   68 years Exam Location:  [facility] Procedure:      VAS US  LOWER EXTREMITY VENOUS (DVT) Referring Phys: PAULA SIMPSON --------------------------------------------------------------------------------  Indications: SOB, and CHF. Other Indications: Chronic hypoxic respiratory failure. Comparison Study: No priors. Performing Technologist: Ricka Sturdivant-Jones RDMS, RVT  Examination Guidelines: A complete evaluation includes B-mode imaging, spectral Doppler, color Doppler, and power Doppler as needed of all accessible portions of each vessel. Bilateral testing is considered an integral part of a complete examination. Limited examinations for reoccurring indications may be performed as noted. The reflux portion of the exam is performed with the patient in reverse Trendelenburg.  +---------+---------------+---------+-----------+----------+--------------+ RIGHT    CompressibilityPhasicitySpontaneityPropertiesThrombus Aging +---------+---------------+---------+-----------+----------+--------------+ CFV      Full           Yes      Yes                                  +---------+---------------+---------+-----------+----------+--------------+ SFJ      Full                                                        +---------+---------------+---------+-----------+----------+--------------+ FV Prox  Full                                                        +---------+---------------+---------+-----------+----------+--------------+  FV Mid   Full           Yes      Yes                                 +---------+---------------+---------+-----------+----------+--------------+ FV DistalFull                                                        +---------+---------------+---------+-----------+----------+--------------+ PFV      Full                                                        +---------+---------------+---------+-----------+----------+--------------+ POP      Full           Yes      Yes                                 +---------+---------------+---------+-----------+----------+--------------+ PTV      Full                                                        +---------+---------------+---------+-----------+----------+--------------+ PERO     Full                                                        +---------+---------------+---------+-----------+----------+--------------+   +---------+---------------+---------+-----------+----------+--------------+ LEFT     CompressibilityPhasicitySpontaneityPropertiesThrombus Aging +---------+---------------+---------+-----------+----------+--------------+ CFV      Full           Yes      Yes                                 +---------+---------------+---------+-----------+----------+--------------+ SFJ      Full                                                        +---------+---------------+---------+-----------+----------+--------------+ FV Prox  Full                                                         +---------+---------------+---------+-----------+----------+--------------+ FV Mid   Full           Yes      Yes                                 +---------+---------------+---------+-----------+----------+--------------+  FV DistalFull                                                        +---------+---------------+---------+-----------+----------+--------------+ PFV      Full                                                        +---------+---------------+---------+-----------+----------+--------------+ POP      Full           Yes      Yes                                 +---------+---------------+---------+-----------+----------+--------------+ PTV      Full                                                        +---------+---------------+---------+-----------+----------+--------------+ PERO     Full                                                        +---------+---------------+---------+-----------+----------+--------------+     Summary: BILATERAL: - No evidence of deep vein thrombosis seen in the lower extremities, bilaterally. -No evidence of popliteal cyst, bilaterally.   *See table(s) above for measurements and observations. Electronically signed by Gaile New MD on 05/18/2024 at 8:26:24 PM.    Final    DG Chest Portable 1 View Result Date: 05/18/2024 CLINICAL DATA:  Shortness of breath EXAM: PORTABLE CHEST 1 VIEW COMPARISON:  01/13/2022 FINDINGS: Mild cardiomegaly, vascular congestion. No overt edema, confluent opacities or effusions. No acute bony abnormality. IMPRESSION: Cardiomegaly, vascular congestion. Electronically Signed   By: Franky Crease M.D.   On: 05/18/2024 00:54     Discharge Exam: Vitals:   05/21/24 0815 05/21/24 1204  BP: (!) 144/66 (!) 143/81  Pulse: 64 72  Resp: 20 20  Temp: 98 F (36.7 C) 97.7 F (36.5 C)  SpO2: 93% 95%   Vitals:   05/21/24 0425 05/21/24 0443 05/21/24 0815 05/21/24 1204  BP: (!) 169/94 (!) 171/96 (!) 144/66  (!) 143/81  Pulse: 90  64 72  Resp: 18  20 20   Temp: 98.4 F (36.9 C)  98 F (36.7 C) 97.7 F (36.5 C)  TempSrc: Oral  Oral Oral  SpO2: 95%  93% 95%  Weight:      Height:        General: Pt is alert, awake, not in acute distress Cardiovascular: RRR, S1/S2 +, no rubs, no gallops Respiratory: CTA bilaterally, no wheezing, no rhonchi Abdominal: Soft, NT, ND, bowel sounds + Extremities: no edema, no cyanosis    The results of significant diagnostics from this hospitalization (including imaging, microbiology, ancillary and laboratory) are listed below for reference.     Microbiology: Recent Results (from the past 240 hours)  Blood culture (routine x 2)     Status: None (Preliminary result)   Collection Time: 05/18/24 12:26 AM   Specimen: BLOOD  Result Value Ref Range Status   Specimen Description BLOOD BLOOD RIGHT ARM  Final   Special Requests   Final    BOTTLES DRAWN AEROBIC AND ANAEROBIC Blood Culture adequate volume   Culture   Final    NO GROWTH 3 DAYS Performed at [facility], [address] [place], [place] [address]    Report Status PENDING  Incomplete  Resp panel by RT-PCR (RSV, Flu A&B, Covid)     Status: None   Collection Time: 05/18/24 12:27 AM   Specimen: Nasal Swab  Result Value Ref Range Status   SARS Coronavirus 2 by RT PCR NEGATIVE NEGATIVE Final   Influenza A by PCR NEGATIVE NEGATIVE Final   Influenza B by PCR NEGATIVE NEGATIVE Final    Comment: (NOTE) The Xpert Xpress SARS-CoV-2/FLU/RSV plus assay is intended as an aid in the diagnosis of influenza from Nasopharyngeal swab specimens and should not be used as a sole basis for treatment. Nasal washings and aspirates are unacceptable for Xpert Xpress SARS-CoV-2/FLU/RSV testing.  Fact Sheet for Patients: BloggerCourse.com  Fact Sheet for Healthcare Providers: SeriousBroker.it  This test is not yet approved or cleared by the [place]  FDA  and has been authorized for detection and/or diagnosis of SARS-CoV-2 by FDA under an Emergency Use Authorization (EUA). This EUA will remain in effect (meaning this test can be used) for the duration of the COVID-19 declaration under Section 564(b)(1) of the Act, 21 U.S.C. section 360bbb-3(b)(1), unless the authorization is terminated or revoked.     Resp Syncytial Virus by PCR NEGATIVE NEGATIVE Final    Comment: (NOTE) Fact Sheet for Patients: BloggerCourse.com  Fact Sheet for Healthcare Providers: SeriousBroker.it  This test is not yet approved or cleared by the [place]  FDA and has been authorized for detection and/or diagnosis of SARS-CoV-2 by FDA under an Emergency Use Authorization (EUA). This EUA will remain in effect (meaning this test can be used) for the duration of the COVID-19 declaration under Section 564(b)(1) of the Act, 21 U.S.C. section 360bbb-3(b)(1), unless the authorization is terminated or revoked.  Performed at [facility], [address], [place], [place] [address]   Blood culture (routine x 2)     Status: None (Preliminary result)   Collection Time: 05/18/24 12:31 AM   Specimen: BLOOD  Result Value Ref Range Status   Specimen Description BLOOD BLOOD LEFT ARM  Final   Special Requests   Final    BOTTLES DRAWN AEROBIC AND ANAEROBIC Blood Culture adequate volume   Culture   Final    NO GROWTH 3 DAYS Performed at [facility], [address] [place], [place] [address]    Report Status PENDING  Incomplete  Respiratory (~20 pathogens) panel by PCR     Status: Abnormal   Collection Time: 05/18/24  6:15 PM   Specimen: Nasopharyngeal Swab; Respiratory  Result Value Ref Range Status   Adenovirus NOT DETECTED NOT DETECTED Final   Coronavirus 229E NOT DETECTED NOT DETECTED Final   Coronavirus HKU1 NOT DETECTED NOT DETECTED Final   Coronavirus NL63 NOT DETECTED NOT DETECTED Final    Coronavirus OC43 NOT DETECTED NOT DETECTED Final   Metapneumovirus NOT DETECTED NOT DETECTED Final   Rhinovirus / Enterovirus DETECTED (A) NOT DETECTED Final   Influenza A NOT DETECTED NOT DETECTED Final   Influenza B NOT  DETECTED NOT DETECTED Final   Parainfluenza Virus 1 NOT DETECTED NOT DETECTED Final   Parainfluenza Virus 2 NOT DETECTED NOT DETECTED Final   Parainfluenza Virus 3 NOT DETECTED NOT DETECTED Final   Parainfluenza Virus 4 NOT DETECTED NOT DETECTED Final   Respiratory Syncytial Virus NOT DETECTED NOT DETECTED Final   Bordetella pertussis NOT DETECTED NOT DETECTED Final   Bordetella Parapertussis NOT DETECTED NOT DETECTED Final   Chlamydophila pneumoniae NOT DETECTED NOT DETECTED Final   Mycoplasma pneumoniae NOT DETECTED NOT DETECTED Final    Comment: Performed at [facility], [address] [place], [place] [address]  Expectorated Sputum Assessment w Gram Stain, Rflx to Resp Cult     Status: None   Collection Time: 05/18/24  6:30 PM   Specimen: Sputum  Result Value Ref Range Status   Specimen Description SPUTUM  Final   Special Requests NONE  Final   Sputum evaluation   Final    THIS SPECIMEN IS ACCEPTABLE FOR SPUTUM CULTURE Performed at [facility], [address], [place], [place] [address]    Report Status 05/18/2024 FINAL  Final  Culture, Respiratory w Gram Stain     Status: None (Preliminary result)   Collection Time: 05/18/24  6:30 PM   Specimen: SPU  Result Value Ref Range Status   Specimen Description SPUTUM  Final   Special Requests NONE Reflexed from [id number]  Final   Gram Stain   Final    FEW WBC PRESENT, PREDOMINANTLY PMN FEW GRAM POSITIVE COCCI RARE GRAM NEGATIVE RODS    Culture   Final    CULTURE REINCUBATED FOR BETTER GROWTH Performed at [facility], [address], [place], [place] [address]    Report Status PENDING  Incomplete     Labs: BNP (last 3 results) Recent Labs    05/18/24 0030  BNP 115.1*   Basic  Metabolic Panel: Recent Labs  Lab 05/18/24 0030 05/18/24 0550 05/19/24 0550 05/20/24 0533 05/21/24 0426  NA 135 136 140 136 135  K 3.1* 3.4* 3.8 4.1 4.1  CL 93* 95* 94* 92* 95*  CO2 29 23 32 34* 31  GLUCOSE 162* 249* 140* 106* 121*  BUN 16 15 20  31* 32*  CREATININE 1.07* 1.21* 1.02* 1.03* 1.01*  CALCIUM  8.6* 8.4* 8.9 9.0 9.0  MG  --   --  2.6* 2.3 2.2   Liver Function Tests: Recent Labs  Lab 05/18/24 0030  AST 17  ALT 11  ALKPHOS 79  BILITOT 0.7  PROT 6.6  ALBUMIN 3.4*   No results for input(s): LIPASE, AMYLASE in the last 168 hours. No results for input(s): AMMONIA in the last 168 hours. CBC: Recent Labs  Lab 05/18/24 0030 05/18/24 0550 05/19/24 0550 05/20/24 0533 05/21/24 0426  WBC 14.7* 13.4* 16.0* 18.7* 20.5*  NEUTROABS 9.6*  --   --   --  15.9*  HGB 14.2 13.3 13.2 13.4 13.7  HCT 45.1 42.8 41.6 41.8 42.4  MCV 92.0 91.6 90.2 89.3 89.1  PLT 293 280 292 309 325   Cardiac Enzymes: No results for input(s): CKTOTAL, CKMB, CKMBINDEX, TROPONINI in the last 168 hours. BNP: Invalid input(s): POCBNP CBG: No results for input(s): GLUCAP in the last 168 hours. D-Dimer No results for input(s): DDIMER in the last 72 hours. Hgb A1c No results for input(s): HGBA1C in the last 72 hours. Lipid Profile No results for input(s): CHOL, HDL, LDLCALC, TRIG, CHOLHDL, LDLDIRECT in the last 72 hours. Thyroid function studies No  results for input(s): TSH, T4TOTAL, T3FREE, THYROIDAB in the last 72 hours.  Invalid input(s): FREET3 Anemia work up No results for input(s): VITAMINB12, FOLATE, FERRITIN, TIBC, IRON, RETICCTPCT in the last 72 hours. Urinalysis    Component Value Date/Time   COLORURINE YELLOW 07/20/2021 2253   APPEARANCEUR HAZY (A) 07/20/2021 2253   LABSPEC 1.019 07/20/2021 2253   PHURINE 5.0 07/20/2021 2253   GLUCOSEU NEGATIVE 07/20/2021 2253   HGBUR NEGATIVE 07/20/2021 2253   HGBUR negative 03/25/2009  0943   BILIRUBINUR NEGATIVE 07/20/2021 2253   KETONESUR NEGATIVE 07/20/2021 2253   PROTEINUR NEGATIVE 07/20/2021 2253   UROBILINOGEN 0.2 03/09/2014 2122   NITRITE NEGATIVE 07/20/2021 2253   LEUKOCYTESUR NEGATIVE 07/20/2021 2253   Sepsis Labs Recent Labs  Lab 05/18/24 0550 05/19/24 0550 05/20/24 0533 05/21/24 0426  WBC 13.4* 16.0* 18.7* 20.5*   Microbiology Recent Results (from the past 240 hours)  Blood culture (routine x 2)     Status: None (Preliminary result)   Collection Time: 05/18/24 12:26 AM   Specimen: BLOOD  Result Value Ref Range Status   Specimen Description BLOOD BLOOD RIGHT ARM  Final   Special Requests   Final    BOTTLES DRAWN AEROBIC AND ANAEROBIC Blood Culture adequate volume   Culture   Final    NO GROWTH 3 DAYS Performed at [facility], [address] [place], [place] [address]    Report Status PENDING  Incomplete  Resp panel by RT-PCR (RSV, Flu A&B, Covid)     Status: None   Collection Time: 05/18/24 12:27 AM   Specimen: Nasal Swab  Result Value Ref Range Status   SARS Coronavirus 2 by RT PCR NEGATIVE NEGATIVE Final   Influenza A by PCR NEGATIVE NEGATIVE Final   Influenza B by PCR NEGATIVE NEGATIVE Final    Comment: (NOTE) The Xpert Xpress SARS-CoV-2/FLU/RSV plus assay is intended as an aid in the diagnosis of influenza from Nasopharyngeal swab specimens and should not be used as a sole basis for treatment. Nasal washings and aspirates are unacceptable for Xpert Xpress SARS-CoV-2/FLU/RSV testing.  Fact Sheet for Patients: BloggerCourse.com  Fact Sheet for Healthcare Providers: SeriousBroker.it  This test is not yet approved or cleared by the [place]  FDA and has been authorized for detection and/or diagnosis of SARS-CoV-2 by FDA under an Emergency Use Authorization (EUA). This EUA will remain in effect (meaning this test can be used) for the duration of the COVID-19 declaration  under Section 564(b)(1) of the Act, 21 U.S.C. section 360bbb-3(b)(1), unless the authorization is terminated or revoked.     Resp Syncytial Virus by PCR NEGATIVE NEGATIVE Final    Comment: (NOTE) Fact Sheet for Patients: BloggerCourse.com  Fact Sheet for Healthcare Providers: SeriousBroker.it  This test is not yet approved or cleared by the [place]  FDA and has been authorized for detection and/or diagnosis of SARS-CoV-2 by FDA under an Emergency Use Authorization (EUA). This EUA will remain in effect (meaning this test can be used) for the duration of the COVID-19 declaration under Section 564(b)(1) of the Act, 21 U.S.C. section 360bbb-3(b)(1), unless the authorization is terminated or revoked.  Performed at [facility], [address] [place], [place] [address]   Blood culture (routine x 2)     Status: None (Preliminary result)   Collection Time: 05/18/24 12:31 AM   Specimen: BLOOD  Result Value Ref Range Status   Specimen Description BLOOD BLOOD LEFT ARM  Final   Special Requests   Final  BOTTLES DRAWN AEROBIC AND ANAEROBIC Blood Culture adequate volume   Culture   Final    NO GROWTH 3 DAYS Performed at [facility], [address] [place], [place] [address]    Report Status PENDING  Incomplete  Respiratory (~20 pathogens) panel by PCR     Status: Abnormal   Collection Time: 05/18/24  6:15 PM   Specimen: Nasopharyngeal Swab; Respiratory  Result Value Ref Range Status   Adenovirus NOT DETECTED NOT DETECTED Final   Coronavirus 229E NOT DETECTED NOT DETECTED Final   Coronavirus HKU1 NOT DETECTED NOT DETECTED Final   Coronavirus NL63 NOT DETECTED NOT DETECTED Final   Coronavirus OC43 NOT DETECTED NOT DETECTED Final   Metapneumovirus NOT DETECTED NOT DETECTED Final   Rhinovirus / Enterovirus DETECTED (A) NOT DETECTED Final   Influenza A NOT DETECTED NOT DETECTED Final   Influenza B NOT DETECTED NOT  DETECTED Final   Parainfluenza Virus 1 NOT DETECTED NOT DETECTED Final   Parainfluenza Virus 2 NOT DETECTED NOT DETECTED Final   Parainfluenza Virus 3 NOT DETECTED NOT DETECTED Final   Parainfluenza Virus 4 NOT DETECTED NOT DETECTED Final   Respiratory Syncytial Virus NOT DETECTED NOT DETECTED Final   Bordetella pertussis NOT DETECTED NOT DETECTED Final   Bordetella Parapertussis NOT DETECTED NOT DETECTED Final   Chlamydophila pneumoniae NOT DETECTED NOT DETECTED Final   Mycoplasma pneumoniae NOT DETECTED NOT DETECTED Final    Comment: Performed at [facility], [address], [place], [place] [address]  Expectorated Sputum Assessment w Gram Stain, Rflx to Resp Cult     Status: None   Collection Time: 05/18/24  6:30 PM   Specimen: Sputum  Result Value Ref Range Status   Specimen Description SPUTUM  Final   Special Requests NONE  Final   Sputum evaluation   Final    THIS SPECIMEN IS ACCEPTABLE FOR SPUTUM CULTURE Performed at [facility], [address], [place], [place] [address]    Report Status 05/18/2024 FINAL  Final  Culture, Respiratory w Gram Stain     Status: None (Preliminary result)   Collection Time: 05/18/24  6:30 PM   Specimen: SPU  Result Value Ref Range Status   Specimen Description SPUTUM  Final   Special Requests NONE Reflexed from [id number]  Final   Gram Stain   Final    FEW WBC PRESENT, PREDOMINANTLY PMN FEW GRAM POSITIVE COCCI RARE GRAM NEGATIVE RODS    Culture   Final    CULTURE REINCUBATED FOR BETTER GROWTH Performed at [facility], [address], [place], [place] [address]    Report Status PENDING  Incomplete    FURTHER DISCHARGE INSTRUCTIONS:   Get Medicines reviewed and adjusted: Please take all your medications with you for your next visit with your Primary MD   Laboratory/radiological data: Please request your Primary MD to go over all hospital tests and procedure/radiological results at the follow up, please ask your Primary  MD to get all Hospital records sent to his/her office.   In some cases, they will be blood work, cultures and biopsy results pending at the time of your discharge. Please request that your primary care M.D. goes through all the records of your hospital data and follows up on these results.   Also Note the following: If you experience worsening of your admission symptoms, develop shortness of breath, life threatening emergency, suicidal or homicidal thoughts you must seek medical attention immediately by calling 911 or calling  your MD immediately  if symptoms less severe.   You must read complete instructions/literature along with all the possible adverse reactions/side effects for all the Medicines you take and that have been prescribed to you. Take any new Medicines after you have completely understood and accpet all the possible adverse reactions/side effects.    patient was instructed, not to drive, operate heavy machinery, perform activities at heights, swimming or participation in water  activities or provide baby-sitting services while on Pain, Sleep and Anxiety Medications; until their outpatient Physician has advised to do so again. Also recommended to not to take more than prescribed Pain, Sleep and Anxiety Medications.  It is not advisable to combine anxiety, sleep and pain medications without talking with your primary care provider.     Wear Seat belts while driving.   Please note: You were cared for by a hospitalist during your hospital stay. Once you are discharged, your primary care physician will handle any further medical issues. Please note that NO REFILLS for any discharge medications will be authorized once you are discharged, as it is imperative that you return to your primary care physician (or establish a relationship with a primary care physician if you do not have one) for your post hospital discharge needs so that they can reassess your need for medications and monitor your lab  values  Time coordinating discharge: Over 30 minutes  SIGNED:   Fredia Skeeter, MD  [facility] 05/21/2024, 1:04 PM *Please note that this is a verbal dictation therefore any spelling or grammatical errors are due to the Dragon Medical One system interpretation. If 7PM-7AM, please contact night-coverage [email or web address]

## 2024-05-21 NOTE — Telephone Encounter (Signed)
 Please schedule patient for hospital follow up with me or an APP in 2 weeks for COPD exacerbation. Ok to use a blocked slot for me.  JD

## 2024-05-22 ENCOUNTER — Telehealth: Payer: Self-pay

## 2024-05-22 NOTE — Telephone Encounter (Signed)
 Called PT no answer left VM.

## 2024-05-22 NOTE — Transitions of Care (Post Inpatient/ED Visit) (Signed)
   05/22/2024  Name: [name] MRN: [id number] DOB: 1956-06-12  Today's TOC FU Call Status: Today's TOC FU Call Status:: Unsuccessful Call (1st Attempt) Unsuccessful Call (1st Attempt) Date: 05/22/24  Attempted to reach the patient regarding the most recent Inpatient/ED visit.  Follow Up Plan: Additional outreach attempts will be made to reach the patient to complete the Transitions of Care (Post Inpatient/ED visit) call.   Rosaline Bohr, NP is listed as her PCP but she has never seen her   Signature  Slater Diesel, RN

## 2024-05-23 LAB — CULTURE, BLOOD (ROUTINE X 2)
Culture: NO GROWTH
Culture: NO GROWTH
Special Requests: ADEQUATE
Special Requests: ADEQUATE

## 2024-05-23 NOTE — Telephone Encounter (Signed)
 2nd attempt called pt no answer left Vm sent LTR.

## 2024-05-26 ENCOUNTER — Telehealth: Payer: Self-pay

## 2024-05-26 NOTE — Transitions of Care (Post Inpatient/ED Visit) (Signed)
   05/26/2024  Name: [name] MRN: [id number] DOB: 1955-11-03  Today's TOC FU Call Status: Today's TOC FU Call Status:: Unsuccessful Call (2nd Attempt) Unsuccessful Call (1st Attempt) Date: 05/22/24 Unsuccessful Call (2nd Attempt) Date: 05/26/24  Attempted to reach the patient regarding the most recent Inpatient/ED visit.  Follow Up Plan: Additional outreach attempts will be made to reach the patient to complete the Transitions of Care (Post Inpatient/ED visit) call.   Signature  Slater Diesel, RN

## 2024-05-26 NOTE — Telephone Encounter (Signed)
 Attempted to call patient. Left voicemail for patient to call office back to get schueduled for hospital follow up.

## 2024-05-27 ENCOUNTER — Telehealth: Payer: Self-pay

## 2024-05-27 NOTE — Transitions of Care (Post Inpatient/ED Visit) (Signed)
   05/27/2024  Name: [name] MRN: [id number] DOB: [birth year]  Today's TOC FU Call Status: Today's TOC FU Call Status:: Unsuccessful Call (3rd Attempt) Unsuccessful Call (1st Attempt) Date: 05/22/24 Unsuccessful Call (2nd Attempt) Date: 05/26/24 Unsuccessful Call (3rd Attempt) Date: 05/27/24  Attempted to reach the patient regarding the most recent Inpatient/ED visit.  Follow Up Plan: No further outreach attempts will be made at this time. We have been unable to contact the patient.  Rosaline Bohr, NP is listed as PCP but the patient has never seen her at [facility]  Slater Diesel, RN

## 2024-06-12 ENCOUNTER — Encounter: Payer: Self-pay | Admitting: Pulmonary Disease

## 2024-06-12 ENCOUNTER — Ambulatory Visit: Admitting: Pulmonary Disease

## 2024-06-12 VITALS — BP 119/82 | HR 70 | Ht 66.0 in | Wt 185.0 lb

## 2024-06-12 DIAGNOSIS — J449 Chronic obstructive pulmonary disease, unspecified: Secondary | ICD-10-CM | POA: Diagnosis not present

## 2024-06-12 DIAGNOSIS — B348 Other viral infections of unspecified site: Secondary | ICD-10-CM

## 2024-06-12 DIAGNOSIS — J9601 Acute respiratory failure with hypoxia: Secondary | ICD-10-CM

## 2024-06-12 DIAGNOSIS — G4734 Idiopathic sleep related nonobstructive alveolar hypoventilation: Secondary | ICD-10-CM | POA: Diagnosis not present

## 2024-06-12 DIAGNOSIS — F17210 Nicotine dependence, cigarettes, uncomplicated: Secondary | ICD-10-CM

## 2024-06-12 LAB — COMPREHENSIVE METABOLIC PANEL WITH GFR
ALT: 13 U/L (ref 0–35)
AST: 17 U/L (ref 0–37)
Albumin: 3.8 g/dL (ref 3.5–5.2)
Alkaline Phosphatase: 79 U/L (ref 39–117)
BUN: 15 mg/dL (ref 6–23)
CO2: 35 meq/L — ABNORMAL HIGH (ref 19–32)
Calcium: 9.5 mg/dL (ref 8.4–10.5)
Chloride: 96 meq/L (ref 96–112)
Creatinine, Ser: 0.98 mg/dL (ref 0.40–1.20)
GFR: 59.57 mL/min — ABNORMAL LOW (ref >60.00)
Glucose, Bld: 88 mg/dL (ref 70–99)
Potassium: 3.3 meq/L — ABNORMAL LOW (ref 3.5–5.1)
Sodium: 139 meq/L (ref 135–145)
Total Bilirubin: 0.4 mg/dL (ref 0.2–1.2)
Total Protein: 7 g/dL (ref 6.0–8.3)

## 2024-06-12 LAB — CBC WITH DIFFERENTIAL/PLATELET
Basophils Absolute: 0.1 K/uL (ref 0.0–0.1)
Basophils Relative: 0.9 % (ref 0.0–3.0)
Eosinophils Absolute: 0.2 K/uL (ref 0.0–0.7)
Eosinophils Relative: 2.6 % (ref 0.0–5.0)
HCT: 42.5 % (ref 36.0–46.0)
Hemoglobin: 14 g/dL (ref 12.0–15.0)
Lymphocytes Relative: 48.2 % — ABNORMAL HIGH (ref 12.0–46.0)
Lymphs Abs: 2.8 K/uL (ref 0.7–4.0)
MCHC: 32.8 g/dL (ref 30.0–36.0)
MCV: 87.5 fl (ref 78.0–100.0)
Monocytes Absolute: 0.4 K/uL (ref 0.1–1.0)
Monocytes Relative: 6.1 % (ref 3.0–12.0)
Neutro Abs: 2.4 K/uL (ref 1.4–7.7)
Neutrophils Relative %: 42.2 % — ABNORMAL LOW (ref 43.0–77.0)
Platelets: 389 K/uL (ref 150.0–400.0)
RBC: 4.86 Mil/uL (ref 3.87–5.11)
RDW: 14.2 % (ref 11.5–15.5)
WBC: 5.8 K/uL (ref 4.0–10.5)

## 2024-06-12 LAB — MAGNESIUM: Magnesium: 2.5 mg/dL (ref 1.5–2.5)

## 2024-06-12 MED ORDER — SYMBICORT 80-4.5 MCG/ACT IN AERO
2.0000 | INHALATION_SPRAY | Freq: Two times a day (BID) | RESPIRATORY_TRACT | 11 refills | Status: AC | PRN
Start: 2024-06-12 — End: ?

## 2024-06-12 MED ORDER — ALBUTEROL SULFATE HFA 108 (90 BASE) MCG/ACT IN AERS: 1.0000 | INHALATION_SPRAY | Freq: Four times a day (QID) | RESPIRATORY_TRACT | 11 refills | Status: AC | PRN

## 2024-06-12 NOTE — Patient Instructions (Addendum)
 Continue symbicort  2 puffs twice daily - rinse mouth out after each use  Use albuterol  inhaler 1-2 puffs every 4-6 hours as needed  Recommend quitting smoking by using nicotine  replacement therapy - use mini nicotine  lozenges 2mg  as needed - use nicotine  patch 7mg  daily  Continue oxygen  at night time  We will schedule you for pulmonary function tests at front desk  We will check labs today  Follow up in 6 months

## 2024-06-12 NOTE — Progress Notes (Unsigned)
 Subjective:   PATIENT ID: [name] GENDER: female DOB: 03/29/1956, MRN: [id number]   HPI Discussed the use of AI scribe software for clinical note transcription with the patient, who gave verbal consent to proceed.  History of Present Illness   [name] is a 68 year old woman, daily smoker with history of COPD, nocturnal hypoxemia and hypertension who comes to pulmonary clinic for hospital follow up.  She was recently admitted for acute hypoxemic respiratory failure due to COPD exacerbation from rhinovirus infection. She was weaned off oxygen  prior to discharge.   She is currently using symbicort  80-4.49mcg 2 puffs twice daily and as needed albuterol .   Her breathing has been doing well since discharge. She has cough with clear phlegm. She reports having abdominal muscle spasms that started over recent days.   She has been smoking since age 67, currently about five cigarettes a day, and is attempting to quit by her birthday next month. She previously quit smoking for a year following a nervous breakdown. She has been smoking for 35 years, typically half a pack a day.       Past Medical History:  Diagnosis Date   COPD with asthma (HCC)    currently followed by pcp (previous as seen pulmonogist --- dr byrum lov note in epic 10-04-2017),  last exacerbation with pneumonia 04/ 2022 pt stated completed antibiotic , no residual)   DOE (dyspnea on exertion)    02-15-2021  per pt only with hills/ stairs , no issue with other activities   History of hyperthyroidism    s/p  RAI 12/ 2013,  followed by pcp   Hypertension    Nocturnal hypoxemia    uses 2L oxygen  at night only,  (02-15-2021 pt stated O2 sat can get down 70--88%,  stated during the day O2 sat on RA are normal)   Nocturnal leg cramps    occasional   RLS (restless legs syndrome)    Wears dentures    upper     Family History  Problem Relation Age of Onset   Kidney failure Mother    Heart failure Mother    Heart  attack Father    Heart attack Brother      Social History   Socioeconomic History   Marital status: Legally Separated    Spouse name: Not on file   Number of children: 2   Years of education: 12   Highest education level: Not on file  Occupational History   Occupation: home health aid   Tobacco Use   Smoking status: Some Days    Types: Cigarettes   Smokeless tobacco: Never   Tobacco comments:    02-15-2021  per pt 1ppwk , down from 1ppd  Vaping Use   Vaping status: Never Used  Substance and Sexual Activity   Alcohol use: No   Drug use: Never   Sexual activity: Not on file  Other Topics Concern   Not on file  Social History Narrative   Patient drinks about 1 soda daily.   Patient is right handed.    Social Drivers of Corporate investment banker Strain: Not on file  Food Insecurity: No Food Insecurity (05/18/2024)   Hunger Vital Sign    Worried About Running Out of Food in the Last Year: Never true    Ran Out of Food in the Last Year: Never true  Transportation Needs: No Transportation Needs (05/18/2024)   PRAPARE - Administrator, Civil Service (Medical):  No    Lack of Transportation (Non-Medical): No  Physical Activity: Not on file  Stress: Not on file  Social Connections: Moderately Integrated (05/18/2024)   Social Connection and Isolation Panel    Frequency of Communication with Friends and Family: More than three times a week    Frequency of Social Gatherings with Friends and Family: More than three times a week    Attends Religious Services: More than 4 times per year    Active Member of [employer] or Organizations: Yes    Attends Banker Meetings: More than 4 times per year    Marital Status: Separated  Intimate Partner Violence: Not At Risk (05/18/2024)   Humiliation, Afraid, Rape, and Kick questionnaire    Fear of Current or Ex-Partner: No    Emotionally Abused: No    Physically Abused: No    Sexually Abused: No     Allergies  Allergen  Reactions   Trazodone  And Nefazodone Other (See Comments)    Suicidal thoughts, hallucinations.     Outpatient Medications Prior to Visit  Medication Sig Dispense Refill   albuterol  (PROVENTIL ) (5 MG/ML) 0.5% nebulizer solution Take 0.5 mLs (2.5 mg total) by nebulization every 4 (four) hours while awake for 3 days, THEN 0.5 mLs (2.5 mg total) every 4 (four) hours as needed for wheezing or shortness of breath. (Patient taking differently: Inhale 0.5 mLs (2.5 mg total) every 4 (four) hours as needed for wheezing or shortness of breath.) 40 mL 0   aspirin  81 MG chewable tablet Chew 81 mg by mouth every morning.     Calcium -Phosphorus-Vitamin D (CALCIUM /VITAMIN D3/ADULT GUMMY PO) Take 2 each by mouth every morning.     clonazePAM (KLONOPIN) 0.5 MG tablet Take by mouth.     escitalopram  (LEXAPRO ) 10 MG tablet Take 10 mg by mouth at bedtime.     fluticasone  (FLONASE ) 50 MCG/ACT nasal spray Place 2 sprays into both nostrils daily as needed for allergies or rhinitis.     hydrochlorothiazide  (HYDRODIURIL ) 25 MG tablet Take 25 mg by mouth every morning.     ibuprofen (ADVIL) 800 MG tablet Take 800 mg by mouth every 8 (eight) hours as needed for moderate pain.     levocetirizine (XYZAL) 5 MG tablet Take 5 mg by mouth daily.     Multiple Vitamins-Minerals (ADULT ONE DAILY GUMMIES) CHEW Chew 2 each by mouth every morning.     omeprazole (PRILOSEC) 40 MG capsule Take 40 mg by mouth 2 (two) times daily as needed (heartburn, indigestion).     OXYGEN  Inhale 3 L into the lungs 2 (two) times daily as needed (Respiratory symptoms).     albuterol  (PROVENTIL  HFA;VENTOLIN  HFA) 108 (90 BASE) MCG/ACT inhaler Inhale 2 puffs into the lungs every 6 (six) hours as needed for wheezing or shortness of breath.     SYMBICORT  80-4.5 MCG/ACT inhaler Inhale 2 puffs into the lungs 2 (two) times daily as needed (Respiratory symptoms).     No facility-administered medications prior to visit.    Review of Systems  Constitutional:   Negative for chills, fever, malaise/fatigue and weight loss.  HENT:  Negative for congestion, sinus pain and sore throat.   Eyes: Negative.   Respiratory:  Positive for cough and shortness of breath. Negative for hemoptysis, sputum production and wheezing.   Cardiovascular:  Negative for chest pain, palpitations, orthopnea, claudication and leg swelling.  Gastrointestinal:  Negative for abdominal pain, heartburn, nausea and vomiting.  Genitourinary: Negative.   Musculoskeletal:  Negative for joint  pain and myalgias.  Skin:  Negative for rash.  Neurological:  Negative for weakness.  Endo/Heme/Allergies: Negative.   Psychiatric/Behavioral: Negative.      Objective:   Vitals:   06/12/24 1010  BP: 119/82  Pulse: 70  SpO2: 94%  Weight: 185 lb (83.9 kg)  Height: 5' 6 (1.676 m)   Physical Exam Constitutional:      General: She is not in acute distress.    Appearance: Normal appearance.  Eyes:     General: No scleral icterus.    Conjunctiva/sclera: Conjunctivae normal.  Cardiovascular:     Rate and Rhythm: Normal rate and regular rhythm.  Pulmonary:     Breath sounds: No wheezing, rhonchi or rales.  Musculoskeletal:     Right lower leg: No edema.     Left lower leg: No edema.  Skin:    General: Skin is warm and dry.  Neurological:     General: No focal deficit present.    CBC    Component Value Date/Time   WBC 5.8 06/12/2024 1122   RBC 4.86 06/12/2024 1122   HGB 14.0 06/12/2024 1122   HCT 42.5 06/12/2024 1122   PLT 389.0 06/12/2024 1122   MCV 87.5 06/12/2024 1122   MCH 28.8 05/21/2024 0426   MCHC 32.8 06/12/2024 1122   RDW 14.2 06/12/2024 1122   LYMPHSABS 2.8 06/12/2024 1122   MONOABS 0.4 06/12/2024 1122   EOSABS 0.2 06/12/2024 1122   BASOSABS 0.1 06/12/2024 1122      Latest Ref Rng & Units 06/12/2024   11:22 AM 05/21/2024    4:26 AM 05/20/2024    5:33 AM  BMP  Glucose 70 - 99 mg/dL 88  878  893   BUN 6 - 23 mg/dL 15  32  31   Creatinine 0.40 - 1.20 mg/dL  9.01  8.98  8.96   Sodium 135 - 145 mEq/L 139  135  136   Potassium 3.5 - 5.1 mEq/L 3.3  4.1  4.1   Chloride 96 - 112 mEq/L 96  95  92   CO2 19 - 32 mEq/L 35  31  34   Calcium  8.4 - 10.5 mg/dL 9.5  9.0  9.0    Chest imaging: CXR 05/18/24 Mild cardiomegaly, vascular congestion. No overt edema, confluent opacities or effusions. No acute bony abnormality.  PFT:     No data to display          Labs:  Path:  Echo:  Heart Catheterization:    Assessment & Plan:   Chronic obstructive pulmonary disease, unspecified COPD type (HCC) - Plan: Pulmonary Function Test, CBC with Differential/Platelet, Comp Met (CMET), Magnesium , albuterol  (VENTOLIN  HFA) 108 (90 Base) MCG/ACT inhaler, SYMBICORT  80-4.5 MCG/ACT inhaler, Magnesium , CBC with Differential/Platelet, Comp Met (CMET), Comp Met (CMET), CBC with Differential/Platelet, Magnesium   Acute respiratory failure with hypoxia (HCC)  Rhinovirus infection  Nocturnal hypoxemia  Cigarette smoker - Plan: Ambulatory Referral for Lung Cancer Scre Assessment and Plan    Chronic obstructive pulmonary disease (COPD) COPD with persistent cough and phlegm. Nighttime oxygen  at 3L, occasional daytime use. Advised scheduled inhaler use for optimal lung function. - Advise Symbicort  two puffs twice daily. - Use albuterol  inhaler as needed. - Schedule pulmonary function tests. - Check labs for electrolyte balance. - Provide inhaler and nebulizer refills.  Tobacco use disorder Long-standing tobacco use, currently five cigarettes daily. Motivated to quit by next month, has reduced smoking. Smoking cessation discussed for 3 minutes. - Start 7 mg nicotine  patch daily. -  Use mini nicotine  lozenges as needed. - Encourage non-smoking activities like walking and music. - Refer for lung cancer screening with CT scan.  Muscle spasms with weakness, under evaluation Episodes of muscle spasms and weakness, no pain. Referred to neurologist. - Check labs for  electrolyte balance. - Follow up with neurologist referral.   Follow up in 6 months  Dorn Chill, MD [facility] Office: [phone]   Current Outpatient Medications:    albuterol  (PROVENTIL ) (5 MG/ML) 0.5% nebulizer solution, Take 0.5 mLs (2.5 mg total) by nebulization every 4 (four) hours while awake for 3 days, THEN 0.5 mLs (2.5 mg total) every 4 (four) hours as needed for wheezing or shortness of breath. (Patient taking differently: Inhale 0.5 mLs (2.5 mg total) every 4 (four) hours as needed for wheezing or shortness of breath.), Disp: 40 mL, Rfl: 0   aspirin  81 MG chewable tablet, Chew 81 mg by mouth every morning., Disp: , Rfl:    Calcium -Phosphorus-Vitamin D (CALCIUM /VITAMIN D3/ADULT GUMMY PO), Take 2 each by mouth every morning., Disp: , Rfl:    clonazePAM (KLONOPIN) 0.5 MG tablet, Take by mouth., Disp: , Rfl:    escitalopram  (LEXAPRO ) 10 MG tablet, Take 10 mg by mouth at bedtime., Disp: , Rfl:    fluticasone  (FLONASE ) 50 MCG/ACT nasal spray, Place 2 sprays into both nostrils daily as needed for allergies or rhinitis., Disp: , Rfl:    hydrochlorothiazide  (HYDRODIURIL ) 25 MG tablet, Take 25 mg by mouth every morning., Disp: , Rfl:    ibuprofen (ADVIL) 800 MG tablet, Take 800 mg by mouth every 8 (eight) hours as needed for moderate pain., Disp: , Rfl:    levocetirizine (XYZAL) 5 MG tablet, Take 5 mg by mouth daily., Disp: , Rfl:    Multiple Vitamins-Minerals (ADULT ONE DAILY GUMMIES) CHEW, Chew 2 each by mouth every morning., Disp: , Rfl:    omeprazole (PRILOSEC) 40 MG capsule, Take 40 mg by mouth 2 (two) times daily as needed (heartburn, indigestion)., Disp: , Rfl:    OXYGEN , Inhale 3 L into the lungs 2 (two) times daily as needed (Respiratory symptoms)., Disp: , Rfl:    albuterol  (VENTOLIN  HFA) 108 (90 Base) MCG/ACT inhaler, Inhale 1-2 puffs into the lungs every 6 (six) hours as needed for wheezing or shortness of breath., Disp: 8 g, Rfl: 11   SYMBICORT   80-4.5 MCG/ACT inhaler, Inhale 2 puffs into the lungs 2 (two) times daily as needed (Respiratory symptoms)., Disp: 1 each, Rfl: 11

## 2024-06-13 ENCOUNTER — Encounter: Payer: Self-pay | Admitting: Pulmonary Disease

## 2024-06-13 ENCOUNTER — Ambulatory Visit: Payer: Self-pay | Admitting: Pulmonary Disease

## 2024-06-13 NOTE — Progress Notes (Signed)
 Please let patient know her potassium level is low and could be contributing to muscle spasms. Recommend she eat potassium rich foods such as banana, avocado, spinach, tomato. She should follow up with her primary for a recheck in the future.

## 2024-06-13 NOTE — Telephone Encounter (Signed)
 Spoke with patient verbalized understanding   NFN
